# Patient Record
Sex: Female | Born: 1945 | Race: White | Hispanic: No | Smoking: Never smoker
Health system: Southern US, Community
[De-identification: ages and names within clinical notes are randomized; demographics above are authoritative.]

## PROBLEM LIST (undated history)

## (undated) DIAGNOSIS — I4819 Other persistent atrial fibrillation: Secondary | ICD-10-CM

## (undated) DIAGNOSIS — D539 Nutritional anemia, unspecified: Secondary | ICD-10-CM

## (undated) DIAGNOSIS — I10 Essential (primary) hypertension: Secondary | ICD-10-CM

## (undated) DIAGNOSIS — F419 Anxiety disorder, unspecified: Secondary | ICD-10-CM

## (undated) DIAGNOSIS — M48061 Spinal stenosis, lumbar region without neurogenic claudication: Secondary | ICD-10-CM

## (undated) DIAGNOSIS — I5189 Other ill-defined heart diseases: Secondary | ICD-10-CM

## (undated) DIAGNOSIS — E785 Hyperlipidemia, unspecified: Secondary | ICD-10-CM

## (undated) DIAGNOSIS — I499 Cardiac arrhythmia, unspecified: Secondary | ICD-10-CM

## (undated) DIAGNOSIS — E119 Type 2 diabetes mellitus without complications: Secondary | ICD-10-CM

## (undated) DIAGNOSIS — R339 Retention of urine, unspecified: Secondary | ICD-10-CM

## (undated) DIAGNOSIS — G4730 Sleep apnea, unspecified: Secondary | ICD-10-CM

## (undated) DIAGNOSIS — K219 Gastro-esophageal reflux disease without esophagitis: Secondary | ICD-10-CM

## (undated) DIAGNOSIS — I5032 Chronic diastolic (congestive) heart failure: Secondary | ICD-10-CM

## (undated) DIAGNOSIS — I4891 Unspecified atrial fibrillation: Secondary | ICD-10-CM

## (undated) HISTORY — DX: Hyperlipidemia, unspecified: E78.5

## (undated) HISTORY — PX: BREAST SURGERY: SHX581

## (undated) HISTORY — DX: Nutritional anemia, unspecified: D53.9

## (undated) HISTORY — PX: CHOLECYSTECTOMY: SHX55

## (undated) HISTORY — DX: Other ill-defined heart diseases: I51.89

## (undated) HISTORY — DX: Chronic diastolic (congestive) heart failure: I50.32

## (undated) HISTORY — DX: Essential (primary) hypertension: I10

## (undated) HISTORY — DX: Gastro-esophageal reflux disease without esophagitis: K21.9

## (undated) HISTORY — DX: Other persistent atrial fibrillation: I48.19

## (undated) HISTORY — DX: Anxiety disorder, unspecified: F41.9

## (undated) HISTORY — PX: APPENDECTOMY: SHX54

## (undated) HISTORY — DX: Type 2 diabetes mellitus without complications: E11.9

---

## 2019-04-13 DIAGNOSIS — G8929 Other chronic pain: Secondary | ICD-10-CM | POA: Diagnosis not present

## 2019-04-13 DIAGNOSIS — G629 Polyneuropathy, unspecified: Secondary | ICD-10-CM | POA: Diagnosis not present

## 2019-04-13 DIAGNOSIS — M549 Dorsalgia, unspecified: Secondary | ICD-10-CM | POA: Diagnosis not present

## 2019-04-13 DIAGNOSIS — M150 Primary generalized (osteo)arthritis: Secondary | ICD-10-CM | POA: Diagnosis not present

## 2019-08-20 DIAGNOSIS — E119 Type 2 diabetes mellitus without complications: Secondary | ICD-10-CM | POA: Diagnosis not present

## 2019-08-20 DIAGNOSIS — Z760 Encounter for issue of repeat prescription: Secondary | ICD-10-CM | POA: Diagnosis not present

## 2019-08-20 DIAGNOSIS — G629 Polyneuropathy, unspecified: Secondary | ICD-10-CM | POA: Diagnosis not present

## 2019-09-10 DIAGNOSIS — Z760 Encounter for issue of repeat prescription: Secondary | ICD-10-CM | POA: Diagnosis not present

## 2019-12-03 DIAGNOSIS — R110 Nausea: Secondary | ICD-10-CM | POA: Diagnosis not present

## 2019-12-03 DIAGNOSIS — R0902 Hypoxemia: Secondary | ICD-10-CM | POA: Diagnosis not present

## 2020-01-24 ENCOUNTER — Ambulatory Visit: Payer: Self-pay | Admitting: Nurse Practitioner

## 2020-02-15 ENCOUNTER — Other Ambulatory Visit: Payer: Self-pay

## 2020-02-15 ENCOUNTER — Encounter: Payer: Self-pay | Admitting: Nurse Practitioner

## 2020-02-15 ENCOUNTER — Ambulatory Visit (INDEPENDENT_AMBULATORY_CARE_PROVIDER_SITE_OTHER): Payer: Medicare PPO | Admitting: Nurse Practitioner

## 2020-02-15 VITALS — BP 140/72 | HR 71 | Temp 97.1°F | Ht 63.0 in | Wt 185.0 lb

## 2020-02-15 DIAGNOSIS — N3281 Overactive bladder: Secondary | ICD-10-CM | POA: Diagnosis not present

## 2020-02-15 DIAGNOSIS — E119 Type 2 diabetes mellitus without complications: Secondary | ICD-10-CM | POA: Diagnosis not present

## 2020-02-15 DIAGNOSIS — Z23 Encounter for immunization: Secondary | ICD-10-CM

## 2020-02-15 DIAGNOSIS — Z9889 Other specified postprocedural states: Secondary | ICD-10-CM

## 2020-02-15 LAB — BAYER DCA HB A1C WAIVED: HB A1C (BAYER DCA - WAIVED): 7.1 % — ABNORMAL HIGH (ref <7.0)

## 2020-02-15 MED ORDER — BLOOD GLUCOSE METER KIT: PACK | 0 refills | Status: DC

## 2020-02-15 MED ORDER — MIRABEGRON ER 25 MG PO TB24: 25.0000 mg | ORAL_TABLET | Freq: Every day | ORAL | 2 refills | Status: DC

## 2020-02-15 NOTE — Patient Instructions (Signed)
Overactive Bladder, Adult  Overactive bladder refers to a condition in which a person has a sudden need to pass urine. The person may leak urine if he or she cannot get to the bathroom fast enough (urinary incontinence). A person with this condition may also wake up several times in the night to go to the bathroom. Overactive bladder is associated with poor nerve signals between your bladder and your brain. Your bladder may get the signal to empty before it is full. You may also have very sensitive muscles that make your bladder squeeze too soon. These symptoms might interfere with daily work or social activities. What are the causes? This condition may be associated with or caused by:  Urinary tract infection.  Infection of nearby tissues, such as the prostate.  Prostate enlargement.  Surgery on the uterus or urethra.  Bladder stones, inflammation, or tumors.  Drinking too much caffeine or alcohol.  Certain medicines, especially medicines that get rid of extra fluid in the body (diuretics).  Muscle or nerve weakness, especially from: ? A spinal cord injury. ? Stroke. ? Multiple sclerosis. ? Parkinson's disease.  Diabetes.  Constipation. What increases the risk? You may be at greater risk for overactive bladder if you:  Are an older adult.  Smoke.  Are going through menopause.  Have prostate problems.  Have a neurological disease, such as stroke, dementia, Parkinson's disease, or multiple sclerosis (MS).  Eat or drink things that irritate the bladder. These include alcohol, spicy food, and caffeine.  Are overweight or obese. What are the signs or symptoms? Symptoms of this condition include:  Sudden, strong urge to urinate.  Leaking urine.  Urinating 8 or more times a day.  Waking up to urinate 2 or more times a night. How is this diagnosed? Your health care provider may suspect overactive bladder based on your symptoms. He or she will diagnose this condition  by:  A physical exam and medical history.  Blood or urine tests. You might need bladder or urine tests to help determine what is causing your overactive bladder. You might also need to see a health care provider who specializes in urinary tract problems (urologist). How is this treated? Treatment for overactive bladder depends on the cause of your condition and whether it is mild or severe. You can also make lifestyle changes at home. Options include:  Bladder training. This may include: ? Learning to control the urge to urinate by following a schedule that directs you to urinate at regular intervals (timed voiding). ? Doing Kegel exercises to strengthen your pelvic floor muscles, which support your bladder. Toning these muscles can help you control urination, even if your bladder muscles are overactive.  Special devices. This may include: ? Biofeedback, which uses sensors to help you become aware of your body's signals. ? Electrical stimulation, which uses electrodes placed inside the body (implanted) or outside the body. These electrodes send gentle pulses of electricity to strengthen the nerves or muscles that control the bladder. ? Women may use a plastic device that fits into the vagina and supports the bladder (pessary).  Medicines. ? Antibiotics to treat bladder infection. ? Antispasmodics to stop the bladder from releasing urine at the wrong time. ? Tricyclic antidepressants to relax bladder muscles. ? Injections of botulinum toxin type A directly into the bladder tissue to relax bladder muscles.  Lifestyle changes. This may include: ? Weight loss. Talk to your health care provider about weight loss methods that would work best for you. ?   Diet changes. This may include reducing how much alcohol and caffeine you consume, or drinking fluids at different times of the day. ? Not smoking. Do not use any products that contain nicotine or tobacco, such as cigarettes and e-cigarettes. If  you need help quitting, ask your health care provider.  Surgery. ? A device may be implanted to help manage the nerve signals that control urination. ? An electrode may be implanted to stimulate electrical signals in the bladder. ? A procedure may be done to change the shape of the bladder. This is done only in very severe cases. Follow these instructions at home: Lifestyle  Make any diet or lifestyle changes that are recommended by your health care provider. These may include: ? Drinking less fluid or drinking fluids at different times of the day. ? Cutting down on caffeine or alcohol. ? Doing Kegel exercises. ? Losing weight if needed. ? Eating a healthy and balanced diet to prevent constipation. This may include:  Eating foods that are high in fiber, such as fresh fruits and vegetables, whole grains, and beans.  Limiting foods that are high in fat and processed sugars, such as fried and sweet foods. General instructions  Take over-the-counter and prescription medicines only as told by your health care provider.  If you were prescribed an antibiotic medicine, take it as told by your health care provider. Do not stop taking the antibiotic even if you start to feel better.  Use any implants or pessary as told by your health care provider.  If needed, wear pads to absorb urine leakage.  Keep a journal or log to track how much and when you drink and when you feel the need to urinate. This will help your health care provider monitor your condition.  Keep all follow-up visits as told by your health care provider. This is important. Contact a health care provider if:  You have a fever.  Your symptoms do not get better with treatment.  Your pain and discomfort get worse.  You have more frequent urges to urinate. Get help right away if:  You are not able to control your bladder. Summary  Overactive bladder refers to a condition in which a person has a sudden need to pass  urine.  Several conditions may lead to an overactive bladder.  Treatment for overactive bladder depends on the cause and severity of your condition.  Follow your health care provider's instructions about lifestyle changes, doing Kegel exercises, keeping a journal, and taking medicines. This information is not intended to replace advice given to you by your health care provider. Make sure you discuss any questions you have with your health care provider. Document Revised: 06/18/2018 Document Reviewed: 03/13/2017 Elsevier Patient Education  [place]. Diabetes Basics  Diabetes (diabetes mellitus) is a long-term (chronic) disease. It occurs when the body does not properly use sugar (glucose) that is released from food after you eat. Diabetes may be caused by one or both of these problems:  Your pancreas does not make enough of a hormone called insulin.  Your body does not react in a normal way to insulin that it makes. Insulin lets sugars (glucose) go into cells in your body. This gives you energy. If you have diabetes, sugars cannot get into cells. This causes high blood sugar (hyperglycemia). Follow these instructions at home: How is diabetes treated? You may need to take insulin or other diabetes medicines daily to keep your blood sugar in balance. Take your diabetes medicines every  day as told by your doctor. List your diabetes medicines here: Diabetes medicines  Name of medicine: ______________________________ ? Amount (dose): _______________ Time (a.m./p.m.): _______________ Notes: ___________________________________  Name of medicine: ______________________________ ? Amount (dose): _______________ Time (a.m./p.m.): _______________ Notes: ___________________________________  Name of medicine: ______________________________ ? Amount (dose): _______________ Time (a.m./p.m.): _______________ Notes: ___________________________________ If you use insulin, you will learn how to  give yourself insulin by injection. You may need to adjust the amount based on the food that you eat. List the types of insulin you use here: Insulin  Insulin type: ______________________________ ? Amount (dose): _______________ Time (a.m./p.m.): _______________ Notes: ___________________________________  Insulin type: ______________________________ ? Amount (dose): _______________ Time (a.m./p.m.): _______________ Notes: ___________________________________  Insulin type: ______________________________ ? Amount (dose): _______________ Time (a.m./p.m.): _______________ Notes: ___________________________________  Insulin type: ______________________________ ? Amount (dose): _______________ Time (a.m./p.m.): _______________ Notes: ___________________________________  Insulin type: ______________________________ ? Amount (dose): _______________ Time (a.m./p.m.): _______________ Notes: ___________________________________ How do I manage my blood sugar?  Check your blood sugar levels using a blood glucose monitor as directed by your doctor. Your doctor will set treatment goals for you. Generally, you should have these blood sugar levels:  Before meals (preprandial): 80-130 mg/dL (4.4-7.2 mmol/L).  After meals (postprandial): below 180 mg/dL (10 mmol/L).  A1c level: less than 7%. Write down the times that you will check your blood sugar levels: Blood sugar checks  Time: _______________ Notes: ___________________________________  Time: _______________ Notes: ___________________________________  Time: _______________ Notes: ___________________________________  Time: _______________ Notes: ___________________________________  Time: _______________ Notes: ___________________________________  Time: _______________ Notes: ___________________________________  What do I need to know about low blood sugar? Low blood sugar is called hypoglycemia. This is when blood sugar is at or below 70  mg/dL (3.9 mmol/L). Symptoms may include:  Feeling: ? Hungry. ? Worried or nervous (anxious). ? Sweaty and clammy. ? Confused. ? Dizzy. ? Sleepy. ? Sick to your stomach (nauseous).  Having: ? A fast heartbeat. ? A headache. ? A change in your vision. ? Tingling or no feeling (numbness) around the mouth, lips, or tongue. ? Jerky movements that you cannot control (seizure).  Having trouble with: ? Moving (coordination). ? Sleeping. ? Passing out (fainting). ? Getting upset easily (irritability). Treating low blood sugar To treat low blood sugar, eat or drink something sugary right away. If you can think clearly and swallow safely, follow the 15:15 rule:  Take 15 grams of a fast-acting carb (carbohydrate). Talk with your doctor about how much you should take.  Some fast-acting carbs are: ? Sugar tablets (glucose pills). Take 3-4 glucose pills. ? 6-8 pieces of hard candy. ? 4-6 oz (120-150 mL) of fruit juice. ? 4-6 oz (120-150 mL) of regular (not diet) soda. ? 1 Tbsp (15 mL) honey or sugar.  Check your blood sugar 15 minutes after you take the carb.  If your blood sugar is still at or below 70 mg/dL (3.9 mmol/L), take 15 grams of a carb again.  If your blood sugar does not go above 70 mg/dL (3.9 mmol/L) after 3 tries, get help right away.  After your blood sugar goes back to normal, eat a meal or a snack within 1 hour. Treating very low blood sugar If your blood sugar is at or below 54 mg/dL (3 mmol/L), you have very low blood sugar (severe hypoglycemia). This is an emergency. Do not wait to see if the symptoms will go away. Get medical help right away. Call your local emergency services (911 in the [place] Do not drive yourself to  the hospital. Questions to ask your health care provider  Do I need to meet with a diabetes educator?  What equipment will I need to care for myself at home?  What diabetes medicines do I need? When should I take them?  How often do I need to  check my blood sugar?  What number can I call if I have questions?  When is my next doctor's visit?  Where can I find a support group for people with diabetes? Where to find more information  American Diabetes Association: www.diabetes.org  American Association of Diabetes Educators: www.diabeteseducator.org/patient-resources Contact a doctor if:  Your blood sugar is at or above 240 mg/dL (13.3 mmol/L) for 2 days in a row.  You have been sick or have had a fever for 2 days or more, and you are not getting better.  You have any of these problems for more than 6 hours: ? You cannot eat or drink. ? You feel sick to your stomach (nauseous). ? You throw up (vomit). ? You have watery poop (diarrhea). Get help right away if:  Your blood sugar is lower than 54 mg/dL (3 mmol/L).  You get confused.  You have trouble: ? Thinking clearly. ? Breathing. Summary  Diabetes (diabetes mellitus) is a long-term (chronic) disease. It occurs when the body does not properly use sugar (glucose) that is released from food after digestion.  Take insulin and diabetes medicines as told.  Check your blood sugar every day, as often as told.  Keep all follow-up visits as told by your doctor. This is important. This information is not intended to replace advice given to you by your health care provider. Make sure you discuss any questions you have with your health care provider. Document Revised: 11/18/2018 Document Reviewed: 05/30/2017 Elsevier Patient Education  [place].

## 2020-02-15 NOTE — Assessment & Plan Note (Signed)
Not controlled on current regimen.  Patient is lethargic with generalized weakness on presentation today.  Patient is on [place].  Help patient is administering medication is not quite understood.  Patient newly [place] a different state.  Patient used to live in a nursing home but now lives with daughter and have to manage medication by herself.  A1c 7.1.  Provided education to patient on medication management.   Patient will follow up in 3 months.

## 2020-02-15 NOTE — Progress Notes (Signed)
New Patient Note  RE: Krystal Ali MRN: [id number] DOB: 08/15/1945 Date of Office Visit: 02/15/2020  Chief Complaint: New Patient (Initial Visit)  History of Present Illness:  The patient presents with history of type 2 diabetes mellitus without complications. Patient was diagnosed few years ago. Compliance with treatment has been fair; the patient takes medication as directed , maintains a diabetic diet and able to exercise due to bilateral lower extremity weakness., follows up as directed , and is keeping a glucose diary.  Patient specifically denies associated symptoms, including blurred vision, fatigue, polydipsia, polyphagia and polyuria . Patient denies hypoglycemia. In regard to preventative care, the patient performs foot self-exams daily and last ophthalmology exam was in : We will schedule an appointment   Concerning patient's overactive bladder.  Patient is reporting lack of control and having more accidents.  Patient has not had medication in the last few months.  Patient is new to the area relocating from another state and has not completely quoted all her medication.  Patient denies dysuria, foul smelling urine, pelvic pain, nausea or vomiting.  Current medication Myrbetriq 25 mg tablet daily.    Assessment and Plan: Krystal Ali is a 74 y.o. female with: Diabetes mellitus without complication ([place]) Not controlled on current regimen.  Patient is lethargic with generalized weakness on presentation today.  Patient is on [place].  Help patient is administering medication is not quite understood.  Patient newly relocated from a different state.  Patient used to live in a nursing home but now lives with daughter and have to manage medication by herself.  A1c 7.1.  Provided education to patient on medication management.   Patient will follow up in 3 months.  Overactive bladder This is not new for patient.  Overactive bladder not well managed.  Really filled Myrbetriq.  Rx  sent to pharmacy provided education to patient with printed handouts given.  Follow-up with worsening or unresolved symptoms.  Return in about 3 weeks (around 03/07/2020) for [place] work.   Diagnostics:   Past Medical History: Patient Active Problem List   Diagnosis Date Noted  . Overactive bladder 02/15/2020  . Diabetes mellitus without complication ([place]) 89/21/1940  . History of back surgery 02/15/2020   Past Medical History:  Diagnosis Date  . Anxiety   . Diabetes mellitus without complication ([place])   . GERD (gastroesophageal reflux disease)   . Hyperlipidemia   . Hypertension    Past Surgical History: Past Surgical History:  Procedure Laterality Date  . APPENDECTOMY    . BREAST SURGERY    . CHOLECYSTECTOMY     Medication List:  Current Outpatient Medications  Medication Sig Dispense Refill  . amLODipine (NORVASC) 5 MG tablet Take 5 mg by mouth daily.    Marland Kitchen atorvastatin (LIPITOR) 20 MG tablet Take 20 mg by mouth daily.    . cetirizine (ZYRTEC) 10 MG tablet Take 10 mg by mouth daily.    . fluticasone (FLONASE) 50 MCG/ACT nasal spray Place into both nostrils.    Marland Kitchen JANUMET XR [phone] MG TB24 Take 1 tablet by mouth daily.    Marland Kitchen OZEMPIC, 0.25 OR 0.5 MG/DOSE, 2 MG/1.5ML SOPN Inject into the skin.    Marland Kitchen venlafaxine XR (EFFEXOR-XR) 150 MG 24 hr capsule      No current facility-administered medications for this visit.   Allergies: Allergies  Allergen Reactions  . Morphine And Related    Social History: Social History   Socioeconomic History  . Marital status: Divorced  Spouse name: Not on file  . Number of children: Not on file  . Years of education: Not on file  . Highest education level: Not on file  Occupational History  . Not on file  Tobacco Use  . Smoking status: Never Smoker  . Smokeless tobacco: Never Used  Substance and Sexual Activity  . Alcohol use: Not Currently  . Drug use: Never  . Sexual activity: Not Currently  Other Topics Concern  .  Not on file  Social History Narrative  . Not on file   Social Determinants of Health   Financial Resource Strain:   . Difficulty of Paying Living Expenses: Not on file  Food Insecurity:   . Worried About Charity fundraiser in the Last Year: Not on file  . Ran Out of Food in the Last Year: Not on file  Transportation Needs:   . Lack of Transportation (Medical): Not on file  . Lack of Transportation (Non-Medical): Not on file  Physical Activity:   . Days of Exercise per Week: Not on file  . Minutes of Exercise per Session: Not on file  Stress:   . Feeling of Stress : Not on file  Social Connections:   . Frequency of Communication with Friends and Family: Not on file  . Frequency of Social Gatherings with Friends and Family: Not on file  . Attends Religious Services: Not on file  . Active Member of Clubs or Organizations: Not on file  . Attends Archivist Meetings: Not on file  . Marital Status: Not on file       Family History: Family History  Problem Relation Age of Onset  . Heart disease Father   . COPD Brother           Review of Systems  Respiratory: Negative for cough, chest tightness and shortness of breath.   Cardiovascular: Positive for leg swelling.  Musculoskeletal: Positive for back pain.  Neurological: Positive for weakness. Negative for light-headedness.  Psychiatric/Behavioral: Negative for self-injury. The patient is nervous/anxious.   All other systems reviewed and are negative.  Objective: BP 140/72   Pulse 71   Temp (!) 97.1 F (36.2 C) (Temporal)   Ht 5\' 3"  (1.6 m)   Wt 185 lb (83.9 kg)   SpO2 97%   BMI 32.77 kg/m  Body mass index is 32.77 kg/m. Physical Exam Vitals reviewed. Exam conducted with a chaperone present (Daughter).  Constitutional:      General: She is awake.     Appearance: She is overweight.  HENT:     Nose: Nose normal.  Eyes:     Conjunctiva/sclera: Conjunctivae normal.  Cardiovascular:     Rate and  Rhythm: Normal rate and regular rhythm.     Pulses: Normal pulses.     Heart sounds: Normal heart sounds.  Pulmonary:     Effort: Pulmonary effort is normal.     Breath sounds: Normal breath sounds.  Abdominal:     General: Bowel sounds are normal.  Genitourinary:    Comments: Overactive bladder Musculoskeletal:        General: Swelling and tenderness present.     Comments: Bilateral lower extremity  Skin:    General: Skin is warm.  Neurological:     Mental Status: She is alert and oriented to person, place, and time.  Psychiatric:        Mood and Affect: Mood normal.        Behavior: Behavior normal. Behavior is cooperative.  The plan was reviewed with the patient/family, and all questions/concerned were addressed.  It was my pleasure to see Krystal Ali today and participate in her care. Please feel free to contact me with any questions or concerns.  Sincerely,  Jac Canavan NP [place]

## 2020-02-15 NOTE — Assessment & Plan Note (Signed)
This is not new for patient.  Overactive bladder not well managed.  Really filled Myrbetriq.  Rx sent to pharmacy provided education to patient with printed handouts given.  Follow-up with worsening or unresolved symptoms.

## 2020-02-18 NOTE — Addendum Note (Signed)
Addended by: [name] on: 02/18/2020 11:31 PM   Modules accepted: Level of Service

## 2020-02-18 NOTE — Progress Notes (Signed)
Corrections made, let me know if anything else needs to be added thank you

## 2020-02-22 ENCOUNTER — Other Ambulatory Visit: Payer: Self-pay | Admitting: Nurse Practitioner

## 2020-02-22 ENCOUNTER — Telehealth: Payer: Self-pay

## 2020-02-22 DIAGNOSIS — Z9889 Other specified postprocedural states: Secondary | ICD-10-CM

## 2020-02-22 NOTE — Telephone Encounter (Signed)
I put in a DME order for walker.  Thank you

## 2020-02-22 NOTE — Telephone Encounter (Signed)
Je, I see an order for a wheelchair but I do not seeing anything for a walker.  Am I missing something?  Can you do an order for a walker?

## 2020-02-23 ENCOUNTER — Telehealth: Payer: Self-pay

## 2020-02-23 NOTE — Telephone Encounter (Signed)
Will pickup order for walker Pt also mentioned that she uses incontinence supplies through [facility] and needs a prescription for this as well, did see mention of overactive bladder during visit but not use of supplies, this will need to be noted for order in case notes are requested

## 2020-02-23 NOTE — Telephone Encounter (Signed)
We did not have this converstion

## 2020-02-25 ENCOUNTER — Other Ambulatory Visit: Payer: Self-pay | Admitting: Family Medicine

## 2020-02-25 ENCOUNTER — Other Ambulatory Visit: Payer: Self-pay | Admitting: *Deleted

## 2020-02-25 DIAGNOSIS — R531 Weakness: Secondary | ICD-10-CM

## 2020-02-25 DIAGNOSIS — Z9889 Other specified postprocedural states: Secondary | ICD-10-CM

## 2020-02-25 NOTE — Telephone Encounter (Signed)
Patient's daughter called stating that patient needs a written rx for wheelchair with elevated leg rest.  Patient recently received rx for walker but patient can not walk.

## 2020-02-25 NOTE — Telephone Encounter (Signed)
Called and aware - ready - will fax to [facility]

## 2020-02-29 NOTE — Telephone Encounter (Signed)
NA/NVM pt will need to discuss supplies during next OV

## 2020-03-08 ENCOUNTER — Telehealth: Payer: Self-pay

## 2020-03-08 NOTE — Telephone Encounter (Signed)
Je has written prescriptions for wheelchair and walker. Dx [phone]. Pt wished to come by and pick them up.  Left message informing pt that the prescriptions were ready and to come by M-F between 8-4:30. Made aware that we are closed this Friday for New Year's.

## 2020-03-09 ENCOUNTER — Encounter: Payer: Medicare PPO | Admitting: Nurse Practitioner

## 2020-03-14 ENCOUNTER — Encounter: Payer: Self-pay | Admitting: Nurse Practitioner

## 2020-04-27 ENCOUNTER — Emergency Department (HOSPITAL_BASED_OUTPATIENT_CLINIC_OR_DEPARTMENT_OTHER): Payer: Medicare PPO

## 2020-04-27 ENCOUNTER — Other Ambulatory Visit: Payer: Self-pay

## 2020-04-27 ENCOUNTER — Emergency Department (HOSPITAL_BASED_OUTPATIENT_CLINIC_OR_DEPARTMENT_OTHER)
Admission: EM | Admit: 2020-04-27 | Discharge: 2020-04-27 | Disposition: A | Discharge status: Home / Self Care | Payer: Medicare PPO | Attending: Emergency Medicine | Admitting: Emergency Medicine

## 2020-04-27 ENCOUNTER — Encounter (HOSPITAL_BASED_OUTPATIENT_CLINIC_OR_DEPARTMENT_OTHER): Payer: Self-pay | Admitting: *Deleted

## 2020-04-27 DIAGNOSIS — Z79899 Other long term (current) drug therapy: Secondary | ICD-10-CM | POA: Insufficient documentation

## 2020-04-27 DIAGNOSIS — I10 Essential (primary) hypertension: Secondary | ICD-10-CM | POA: Diagnosis not present

## 2020-04-27 DIAGNOSIS — M79605 Pain in left leg: Secondary | ICD-10-CM | POA: Insufficient documentation

## 2020-04-27 DIAGNOSIS — M5416 Radiculopathy, lumbar region: Secondary | ICD-10-CM | POA: Diagnosis not present

## 2020-04-27 DIAGNOSIS — M545 Low back pain, unspecified: Secondary | ICD-10-CM | POA: Diagnosis present

## 2020-04-27 DIAGNOSIS — E119 Type 2 diabetes mellitus without complications: Secondary | ICD-10-CM | POA: Diagnosis not present

## 2020-04-27 DIAGNOSIS — M25552 Pain in left hip: Secondary | ICD-10-CM | POA: Diagnosis not present

## 2020-04-27 DIAGNOSIS — K5900 Constipation, unspecified: Secondary | ICD-10-CM | POA: Diagnosis not present

## 2020-04-27 DIAGNOSIS — R197 Diarrhea, unspecified: Secondary | ICD-10-CM | POA: Diagnosis not present

## 2020-04-27 LAB — CBC WITH DIFFERENTIAL/PLATELET
Abs Immature Granulocytes: 0.03 10*3/uL (ref 0.00–0.07)
Basophils Absolute: 0 10*3/uL (ref 0.0–0.1)
Basophils Relative: 0 %
Eosinophils Absolute: 0 10*3/uL (ref 0.0–0.5)
Eosinophils Relative: 0 %
HCT: 34.5 % — ABNORMAL LOW (ref 36.0–46.0)
Hemoglobin: 11.7 g/dL — ABNORMAL LOW (ref 12.0–15.0)
Immature Granulocytes: 0 %
Lymphocytes Relative: 8 %
Lymphs Abs: 0.7 10*3/uL (ref 0.7–4.0)
MCH: 32 pg (ref 26.0–34.0)
MCHC: 33.9 g/dL (ref 30.0–36.0)
MCV: 94.3 fL (ref 80.0–100.0)
Monocytes Absolute: 0.4 10*3/uL (ref 0.1–1.0)
Monocytes Relative: 4 %
Neutro Abs: 7.9 10*3/uL — ABNORMAL HIGH (ref 1.7–7.7)
Neutrophils Relative %: 88 %
Platelets: 250 10*3/uL (ref 150–400)
RBC: 3.66 MIL/uL — ABNORMAL LOW (ref 3.87–5.11)
RDW: 12.2 % (ref 11.5–15.5)
WBC: 9 10*3/uL (ref 4.0–10.5)
nRBC: 0 % (ref 0.0–0.2)

## 2020-04-27 LAB — BASIC METABOLIC PANEL
Anion gap: 9 (ref 5–15)
BUN: 26 mg/dL — ABNORMAL HIGH (ref 8–23)
CO2: 24 mmol/L (ref 22–32)
Calcium: 8.7 mg/dL — ABNORMAL LOW (ref 8.9–10.3)
Chloride: 102 mmol/L (ref 98–111)
Creatinine, Ser: 0.69 mg/dL (ref 0.44–1.00)
GFR, Estimated: >60 mL/min (ref >60)
Glucose, Bld: 183 mg/dL — ABNORMAL HIGH (ref 70–99)
Potassium: 3.7 mmol/L (ref 3.5–5.1)
Sodium: 135 mmol/L (ref 135–145)

## 2020-04-27 MED ORDER — FENTANYL CITRATE (PF) 100 MCG/2ML IJ SOLN
50.0000 ug | Freq: Once | INTRAMUSCULAR | Status: AC
Start: 2020-04-27 — End: 2020-04-27
  Administered 2020-04-27: 50 ug via INTRAVENOUS
  Filled 2020-04-27: qty 2

## 2020-04-27 MED ORDER — HYDROCODONE-ACETAMINOPHEN 5-325 MG PO TABS: 1.0000 | ORAL_TABLET | Freq: Four times a day (QID) | ORAL | 0 refills | Status: DC | PRN

## 2020-04-27 MED ORDER — ONDANSETRON HCL 4 MG/2ML IJ SOLN
4.0000 mg | Freq: Once | INTRAMUSCULAR | Status: AC
  Administered 2020-04-27: 4 mg via INTRAVENOUS
  Filled 2020-04-27: qty 2

## 2020-04-27 MED ORDER — METHYLPREDNISOLONE 4 MG PO TBPK: ORAL_TABLET | ORAL | 0 refills | Status: DC

## 2020-04-27 NOTE — ED Notes (Signed)
Pt unable to provide urine at this time

## 2020-04-27 NOTE — ED Triage Notes (Signed)
Left hip pain x 5 days. No known injury. 6 months ago she fell and broke her pelvis.

## 2020-04-27 NOTE — ED Provider Notes (Signed)
[facility] EMERGENCY DEPARTMENT Provider Note   CSN: [id number] Arrival date & time: 04/27/20  1509     History Chief Complaint  Patient presents with  . Hip Pain    Krystal Ali is a 75 y.o. female past medical history of type 2 diabetes, hypertension, spinal fusion surgery, presenting the emergency department with complaint of left-sided low back pain radiating down her left leg.  She states pain began about 5 days ago.  It is in her low back favoring the left side.  The radiating pain goes to the posterior aspect of her leg down towards her foot.  She gets intermittent shooting pains down her leg.  She is not having any new numbness or weakness in her extremities, no new bowel or bladder incontinence or new saddle paresthesias.  She does report baseline diabetic neuropathy which is unchanged.  She has had no injuries.  She states she is not ambulatory at baseline, however she will stand next to her bed and perform strengthening exercises.  She does not recall any particular activity or movement which caused pain within the last week.  She had spinal fusion surgery many years ago in [place], does not have a spine surgeon here.  Has not taken any over-the-counter medications for pain as she states it does not work.  Vomiting was noted in her arrival complaint, however patient denies any nausea or vomiting.  She reports chronic alternating between diarrhea and constipation which is unchanged from her baseline.  No abdominal pain.  No fever  The history is provided by the patient.       Past Medical History:  Diagnosis Date  . Anxiety   . Diabetes mellitus without complication (HCC)   . GERD (gastroesophageal reflux disease)   . Hyperlipidemia   . Hypertension     Patient Active Problem List   Diagnosis Date Noted  . Overactive bladder 02/15/2020  . Diabetes mellitus without complication (HCC) 02/15/2020  . History of back surgery 02/15/2020    Past Surgical  History:  Procedure Laterality Date  . APPENDECTOMY    . BREAST SURGERY    . CHOLECYSTECTOMY       OB History   No obstetric history on file.     Family History  Problem Relation Age of Onset  . Heart disease Father   . COPD Brother     Social History   Tobacco Use  . Smoking status: Never Smoker  . Smokeless tobacco: Never Used  Substance Use Topics  . Alcohol use: Not Currently  . Drug use: Never    Home Medications Prior to Admission medications   Medication Sig Start Date End Date Taking? Authorizing Provider  HYDROcodone-acetaminophen (NORCO/VICODIN) 5-325 MG tablet Take 1-2 tablets by mouth every 6 (six) hours as needed for severe pain. 04/27/20  Yes Robinson, Jordan N, PA-C  methylPREDNISolone (MEDROL DOSEPAK) 4 MG TBPK tablet Take as directed on packaging. 04/27/20  Yes Robinson, Jordan N, PA-C  amLODipine (NORVASC) 5 MG tablet Take 5 mg by mouth daily. 02/09/20   [provider]  atorvastatin (LIPITOR) 20 MG tablet Take 20 mg by mouth daily. 02/09/20   [provider]  blood glucose meter kit and supplies Dispense based on patient and insurance preference. Use up to four times daily as directed. (FOR ICD-10 E10.9, E11.9). 02/15/20   Ijaola, Onyeje M, NP  cetirizine (ZYRTEC) 10 MG tablet Take 10 mg by mouth daily. 02/09/20   [provider]  fluticasone (FLONASE) 50   MCG/ACT nasal spray Place into both nostrils. 02/09/20   [provider]  JANUMET XR 100-1000 MG TB24 Take 1 tablet by mouth daily. 02/09/20   [provider]  mirabegron ER (MYRBETRIQ) 25 MG TB24 tablet Take 1 tablet (25 mg total) by mouth daily. 02/15/20   Ijaola, Onyeje M, NP  OZEMPIC, 0.25 OR 0.5 MG/DOSE, 2 MG/1.5ML SOPN Inject into the skin. 02/09/20   [provider]  venlafaxine XR (EFFEXOR-XR) 150 MG 24 hr capsule  01/04/20   [provider]    Allergies    Morphine and related  Review of Systems   Review of Systems  Musculoskeletal:  Positive for back pain.  All other systems reviewed and are negative.   Physical Exam Updated Vital Signs BP 138/66   Pulse 81   Temp 98.1 F (36.7 C) (Oral)   Resp 16   Ht 5' 3" (1.6 m)   Wt 83.9 kg   SpO2 96%   BMI 32.77 kg/m   Physical Exam Vitals and nursing note reviewed.  Constitutional:      General: She is not in acute distress.    Appearance: She is well-developed and well-nourished.  HENT:     Head: Normocephalic and atraumatic.  Eyes:     Conjunctiva/sclera: Conjunctivae normal.  Cardiovascular:     Rate and Rhythm: Normal rate and regular rhythm.  Pulmonary:     Effort: Pulmonary effort is normal. No respiratory distress.     Breath sounds: Normal breath sounds.  Abdominal:     General: Bowel sounds are normal.     Palpations: Abdomen is soft.     Tenderness: There is no abdominal tenderness.  Musculoskeletal:     Comments: Midline surgical scar to the spine.  She has lower lumbar midline tenderness.  Palpation of this region causes shooting pain down the left leg.  No skin changes.  Paraspinal musculature tenderness is also present.  Positive straight leg raise bilaterally  Skin:    General: Skin is warm.  Neurological:     Mental Status: She is alert.     Comments: Normal tone.  strong and equal dorsiflexion/plantar flexion to bilateral lower extremities Sensory:  light touch equal and at baseline in BLE extremities.   Gait: Deferred as patient is not ambulatory at baseline. CV: distal pulses palpable throughout    Psychiatric:        Mood and Affect: Mood and affect normal.        Behavior: Behavior normal.     ED Results / Procedures / Treatments   Labs (all labs ordered are listed, but only abnormal results are displayed) Labs Reviewed  CBC WITH DIFFERENTIAL/PLATELET - Abnormal; Notable for the following components:      Result Value   RBC 3.66 (*)    Hemoglobin 11.7 (*)    HCT 34.5 (*)    Neutro Abs 7.9 (*)    All other components  within normal limits  BASIC METABOLIC PANEL - Abnormal; Notable for the following components:   Glucose, Bld 183 (*)    BUN 26 (*)    Calcium 8.7 (*)    All other components within normal limits    EKG None  Radiology CT Lumbar Spine Wo Contrast  Result Date: 04/27/2020 CLINICAL DATA:  Left hip pain over the last 5 days. EXAM: CT LUMBAR SPINE WITHOUT CONTRAST TECHNIQUE: Multidetector CT imaging of the lumbar spine was performed without intravenous contrast administration. Multiplanar CT image reconstructions were also generated. COMPARISON:    None. FINDINGS: Segmentation: 5 lumbar type vertebral bodies assumed. Alignment: No malalignment in the region from T11 to the sacrum. Vertebrae: Previous fusion extending from at least T11 to the L4 level. Corpectomy with strut graft with graft extending from inferior T12-L3. Pedicle screws bilateral at T11, T12, L3 and L4 and right-sided at L1 and L2. No evidence of hardware failure or loosening. Paraspinal and other soft tissues: Ordinary aortic atherosclerotic calcification without aneurysm. Disc levels: Solid fusion from T11 through L4. Apparent sufficient patency of the canal and foramina throughout that region. L4-5: This is a mobile level. The disc is degenerated with vacuum phenomenon and circumferential protrusion. There is bilateral facet arthropathy. There is multifactorial spinal stenosis at this level that could cause neural compression on either or both sides. L5-S1: Chronic fusion is solid. Sufficient patency of the canal and foramina. IMPRESSION: 1. Solid fusion from at least T11 through L4. Corpectomy with strut graft with graft extending from inferior T12-L3. No evidence of hardware failure or loosening. Wide patency of the canal and foramina through that region. 2. Mobile segment degenerative disease at L4-5 with multifactorial spinal stenosis and foraminal stenosis that could cause neural compression on either or both sides. 3. Solid fusion  L5-S1 without stenosis. Aortic Atherosclerosis (ICD10-I70.0). Electronically Signed   By: Nelson Chimes M.D.   On: 04/27/2020 17:18   DG Hip Unilat With Pelvis 2-3 Views Left  Result Date: 04/27/2020 CLINICAL DATA:  Pain and decreased range of motion EXAM: DG HIP (WITH OR WITHOUT PELVIS) 2-3V LEFT COMPARISON:  None. FINDINGS: Frontal pelvis as well as frontal and lateral left hip images were obtained. Bones are osteoporotic. No acute appearing fracture or dislocation. There is evidence of an old healed fracture of the medial left ischium with remodeling. There is mild narrowing of each hip joint, symmetric. There is postoperative change in the visualized mid to lower lumbar spine. There are foci arterial vascular calcification in each superficial femoral artery. IMPRESSION: Bones osteoporotic. Apparent old healed fracture medial left ischium. No acute fracture or dislocation. Symmetric fairly mild narrowing of each hip joint. Electronically Signed   By: Lowella Grip III M.D.   On: 04/27/2020 16:33    Procedures Procedures   Medications Ordered in ED Medications  fentaNYL (SUBLIMAZE) injection 50 mcg (50 mcg Intravenous Given 04/27/20 1624)  ondansetron (ZOFRAN) injection 4 mg (4 mg Intravenous Given 04/27/20 1624)    ED Course  I have reviewed the triage vital signs and the nursing notes.  Pertinent labs & imaging results that were available during my care of the patient were reviewed by me and considered in my medical decision making (see chart for details).    MDM Rules/Calculators/A&P                          Patient with lower back pain and left-sided radiculopathy.  History of multiple disc protrusions and spinal f[place].  She has no new neurologic symptoms or deficits on exam.  Plain film of the hips was ordered in triage and shows some mild narrowing of each hip joint though no acute findings.   CT scan obtained which revealed degenerative disease at L4-5  with spinal and foraminal stenosis, mention to potentially cause neural compression bilaterally.  This finding is discussed with attending physician Dr. Ronnald Nian.  Patient's presentation seems most consistent with these findings.  No concern for cord compression.  Her symptoms are greatly improved with medications here in  the ED.  She feels comfortable discharge to home with oral medications.  Will provide Medrol Dosepak for inflammation as well as Norco for breakthrough pain.  Discussed the possibility of the methylprednisolone raising her blood sugars.  She verbalized understanding this and is encouraged to frequently check her blood sugars daily and discontinue if blood sugars persistently high.  Provided neurosurgery referral for follow-up as needed, however in the interim instructed to follow with PCP.  Should return precautions discussed.  Patient is discharged in no acute distress, and feels comfortable with plan for discharge.  Discussed results, findings, treatment and follow up. Patient advised of return precautions. Patient verbalized understanding and agreed with plan.  Final Clinical Impression(s) / ED Diagnoses Final diagnoses:  Lumbar radiculopathy, acute    Rx / DC Orders ED Discharge Orders         Ordered    HYDROcodone-acetaminophen (NORCO/VICODIN) 5-325 MG tablet  Every 6 hours PRN        04/27/20 1840    methylPREDNISolone (MEDROL DOSEPAK) 4 MG TBPK tablet        04/27/20 1840           Robinson, Jordan N, PA-C 04/27/20 1848    Curatolo, Adam, DO 04/27/20 1938  

## 2020-04-27 NOTE — Discharge Instructions (Addendum)
Please follow with your primary care provider regarding your visit today. You can schedule an appointment with the spine specialists as needed. Take the steroid to help with inflammation in your back.  However it is important to monitor your blood sugars while taking this as it can raise your sugars.  If your blood sugar is too high discontinue this medication. You can take over-the-counter medication as well as topical Voltaren (diclofenac gel) for symptom relief. For breakthrough or more severe pain, you can take the hydrocodone every 6 hours.  Be aware this medication can make you drowsy. Plan ice to your back for 20 minutes at a time. Return for new numbness or weakness in your legs, new bowel or bladder incontinence, or new numbness on your bottom.

## 2020-04-28 ENCOUNTER — Telehealth: Payer: Self-pay | Admitting: *Deleted

## 2020-04-28 ENCOUNTER — Telehealth: Payer: Self-pay

## 2020-04-28 ENCOUNTER — Other Ambulatory Visit (INDEPENDENT_AMBULATORY_CARE_PROVIDER_SITE_OTHER): Payer: Medicare PPO | Admitting: Nurse Practitioner

## 2020-04-28 ENCOUNTER — Other Ambulatory Visit: Payer: Self-pay | Admitting: Nurse Practitioner

## 2020-04-28 DIAGNOSIS — M549 Dorsalgia, unspecified: Secondary | ICD-10-CM

## 2020-04-28 MED ORDER — LIDOCAINE 5 % EX PTCH: 1.0000 | MEDICATED_PATCH | CUTANEOUS | 0 refills | Status: DC

## 2020-04-28 NOTE — Telephone Encounter (Signed)
Lidocaine patch sent to [facility], referral to neuro surgery completed. follow up office visit Monday if symptoms worsens or unresolved

## 2020-04-28 NOTE — Telephone Encounter (Signed)
Thank you , I will call patient now, but unfortunately it is the responsibility of family to bring patients in for a visit if they are needing Narcotic. I don't have a contract and no patient assessment. and nothing to work with.

## 2020-04-28 NOTE — Telephone Encounter (Signed)
Patients daughter, Krystal Ali, is calling to see what to do for her mother Krystal Ali.  Mom was seen at [facility] yesterday and had a CT Scan of her back.  In excruiating pain and cannot walk. Was given 8 pain pills and steroids, but is afraid she will not be able to make it through the weekend like this.  [facility] said PCP will need to look at scan and notify patient on what results are.  She needs to follow up with [place], but is unable to walk.  Daughter does not know what to do.  Can someone call and talk to her and help her with this situation.

## 2020-04-28 NOTE — Telephone Encounter (Signed)
Drug Lidocaine 5% patches Key: [id number]  Sent to plan

## 2020-04-28 NOTE — Progress Notes (Signed)
Worsening uncontrolled pain in the last 5 days.  Started patient on lidocaine patches.  Follow-up office visit Monday.

## 2020-04-28 NOTE — Telephone Encounter (Signed)
Real quick before I call patient back, is she wanting more pain medication due to having only 8 pills or wanting a provider to explain the CT results.  I have read the results and from my understanding this is not new and her not walking is a base line, I would like to re-evaluate patient and send her a referral to Neuro surgery, looks like she had  a back (fusion surgery) few years ago in [place].   I do not mind calling patient at all , I just wanted clarification of their needs.   Thank you.

## 2020-04-28 NOTE — Telephone Encounter (Signed)
Per daughter patient will not have enough pain medication through the weekend and needs Ct scan reviewed as well. States would be very difficult to get her into the office.

## 2020-05-01 ENCOUNTER — Encounter: Payer: Self-pay | Admitting: Nurse Practitioner

## 2020-05-01 ENCOUNTER — Ambulatory Visit (INDEPENDENT_AMBULATORY_CARE_PROVIDER_SITE_OTHER): Payer: Medicare PPO | Admitting: Nurse Practitioner

## 2020-05-01 ENCOUNTER — Other Ambulatory Visit: Payer: Self-pay

## 2020-05-01 VITALS — BP 158/77 | HR 81 | Temp 98.0°F | Ht 63.0 in

## 2020-05-01 DIAGNOSIS — G8929 Other chronic pain: Secondary | ICD-10-CM | POA: Diagnosis not present

## 2020-05-01 DIAGNOSIS — M5441 Lumbago with sciatica, right side: Secondary | ICD-10-CM

## 2020-05-01 MED ORDER — METHYLPREDNISOLONE ACETATE 40 MG/ML IJ SUSP
40.0000 mg | Freq: Once | INTRAMUSCULAR | Status: AC
Start: 2020-05-01 — End: 2020-05-01
  Administered 2020-05-01: 40 mg via INTRAMUSCULAR

## 2020-05-01 MED ORDER — HYDROCODONE-ACETAMINOPHEN 5-325 MG PO TABS: 1.0000 | ORAL_TABLET | Freq: Four times a day (QID) | ORAL | 0 refills | Status: DC | PRN

## 2020-05-01 NOTE — Assessment & Plan Note (Signed)
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  Patient progressively is worse in the last 1 week and unable to stand on her feet.  Patient is now confined to wheelchair and unable to complete activities of daily living.  Patient has had spinal stenosis in the past but has not had a follow-up with neurosurgery ever since.  Referral to neurosurgery completed, started patient on Norco for pain management, pain shot given in clinic.  Follow-up with worsening unresolved symptoms.  Education provided printed handouts given.   Rx sent to pharmacy

## 2020-05-01 NOTE — Patient Instructions (Signed)
https://doi.[email or web address]">  Chronic Back Pain When back pain lasts longer than 3 months, it is called chronic back pain. The cause of your back pain may not be known. Some common causes include:  Wear and tear (degenerative disease) of the bones, ligaments, or disks in your back.  Inflammation and stiffness in your back (arthritis). People who have chronic back pain often go through certain periods in which the pain is more intense (flare-ups). Many people can learn to manage the pain with home care. Follow these instructions at home: Pay attention to any changes in your symptoms. Take these actions to help with your pain: Managing pain and stiffness  If directed, apply ice to the painful area. Your health care provider may recommend applying ice during the first 24-48 hours after a flare-up begins. To do this: ? Put ice in a plastic bag. ? Place a towel between your skin and the bag. ? Leave the ice on for 20 minutes, 2-3 times per day.  If directed, apply heat to the affected area as often as told by your health care provider. Use the heat source that your health care provider recommends, such as a moist heat pack or a heating pad. ? Place a towel between your skin and the heat source. ? Leave the heat on for 20-30 minutes. ? Remove the heat if your skin turns bright red. This is especially important if you are unable to feel pain, heat, or cold. You may have a greater risk of getting burned.  Try soaking in a warm tub.      Activity  Avoid bending and other activities that make the problem worse.  Maintain a proper position when standing or sitting: ? When standing, keep your upper back and neck straight, with your shoulders pulled back. Avoid slouching. ? When sitting, keep your back straight and relax your shoulders. Do not round your shoulders or pull them backward.  Do not sit or stand in one place for long periods of time.  Take brief periods of rest  throughout the day. This will reduce your pain. Resting in a lying or standing position is usually better than sitting to rest.  When you are resting for longer periods, mix in some mild activity or stretching between periods of rest. This will help to prevent stiffness and pain.  Get regular exercise. Ask your health care provider what activities are safe for you.  Do not lift anything that is heavier than 10 lb (4.5 kg), or the limit that you are told, until your health care provider says that it is safe. Always use proper lifting technique, which includes: ? Bending your knees. ? Keeping the load close to your body. ? Avoiding twisting.  Sleep on a firm mattress in a comfortable position. Try lying on your side with your knees slightly bent. If you lie on your back, put a pillow under your knees.   Medicines  Treatment may include medicines for pain and inflammation taken by mouth or applied to the skin, prescription pain medicine, or muscle relaxants. Take over-the-counter and prescription medicines only as told by your health care provider.  Ask your health care provider if the medicine prescribed to you: ? Requires you to avoid driving or using machinery. ? Can cause constipation. You may need to take these actions to prevent or treat constipation:  Drink enough fluid to keep your urine pale yellow.  Take over-the-counter or prescription medicines.  Eat foods that are high in fiber, such as   beans, whole grains, and fresh fruits and vegetables.  Limit foods that are high in fat and processed sugars, such as fried or sweet foods. General instructions  Do not use any products that contain nicotine or tobacco, such as cigarettes, e-cigarettes, and chewing tobacco. If you need help quitting, ask your health care provider.  Keep all follow-up visits as told by your health care provider. This is important. Contact a health care provider if:  You have pain that is not relieved with  rest or medicine.  Your pain gets worse, or you have new pain.  You have a high fever.  You have rapid weight loss.  You have trouble doing your normal activities. Get help right away if:  You have weakness or numbness in one or both of your legs or feet.  You have trouble controlling your bladder or your bowels.  You have severe back pain and have any of the following: ? Nausea or vomiting. ? Pain in your abdomen. ? Shortness of breath or you faint. Summary  Chronic back pain is back pain that lasts longer than 3 months.  When a flare-up begins, apply ice to the painful area for the first 24-48 hours.  Apply a moist heat pad or use a heating pad on the painful area as directed by your health care provider.  When you are resting for longer periods, mix in some mild activity or stretching between periods of rest. This will help to prevent stiffness and pain. This information is not intended to replace advice given to you by your health care provider. Make sure you discuss any questions you have with your health care provider. Document Revised: 04/07/2019 Document Reviewed: 04/07/2019 Elsevier Patient Education  2021 [employer]  

## 2020-05-01 NOTE — Progress Notes (Signed)
Acute Office Visit  Subjective:    Patient ID: Krystal Ali, female    DOB: 10-25-1945, 75 y.o.   MRN: [id number]  Chief Complaint  Patient presents with  . Hospitalization Follow-up  . Back Pain    HPI Patient is in today for Pain and emergency department follow-up visit.   Patient is a 75 year old female with a past medical history of anxiety, diabetes, GERD, hyperlipidemia, hypertension spinal fusion surgery.  Patient presented to the emergency department with complaints of left-sided lower back pain radiating down her left leg. Patient has never followed up with his spine surgeon after she relocated from [place] several years ago.  In clinic today patient continues to report pain over 6-7 out of 10 and can no longer stand on her feet.  Patient is in a wheelchair and unable to perform activities of daily living.  She reports chronic lower back pain. was not an injury that may have caused the pain. The pain started a few years ago and is gradually worsening. The pain does radiate Bilateral lower legs. The pain is described as aching and soreness, is 6/10 in intensity, occurring constantly. Symptoms are worse in the: morning, mid-day, afternoon, evening, nighttime  Aggravating factors: none Relieving factors: none.  She has tried acetaminophen, NSAIDs, oral steroids and prescription pain relievers with little relief.   ---------------------------------------------------------------------------------------------------   Past Medical History:  Diagnosis Date  . Anxiety   . Diabetes mellitus without complication ([place])   . GERD (gastroesophageal reflux disease)   . Hyperlipidemia   . Hypertension     Past Surgical History:  Procedure Laterality Date  . APPENDECTOMY    . BREAST SURGERY    . CHOLECYSTECTOMY      Family History  Problem Relation Age of Onset  . Heart disease Father   . COPD Brother     Social History   Socioeconomic History  . Marital status:  Divorced    Spouse name: Not on file  . Number of children: Not on file  . Years of education: Not on file  . Highest education level: Not on file  Occupational History  . Not on file  Tobacco Use  . Smoking status: Never Smoker  . Smokeless tobacco: Never Used  Substance and Sexual Activity  . Alcohol use: Not Currently  . Drug use: Never  . Sexual activity: Not Currently  Other Topics Concern  . Not on file  Social History Narrative  . Not on file   Social Determinants of Health   Financial Resource Strain: Not on file  Food Insecurity: Not on file  Transportation Needs: Not on file  Physical Activity: Not on file  Stress: Not on file  Social Connections: Not on file  Intimate Partner Violence: Not on file    Outpatient Medications Prior to Visit  Medication Sig Dispense Refill  . amLODipine (NORVASC) 5 MG tablet Take 5 mg by mouth daily.    Marland Kitchen atorvastatin (LIPITOR) 20 MG tablet Take 20 mg by mouth daily.    . blood glucose meter kit and supplies Dispense based on patient and insurance preference. Use up to four times daily as directed. (FOR ICD-10 E10.9, E11.9). 1 each 0  . cetirizine (ZYRTEC) 10 MG tablet Take 10 mg by mouth daily.    . fluticasone (FLONASE) 50 MCG/ACT nasal spray Place into both nostrils.    Marland Kitchen JANUMET XR [phone] MG TB24 Take 1 tablet by mouth daily.    Marland Kitchen lidocaine (LIDODERM) 5 % Place  1 patch onto the skin daily. Remove & Discard patch within 12 hours or as directed by MD 30 patch 0  . methylPREDNISolone (MEDROL DOSEPAK) 4 MG TBPK tablet Take as directed on packaging. 21 each 0  . mirabegron ER (MYRBETRIQ) 25 MG TB24 tablet Take 1 tablet (25 mg total) by mouth daily. 30 tablet 2  . venlafaxine XR (EFFEXOR-XR) 150 MG 24 hr capsule Take 150 mg by mouth daily with breakfast.    . HYDROcodone-acetaminophen (NORCO/VICODIN) 5-325 MG tablet Take 1-2 tablets by mouth every 6 (six) hours as needed for severe pain. 8 tablet 0  . OZEMPIC, 0.25 OR 0.5 MG/DOSE, 2  MG/1.5ML SOPN Inject into the skin. (Patient not taking: Reported on 05/01/2020)     No facility-administered medications prior to visit.    Allergies  Allergen Reactions  . Morphine And Related     Review of Systems  Constitutional: Negative.   HENT: Negative.   Eyes: Negative.   Respiratory: Negative.   Cardiovascular: Negative.   Gastrointestinal: Negative.   Genitourinary: Negative.   Musculoskeletal: Positive for arthralgias and back pain.  Skin: Negative.   Neurological: Negative.   All other systems reviewed and are negative.      Objective:    Physical Exam Vitals reviewed. Exam conducted with a chaperone present (daughter).  Constitutional:      Appearance: Normal appearance.  HENT:     Head: Normocephalic.     Nose: Nose normal.  Eyes:     Conjunctiva/sclera: Conjunctivae normal.  Cardiovascular:     Rate and Rhythm: Normal rate and regular rhythm.     Pulses: Normal pulses.     Heart sounds: Normal heart sounds.  Pulmonary:     Effort: Pulmonary effort is normal.     Breath sounds: Normal breath sounds.  Abdominal:     General: Bowel sounds are normal.  Musculoskeletal:        General: Tenderness present.  Skin:    General: Skin is warm.  Neurological:     Mental Status: She is alert and oriented to person, place, and time.  Psychiatric:        Behavior: Behavior normal.     BP (!) 158/77   Pulse 81   Temp 98 F (36.7 C)   Ht _0  (1.6 m)   SpO2 94%   BMI 32.77 kg/m  Wt Readings from Last 3 Encounters:  04/27/20 184 lb 15.5 oz (83.9 kg)  02/15/20 185 lb (83.9 kg)    Health Maintenance Due  Topic Date Due  . Hepatitis C Screening  Never done  . FOOT EXAM  Never done  . OPHTHALMOLOGY EXAM  Never done  . URINE MICROALBUMIN  Never done  . COLONOSCOPY (Pts 45-98yr Insurance coverage will need to be confirmed)  Never done  . MAMMOGRAM  Never done  . DEXA SCAN  Never done  . PNA vac Low Risk Adult (1 of 2 - PCV13) Never done     There are no preventive care reminders to display for this patient.   No results found for: TSH Lab Results  Component Value Date   WBC 9.0 04/27/2020   HGB 11.7 (L) 04/27/2020   HCT 34.5 (L) 04/27/2020   MCV 94.3 04/27/2020   PLT 250 04/27/2020   Lab Results  Component Value Date   NA 135 04/27/2020   K 3.7 04/27/2020   CO2 24 04/27/2020   GLUCOSE 183 (H) 04/27/2020   BUN 26 (H) 04/27/2020   CREATININE  0.69 04/27/2020   CALCIUM 8.7 (L) 04/27/2020   ANIONGAP 9 04/27/2020    Lab Results  Component Value Date   HGBA1C 7.1 (H) 02/15/2020       Assessment & Plan:   Problem List Items Addressed This Visit      Nervous and Auditory   Chronic low back pain with right-sided sciatica - Primary    Patient is 75 year old female who presents to clinic for chronic bilateral lower back pain with sciatica.  Patient progressively is worse in the last 1 week and unable to stand on her feet.  Patient is now confined to wheelchair and unable to complete activities of daily living.  Patient has had spinal stenosis in the past but has not had a follow-up with neurosurgery ever since.  Referral to neurosurgery completed, started patient on Norco for pain management, pain shot given in clinic.  Follow-up with worsening unresolved symptoms.  Education provided printed handouts given.   Rx sent to pharmacy       Relevant Medications   HYDROcodone-acetaminophen (NORCO) 5-325 MG tablet   Other Relevant Orders   Drug Screen 10 W/Conf, Se       Meds ordered this encounter  Medications  . HYDROcodone-acetaminophen (NORCO) 5-325 MG tablet    Sig: Take 1 tablet by mouth every 6 (six) hours as needed for moderate pain.    Dispense:  30 tablet    Refill:  0    Order Specific Question:   Supervising Provider    Answer:   Janora Norlander [[id number]]     [name], NP

## 2020-05-01 NOTE — Telephone Encounter (Signed)
Deniedon February 18 You asked for the drug listed above for your Dorsalgia, unspecified/acute back pain. [employer] follows Medicare rules. The Medicare rule in the Prescription Drug Manual (Chapter 6, Section 10.6) says an off-label use of a drug is a use that is not included on the drugs label as approved by the U.S. Food and Drug Administration (FDA). FDA approved drugs used for a disease other than what is on the official label may be covered under Medicare if [employer] determines the use to be medically accepted. [employer] makes these decisions on a case-by-case basis. We take into consideration the two major drug compendia. These compendia (or drug guides) are called the [place] and the [place] (AHFS-DI). Off-label use is medically accepted when there is evidence in one or more of the compendia that it works for the disease in question. [employer] has determined that the off-label use of this drug for your condition is not medically accepted per Medicare rules and isn&apos;t covered based on available information.

## 2020-05-01 NOTE — Addendum Note (Signed)
Addended by: [name] on: 05/01/2020 05:16 PM   Modules accepted: Orders

## 2020-05-01 NOTE — Telephone Encounter (Signed)
Please notify patient medication is not covered.  I believe patient has office visit today will reassess pain

## 2020-05-04 LAB — DRUG SCREEN 10 W/CONF, SERUM
Amphetamines, IA: NEGATIVE ng/mL
Barbiturates, IA: NEGATIVE ug/mL
Benzodiazepines, IA: NEGATIVE ng/mL
Cocaine & Metabolite, IA: NEGATIVE ng/mL
Methadone, IA: NEGATIVE ng/mL
Opiates, IA: NEGATIVE ng/mL
Oxycodones, IA: NEGATIVE ng/mL
Phencyclidine, IA: NEGATIVE ng/mL
Propoxyphene, IA: NEGATIVE ng/mL
THC(Marijuana) Metabolite, IA: NEGATIVE ng/mL

## 2020-05-15 ENCOUNTER — Other Ambulatory Visit: Payer: Self-pay | Admitting: Neurological Surgery

## 2020-05-15 DIAGNOSIS — M48062 Spinal stenosis, lumbar region with neurogenic claudication: Secondary | ICD-10-CM

## 2020-05-16 ENCOUNTER — Telehealth: Payer: Self-pay

## 2020-05-16 NOTE — Telephone Encounter (Signed)
Phone call to patient to verify medication list and allergies for myelogram procedure. Pt instructed to hold Effexor for 48hrs prior to myelogram appointment time and 24 hours after appointment. Pt also instructed to have a driver the day of the procedure, the procedure would take around 2 hours, and discharge instructions discussed. Pt verbalized understanding.

## 2020-05-29 ENCOUNTER — Telehealth: Payer: Self-pay

## 2020-05-30 NOTE — Telephone Encounter (Signed)
FL2 typed up & placed on providers desk  Aware ppw ready to be picked up

## 2020-06-01 ENCOUNTER — Other Ambulatory Visit: Payer: Medicare Other

## 2020-06-08 ENCOUNTER — Ambulatory Visit
Admission: RE | Admit: 2020-06-08 | Discharge: 2020-06-08 | Disposition: A | Discharge status: Home / Self Care | Payer: Medicare PPO | Source: Ambulatory Visit | Attending: Neurological Surgery | Admitting: Neurological Surgery

## 2020-06-08 ENCOUNTER — Other Ambulatory Visit: Payer: Self-pay

## 2020-06-08 DIAGNOSIS — M48062 Spinal stenosis, lumbar region with neurogenic claudication: Secondary | ICD-10-CM

## 2020-06-08 MED ORDER — DIAZEPAM 5 MG PO TABS
5.0000 mg | ORAL_TABLET | Freq: Once | ORAL | Status: AC
  Administered 2020-06-08: 5 mg via ORAL

## 2020-06-08 MED ORDER — IOPAMIDOL (ISOVUE-M 300) INJECTION 61%
10.0000 mL | Freq: Once | INTRAMUSCULAR | Status: AC
  Administered 2020-06-08: 10 mL via INTRATHECAL

## 2020-06-08 MED ORDER — ONDANSETRON 4 MG PO TBDP
4.0000 mg | ORAL_TABLET | Freq: Once | ORAL | Status: AC
  Administered 2020-06-08: 4 mg via ORAL

## 2020-06-08 MED ORDER — OXYCODONE-ACETAMINOPHEN 5-325 MG PO TABS
1.0000 | ORAL_TABLET | Freq: Once | ORAL | Status: AC
  Administered 2020-06-08: 1 via ORAL

## 2020-06-08 NOTE — Discharge Instr - Other Orders (Addendum)
1015: pt reports pain 7/10 in lower back prior to starting myelogram procedure. Per patient she is out of pain medication at home and has not had any in 2 days. Dr. Jeralyn Ruths notified of this. See MAR.  Came back from myelogram with dry heaves.  See MAR Upon discharge no more nausea/vomiting.  Daughter present and assisted with transfer to wheelchair and then car.

## 2020-06-08 NOTE — Discharge Instructions (Signed)
Myelogram Discharge Instructions  1. Go home and rest quietly for the next 24 hours.  It is important to lie flat for the next 24 hours.  Get up only to go to the restroom.  You may lie in the bed or on a couch on your back, your stomach, your left side or your right side.  You may have one pillow under your head.  You may have pillows between your knees while you are on your side or under your knees while you are on your back.  2. DO NOT drive today.  Recline the seat as far back as it will go, while still wearing your seat belt, on the way home.  3. You may get up to go to the bathroom as needed.  You may sit up for 10 minutes to eat.  You may resume your normal diet and medications unless otherwise indicated.  Drink lots of extra fluids today and tomorrow.  4. The incidence of headache, nausea, or vomiting is about 5% (one in 20 patients).  If you develop a headache, lie flat and drink plenty of fluids until the headache goes away.  Caffeinated beverages may be helpful.  If you develop severe nausea and vomiting or a headache that does not go away with flat bed rest, call [phone].  5. You may resume normal activities after your 24 hours of bed rest is over; however, do not exert yourself strongly or do any heavy lifting tomorrow. If when you get up you have a headache when standing, go back to bed and force fluids for another 24 hours.  6. Call your physician for a follow-up appointment.  The results of your myelogram will be sent directly to your physician by the following day.  7. If you have any questions or if complications develop after you arrive home, please call [phone].  Discharge instructions have been explained to the patient.  The patient, or the person responsible for the patient, fully understands these instructions   YOU MAY TAKE YOUR EFFEXOR TOMORROW ON 06/09/20@10 :30AM OR THEREAFTER.

## 2020-07-04 ENCOUNTER — Other Ambulatory Visit: Payer: Self-pay | Admitting: Neurological Surgery

## 2020-07-17 ENCOUNTER — Telehealth: Payer: Self-pay

## 2020-07-17 NOTE — Telephone Encounter (Signed)
Pt and pts daughter called stating that pt needs a refill on her Effexor Rx for her anxiety. Pt unable to come in to the office due to her getting ready to have back surgery on 08/08/20.  Please advise and call pt at [phone]

## 2020-07-17 NOTE — Telephone Encounter (Signed)
Pt's PCP in [place] is the original prescriber of the Effexor. Pt moved here last Sept. Hard for pt to come in the office right now. Can do a telephone visit if needed.

## 2020-07-18 ENCOUNTER — Telehealth (INDEPENDENT_AMBULATORY_CARE_PROVIDER_SITE_OTHER): Payer: Medicare Other | Admitting: Nurse Practitioner

## 2020-07-18 ENCOUNTER — Encounter: Payer: Self-pay | Admitting: Nurse Practitioner

## 2020-07-18 DIAGNOSIS — F339 Major depressive disorder, recurrent, unspecified: Secondary | ICD-10-CM | POA: Diagnosis not present

## 2020-07-18 DIAGNOSIS — F419 Anxiety disorder, unspecified: Secondary | ICD-10-CM | POA: Insufficient documentation

## 2020-07-18 MED ORDER — VENLAFAXINE HCL ER 150 MG PO CP24: 150.0000 mg | ORAL_CAPSULE | Freq: Every day | ORAL | 1 refills | Status: AC

## 2020-07-18 NOTE — Telephone Encounter (Signed)
Appt scheduled with Je today at 4:15pm. The daughter will be available at about 4 to help pt with virtual visit.

## 2020-07-18 NOTE — Assessment & Plan Note (Signed)
Depression and anxiety controlled on current medication dose,  Patient ran out of medication and that may have affected screening today. Effexor 150 mg Refill sent to pharmacy. Completed GAD-7 and PHQ-9

## 2020-07-18 NOTE — Progress Notes (Signed)
Virtual Visit  Note Due to COVID-19 pandemic this visit was conducted virtually. This visit type was conducted due to national recommendations for restrictions regarding the COVID-19 Pandemic (e.g. social distancing, sheltering in place) in an effort to limit this patient's exposure and mitigate transmission in our community. All issues noted in this document were discussed and addressed.  A physical exam was not performed with this format.  I connected with Krystal Ali on 07/18/20 at 4:45 pm by video and verified that I am speaking with the correct person using two identifiers. Krystal Ali is currently located home and daughter was with patient during visit. The provider, [name], NP is located in their office at time of visit.  I discussed the limitations, risks, security and privacy concerns of performing an evaluation and management service by telephone and the availability of in person appointments. I also discussed with the patient that there may be a patient responsible charge related to this service. The patient expressed understanding and agreed to proceed.   History and Present Illness:  Anxiety Presents for follow-up visit. Symptoms include decreased concentration, depressed mood, excessive worry, insomnia and nervous/anxious behavior. Patient reports no chest pain, compulsions, feeling of choking or suicidal ideas. Symptoms occur constantly. The severity of symptoms is severe. The quality of sleep is fair.   Compliance with medications is 76-100%. Treatment side effects: no side effects.  Depression        This is a chronic problem.  The current episode started more than 1 year ago.   The onset quality is gradual.   The problem occurs daily.The problem is unchanged.  Associated symptoms include decreased concentration, fatigue, helplessness and insomnia.  Associated symptoms include no suicidal ideas.     The symptoms are aggravated by nothing.  Past treatments  include SNRIs - Serotonin and norepinephrine reuptake inhibitors.  Compliance with treatment is good.  Previous treatment provided moderate relief.  Past medical history includes anxiety.       Review of Systems  Constitutional: Positive for fatigue.  HENT: Negative.   Cardiovascular: Negative for chest pain.  Skin: Negative for rash.  Psychiatric/Behavioral: Positive for decreased concentration and depression. Negative for suicidal ideas. The patient is nervous/anxious and has insomnia.   All other systems reviewed and are negative.    Observations/Objective: Patient did not look to be in distress during the video visit  Assessment and Plan: Depression and anxiety controlled on current medication dose, Effexor 150 mg Refill sent to pharmacy. Completed GAD-7 and PHQ-9   Flowsheet Row Video Visit from 07/18/2020 in [facility]  PHQ-9 Total Score 16     GAD 7 : Generalized Anxiety Score 07/18/2020 02/15/2020  Nervous, Anxious, on Edge 3 1  Control/stop worrying 3 2  Worry too much - different things 3 2  Trouble relaxing 3 2  Restless 3 0  Easily annoyed or irritable 2 1  Afraid - awful might happen 2 1  Total GAD 7 Score 19 9  Anxiety Difficulty Somewhat difficult Somewhat difficult    Follow Up Instructions:  follow up in 6 months or with uncontrolled symptoms    I discussed the assessment and treatment plan with the patient. The patient was provided an opportunity to ask questions and all were answered. The patient agreed with the plan and demonstrated an understanding of the instructions.   The patient was advised to call back or seek an in-person evaluation if the symptoms worsen or if the condition  fails to improve as anticipated.  The above assessment and management plan was discussed with the patient. The patient verbalized understanding of and has agreed to the management plan. Patient is aware to call the clinic if symptoms persist or worsen.  Patient is aware when to return to the clinic for a follow-up visit. Patient educated on when it is appropriate to go to the emergency department.   Time call ended:  4:57 pm   I provided 12 minutes of  non face-to-face time during this encounter.    [name], NP

## 2020-08-09 HISTORY — PX: BACK SURGERY: SHX140

## 2020-08-17 ENCOUNTER — Other Ambulatory Visit: Payer: Self-pay

## 2020-08-17 NOTE — Anesthesia Preprocedure Evaluation (Addendum)
Anesthesia Evaluation  Patient identified by MRN, date of birth, ID band Patient awake    Reviewed: Allergy & Precautions, NPO status , Patient's Chart, lab work & pertinent test results  Airway Mallampati: II  TM Distance: >3 FB     Dental   Pulmonary neg pulmonary ROS,    breath sounds clear to auscultation       Cardiovascular hypertension,  Rhythm:Regular Rate:Normal     Neuro/Psych negative neurological ROS     GI/Hepatic GERD  ,  Endo/Other  diabetes  Renal/GU      Musculoskeletal   Abdominal   Peds  Hematology   Anesthesia Other Findings   Reproductive/Obstetrics                            Anesthesia Physical Anesthesia Plan  ASA: 3  Anesthesia Plan: General   Post-op Pain Management:    Induction: Intravenous  PONV Risk Score and Plan: 3 and Ondansetron, Dexamethasone and Midazolam  Airway Management Planned: Oral ETT  Additional Equipment:   Intra-op Plan:   Post-operative Plan: Possible Post-op intubation/ventilation  Informed Consent: I have reviewed the patients History and Physical, chart, labs and discussed the procedure including the risks, benefits and alternatives for the proposed anesthesia with the patient or authorized representative who has indicated his/her understanding and acceptance.     Dental advisory given  Plan Discussed with: CRNA and Anesthesiologist  Anesthesia Plan Comments:        Anesthesia Quick Evaluation

## 2020-08-17 NOTE — Progress Notes (Signed)
Krystal Ali is Krystal Ali's daughter and designated person to talk to. Per [name] Krystal Ali has not complained of chest pain and has nos/s of COvid nor been in contact with anyone who has. Krystal Ali has type II diabetes, she is on no medications.  Stephaine stated that she thinks Krystal Ali checks CBG, but she has no idea of numbers.  I instructed Krystal Ali to ask patient to check CBG after awaking and every 2 hours until arrival  to the hospital.  I Instructed patient if CBG is less than 70 to take 4 Glucose Tablets or 1 tube of Glucose Gel or 1/2 cup of a clear juice. Recheck CBG in 15 minutes if CBG is not over 70 call, pre- op desk at [phone] for further instructions.

## 2020-08-18 ENCOUNTER — Encounter (HOSPITAL_COMMUNITY)
Admission: RE | Disposition: A | Discharge status: Skilled Nursing Facility | Payer: Self-pay | Source: Home / Self Care | Attending: Neurological Surgery

## 2020-08-18 ENCOUNTER — Inpatient Hospital Stay (HOSPITAL_COMMUNITY): Payer: Medicare Other | Admitting: Certified Registered Nurse Anesthetist

## 2020-08-18 ENCOUNTER — Encounter (HOSPITAL_COMMUNITY): Payer: Self-pay | Admitting: Neurological Surgery

## 2020-08-18 ENCOUNTER — Inpatient Hospital Stay (HOSPITAL_COMMUNITY)
Admission: RE | Admit: 2020-08-18 | Discharge: 2020-08-24 | DRG: 455 | Disposition: A | Discharge status: Skilled Nursing Facility | Payer: Medicare Other | Attending: Neurological Surgery | Admitting: Neurological Surgery

## 2020-08-18 ENCOUNTER — Inpatient Hospital Stay (HOSPITAL_COMMUNITY): Payer: Medicare Other

## 2020-08-18 DIAGNOSIS — E785 Hyperlipidemia, unspecified: Secondary | ICD-10-CM | POA: Diagnosis present

## 2020-08-18 DIAGNOSIS — E119 Type 2 diabetes mellitus without complications: Secondary | ICD-10-CM | POA: Diagnosis present

## 2020-08-18 DIAGNOSIS — M961 Postlaminectomy syndrome, not elsewhere classified: Secondary | ICD-10-CM | POA: Diagnosis present

## 2020-08-18 DIAGNOSIS — M549 Dorsalgia, unspecified: Secondary | ICD-10-CM | POA: Diagnosis not present

## 2020-08-18 DIAGNOSIS — M4726 Other spondylosis with radiculopathy, lumbar region: Secondary | ICD-10-CM | POA: Diagnosis not present

## 2020-08-18 DIAGNOSIS — R2689 Other abnormalities of gait and mobility: Secondary | ICD-10-CM | POA: Diagnosis present

## 2020-08-18 DIAGNOSIS — R279 Unspecified lack of coordination: Secondary | ICD-10-CM | POA: Diagnosis present

## 2020-08-18 DIAGNOSIS — R32 Unspecified urinary incontinence: Secondary | ICD-10-CM | POA: Diagnosis present

## 2020-08-18 DIAGNOSIS — R339 Retention of urine, unspecified: Secondary | ICD-10-CM | POA: Diagnosis present

## 2020-08-18 DIAGNOSIS — Z20822 Contact with and (suspected) exposure to covid-19: Secondary | ICD-10-CM | POA: Diagnosis present

## 2020-08-18 DIAGNOSIS — Z419 Encounter for procedure for purposes other than remedying health state, unspecified: Secondary | ICD-10-CM

## 2020-08-18 DIAGNOSIS — F419 Anxiety disorder, unspecified: Secondary | ICD-10-CM | POA: Diagnosis present

## 2020-08-18 DIAGNOSIS — M6281 Muscle weakness (generalized): Secondary | ICD-10-CM | POA: Diagnosis present

## 2020-08-18 DIAGNOSIS — M48062 Spinal stenosis, lumbar region with neurogenic claudication: Secondary | ICD-10-CM | POA: Diagnosis present

## 2020-08-18 DIAGNOSIS — Z8249 Family history of ischemic heart disease and other diseases of the circulatory system: Secondary | ICD-10-CM

## 2020-08-18 DIAGNOSIS — Z7401 Bed confinement status: Secondary | ICD-10-CM | POA: Diagnosis not present

## 2020-08-18 DIAGNOSIS — F29 Unspecified psychosis not due to a substance or known physiological condition: Secondary | ICD-10-CM | POA: Diagnosis not present

## 2020-08-18 DIAGNOSIS — Z981 Arthrodesis status: Secondary | ICD-10-CM | POA: Diagnosis not present

## 2020-08-18 DIAGNOSIS — M4327 Fusion of spine, lumbosacral region: Secondary | ICD-10-CM | POA: Diagnosis present

## 2020-08-18 DIAGNOSIS — M5416 Radiculopathy, lumbar region: Secondary | ICD-10-CM | POA: Diagnosis present

## 2020-08-18 DIAGNOSIS — R4189 Other symptoms and signs involving cognitive functions and awareness: Secondary | ICD-10-CM | POA: Diagnosis present

## 2020-08-18 DIAGNOSIS — I10 Essential (primary) hypertension: Secondary | ICD-10-CM | POA: Diagnosis present

## 2020-08-18 DIAGNOSIS — F339 Major depressive disorder, recurrent, unspecified: Secondary | ICD-10-CM | POA: Diagnosis not present

## 2020-08-18 DIAGNOSIS — S345XXA Injury of lumbar, sacral and pelvic sympathetic nerves, initial encounter: Secondary | ICD-10-CM | POA: Diagnosis present

## 2020-08-18 DIAGNOSIS — M4326 Fusion of spine, lumbar region: Secondary | ICD-10-CM | POA: Diagnosis not present

## 2020-08-18 DIAGNOSIS — K219 Gastro-esophageal reflux disease without esophagitis: Secondary | ICD-10-CM | POA: Diagnosis present

## 2020-08-18 DIAGNOSIS — R479 Unspecified speech disturbances: Secondary | ICD-10-CM | POA: Diagnosis present

## 2020-08-18 DIAGNOSIS — R29898 Other symptoms and signs involving the musculoskeletal system: Secondary | ICD-10-CM | POA: Diagnosis not present

## 2020-08-18 DIAGNOSIS — R2681 Unsteadiness on feet: Secondary | ICD-10-CM | POA: Diagnosis present

## 2020-08-18 LAB — POCT I-STAT, CHEM 8
BUN: 27 mg/dL — ABNORMAL HIGH (ref 8–23)
BUN: 21 mg/dL (ref 8–23)
BUN: 30 mg/dL — ABNORMAL HIGH (ref 8–23)
Calcium, Ion: 1.19 mmol/L (ref 1.15–1.40)
Calcium, Ion: 1.15 mmol/L (ref 1.15–1.40)
Calcium, Ion: 1.09 mmol/L — ABNORMAL LOW (ref 1.15–1.40)
Chloride: 101 mmol/L (ref 98–111)
Chloride: 100 mmol/L (ref 98–111)
Chloride: 101 mmol/L (ref 98–111)
Creatinine, Ser: 0.8 mg/dL (ref 0.44–1.00)
Creatinine, Ser: 0.5 mg/dL (ref 0.44–1.00)
Creatinine, Ser: 0.8 mg/dL (ref 0.44–1.00)
Glucose, Bld: 79 mg/dL (ref 70–99)
Glucose, Bld: 114 mg/dL — ABNORMAL HIGH (ref 70–99)
Glucose, Bld: 163 mg/dL — ABNORMAL HIGH (ref 70–99)
HCT: 28 % — ABNORMAL LOW (ref 36.0–46.0)
HCT: 16 % — ABNORMAL LOW (ref 36.0–46.0)
HCT: 25 % — ABNORMAL LOW (ref 36.0–46.0)
Hemoglobin: 9.5 g/dL — ABNORMAL LOW (ref 12.0–15.0)
Hemoglobin: 5.4 g/dL — CL (ref 12.0–15.0)
Hemoglobin: 8.5 g/dL — ABNORMAL LOW (ref 12.0–15.0)
Potassium: 4 mmol/L (ref 3.5–5.1)
Potassium: 4.2 mmol/L (ref 3.5–5.1)
Potassium: 4.6 mmol/L (ref 3.5–5.1)
Sodium: 136 mmol/L (ref 135–145)
Sodium: 135 mmol/L (ref 135–145)
Sodium: 135 mmol/L (ref 135–145)
TCO2: 24 mmol/L (ref 22–32)
TCO2: 17 mmol/L — ABNORMAL LOW (ref 22–32)
TCO2: 24 mmol/L (ref 22–32)

## 2020-08-18 LAB — CBC
HCT: 29.7 % — ABNORMAL LOW (ref 36.0–46.0)
Hemoglobin: 9.7 g/dL — ABNORMAL LOW (ref 12.0–15.0)
MCH: 32.6 pg (ref 26.0–34.0)
MCHC: 32.7 g/dL (ref 30.0–36.0)
MCV: 99.7 fL (ref 80.0–100.0)
Platelets: 235 10*3/uL (ref 150–400)
RBC: 2.98 MIL/uL — ABNORMAL LOW (ref 3.87–5.11)
RDW: 12.1 % (ref 11.5–15.5)
WBC: 5.7 10*3/uL (ref 4.0–10.5)
nRBC: 0 % (ref 0.0–0.2)

## 2020-08-18 LAB — BASIC METABOLIC PANEL
Anion gap: 8 (ref 5–15)
BUN: 28 mg/dL — ABNORMAL HIGH (ref 8–23)
CO2: 26 mmol/L (ref 22–32)
Calcium: 9 mg/dL (ref 8.9–10.3)
Chloride: 102 mmol/L (ref 98–111)
Creatinine, Ser: 0.86 mg/dL (ref 0.44–1.00)
GFR, Estimated: >60 mL/min (ref >60)
Glucose, Bld: 87 mg/dL (ref 70–99)
Potassium: 4.1 mmol/L (ref 3.5–5.1)
Sodium: 136 mmol/L (ref 135–145)

## 2020-08-18 LAB — SURGICAL PCR SCREEN
MRSA, PCR: POSITIVE — AB
Staphylococcus aureus: POSITIVE — AB

## 2020-08-18 LAB — ABO/RH: ABO/RH(D): A POS

## 2020-08-18 LAB — PREPARE RBC (CROSSMATCH)

## 2020-08-18 LAB — GLUCOSE, CAPILLARY
Glucose-Capillary: 100 mg/dL — ABNORMAL HIGH (ref 70–99)
Glucose-Capillary: 180 mg/dL — ABNORMAL HIGH (ref 70–99)

## 2020-08-18 LAB — SARS CORONAVIRUS 2 BY RT PCR (HOSPITAL ORDER, PERFORMED IN ~~LOC~~ HOSPITAL LAB): SARS Coronavirus 2: NEGATIVE

## 2020-08-18 SURGERY — POSTERIOR LUMBAR FUSION 3 LEVEL
Anesthesia: General

## 2020-08-18 MED ORDER — PHENYLEPHRINE 40 MCG/ML (10ML) SYRINGE FOR IV PUSH (FOR BLOOD PRESSURE SUPPORT)
PREFILLED_SYRINGE | INTRAVENOUS | Status: DC | PRN
  Administered 2020-08-18: 120 ug via INTRAVENOUS
  Administered 2020-08-18: 160 ug via INTRAVENOUS

## 2020-08-18 MED ORDER — PROPOFOL 10 MG/ML IV BOLUS
INTRAVENOUS | Status: AC
  Filled 2020-08-18: qty 20

## 2020-08-18 MED ORDER — CHLORHEXIDINE GLUCONATE 0.12 % MT SOLN
15.0000 mL | Freq: Once | OROMUCOSAL | Status: AC
  Administered 2020-08-18: 15 mL via OROMUCOSAL
  Filled 2020-08-18: qty 15

## 2020-08-18 MED ORDER — DOCUSATE SODIUM 100 MG PO CAPS
100.0000 mg | ORAL_CAPSULE | Freq: Two times a day (BID) | ORAL | Status: DC
  Administered 2020-08-18 – 2020-08-24 (×12): 100 mg via ORAL
  Filled 2020-08-18 (×12): qty 1

## 2020-08-18 MED ORDER — PROPOFOL 10 MG/ML IV BOLUS
INTRAVENOUS | Status: DC | PRN
  Administered 2020-08-18: 150 mg via INTRAVENOUS

## 2020-08-18 MED ORDER — SODIUM CHLORIDE 0.9 % IV SOLN
250.0000 mL | INTRAVENOUS | Status: DC
  Administered 2020-08-18: 250 mL via INTRAVENOUS

## 2020-08-18 MED ORDER — FENTANYL CITRATE (PF) 100 MCG/2ML IJ SOLN: 25.0000 ug | INTRAMUSCULAR | Status: DC | PRN

## 2020-08-18 MED ORDER — ORAL CARE MOUTH RINSE: 15.0000 mL | Freq: Once | OROMUCOSAL | Status: AC

## 2020-08-18 MED ORDER — DEXAMETHASONE SODIUM PHOSPHATE 10 MG/ML IJ SOLN
INTRAMUSCULAR | Status: DC | PRN
  Administered 2020-08-18: 5 mg via INTRAVENOUS

## 2020-08-18 MED ORDER — LIDOCAINE-EPINEPHRINE 1 %-1:100000 IJ SOLN
INTRAMUSCULAR | Status: DC | PRN
  Administered 2020-08-18: 5 mL

## 2020-08-18 MED ORDER — THROMBIN 5000 UNITS EX SOLR
OROMUCOSAL | Status: DC | PRN
  Administered 2020-08-18: 5 mL via TOPICAL

## 2020-08-18 MED ORDER — OXYCODONE HCL 5 MG PO TABS
10.0000 mg | ORAL_TABLET | ORAL | Status: DC | PRN
  Administered 2020-08-19 – 2020-08-24 (×15): 10 mg via ORAL
  Filled 2020-08-18 (×15): qty 2

## 2020-08-18 MED ORDER — CEFAZOLIN SODIUM-DEXTROSE 2-4 GM/100ML-% IV SOLN
2.0000 g | INTRAVENOUS | Status: AC
  Administered 2020-08-18: 2 g via INTRAVENOUS
  Filled 2020-08-18: qty 100

## 2020-08-18 MED ORDER — 0.9 % SODIUM CHLORIDE (POUR BTL) OPTIME
TOPICAL | Status: DC | PRN
  Administered 2020-08-18 (×2): 1000 mL

## 2020-08-18 MED ORDER — POLYETHYLENE GLYCOL 3350 17 G PO PACK: 17.0000 g | PACK | Freq: Every day | ORAL | Status: DC | PRN

## 2020-08-18 MED ORDER — CHLORHEXIDINE GLUCONATE CLOTH 2 % EX PADS: 6.0000 | MEDICATED_PAD | Freq: Once | CUTANEOUS | Status: DC

## 2020-08-18 MED ORDER — BUPIVACAINE HCL (PF) 0.5 % IJ SOLN
INTRAMUSCULAR | Status: AC
  Filled 2020-08-18: qty 30

## 2020-08-18 MED ORDER — FENTANYL CITRATE (PF) 100 MCG/2ML IJ SOLN
INTRAMUSCULAR | Status: DC | PRN
  Administered 2020-08-18: 25 ug via INTRAVENOUS
  Administered 2020-08-18: 50 ug via INTRAVENOUS
  Administered 2020-08-18: 25 ug via INTRAVENOUS
  Administered 2020-08-18: 100 ug via INTRAVENOUS
  Administered 2020-08-18: 50 ug via INTRAVENOUS

## 2020-08-18 MED ORDER — SODIUM CHLORIDE 0.9% IV SOLUTION: Freq: Once | INTRAVENOUS | Status: DC

## 2020-08-18 MED ORDER — MENTHOL 3 MG MT LOZG: 1.0000 | LOZENGE | OROMUCOSAL | Status: DC | PRN

## 2020-08-18 MED ORDER — PHENOL 1.4 % MT LIQD: 1.0000 | OROMUCOSAL | Status: DC | PRN

## 2020-08-18 MED ORDER — OXYCODONE HCL 5 MG PO TABS
5.0000 mg | ORAL_TABLET | ORAL | Status: DC | PRN
Start: 2020-08-18 — End: 2020-08-25
  Administered 2020-08-18 – 2020-08-21 (×6): 5 mg via ORAL
  Filled 2020-08-18 (×8): qty 1

## 2020-08-18 MED ORDER — DEXAMETHASONE SODIUM PHOSPHATE 10 MG/ML IJ SOLN
INTRAMUSCULAR | Status: AC
  Filled 2020-08-18: qty 1

## 2020-08-18 MED ORDER — FENTANYL CITRATE (PF) 250 MCG/5ML IJ SOLN
INTRAMUSCULAR | Status: AC
  Filled 2020-08-18: qty 5

## 2020-08-18 MED ORDER — LACTATED RINGERS IV SOLN: INTRAVENOUS | Status: DC

## 2020-08-18 MED ORDER — HYDROMORPHONE HCL 1 MG/ML IJ SOLN
1.0000 mg | INTRAMUSCULAR | Status: DC | PRN
  Administered 2020-08-20: 1 mg via INTRAVENOUS
  Filled 2020-08-18: qty 1

## 2020-08-18 MED ORDER — CHLORHEXIDINE GLUCONATE CLOTH 2 % EX PADS
6.0000 | MEDICATED_PAD | Freq: Every day | CUTANEOUS | Status: AC
  Administered 2020-08-19 – 2020-08-23 (×4): 6 via TOPICAL

## 2020-08-18 MED ORDER — ONDANSETRON HCL 4 MG/2ML IJ SOLN
INTRAMUSCULAR | Status: DC | PRN
  Administered 2020-08-18: 4 mg via INTRAVENOUS

## 2020-08-18 MED ORDER — ONDANSETRON HCL 4 MG/2ML IJ SOLN
4.0000 mg | Freq: Four times a day (QID) | INTRAMUSCULAR | Status: DC | PRN
  Administered 2020-08-18: 4 mg via INTRAVENOUS
  Filled 2020-08-18: qty 2

## 2020-08-18 MED ORDER — ONDANSETRON HCL 4 MG PO TABS: 4.0000 mg | ORAL_TABLET | Freq: Four times a day (QID) | ORAL | Status: DC | PRN

## 2020-08-18 MED ORDER — BUPIVACAINE HCL (PF) 0.5 % IJ SOLN
INTRAMUSCULAR | Status: DC | PRN
  Administered 2020-08-18: 5 mL

## 2020-08-18 MED ORDER — MUPIROCIN 2 % EX OINT
1.0000 "application " | TOPICAL_OINTMENT | Freq: Two times a day (BID) | CUTANEOUS | Status: AC
  Administered 2020-08-18 – 2020-08-23 (×10): 1 via NASAL
  Filled 2020-08-18 (×2): qty 22

## 2020-08-18 MED ORDER — CYCLOBENZAPRINE HCL 10 MG PO TABS
10.0000 mg | ORAL_TABLET | Freq: Three times a day (TID) | ORAL | Status: DC | PRN
  Administered 2020-08-19 – 2020-08-23 (×9): 10 mg via ORAL
  Filled 2020-08-18 (×9): qty 1

## 2020-08-18 MED ORDER — PHENYLEPHRINE HCL-NACL 10-0.9 MG/250ML-% IV SOLN
INTRAVENOUS | Status: DC | PRN
  Administered 2020-08-18: 25 ug/min via INTRAVENOUS

## 2020-08-18 MED ORDER — SODIUM CHLORIDE 0.9% FLUSH: 3.0000 mL | INTRAVENOUS | Status: DC | PRN

## 2020-08-18 MED ORDER — ONDANSETRON HCL 4 MG/2ML IJ SOLN
INTRAMUSCULAR | Status: AC
  Filled 2020-08-18: qty 2

## 2020-08-18 MED ORDER — ROCURONIUM BROMIDE 10 MG/ML (PF) SYRINGE
PREFILLED_SYRINGE | INTRAVENOUS | Status: AC
  Filled 2020-08-18: qty 10

## 2020-08-18 MED ORDER — ALBUMIN HUMAN 5 % IV SOLN: INTRAVENOUS | Status: DC | PRN

## 2020-08-18 MED ORDER — FENTANYL CITRATE (PF) 100 MCG/2ML IJ SOLN
INTRAMUSCULAR | Status: AC
  Filled 2020-08-18: qty 2

## 2020-08-18 MED ORDER — THROMBIN 5000 UNITS EX SOLR
CUTANEOUS | Status: AC
  Filled 2020-08-18: qty 5000

## 2020-08-18 MED ORDER — MIRABEGRON ER 50 MG PO TB24
50.0000 mg | ORAL_TABLET | Freq: Every day | ORAL | Status: DC
  Administered 2020-08-18 – 2020-08-24 (×7): 50 mg via ORAL
  Filled 2020-08-18 (×7): qty 1

## 2020-08-18 MED ORDER — VENLAFAXINE HCL ER 75 MG PO CP24
150.0000 mg | ORAL_CAPSULE | Freq: Every day | ORAL | Status: DC
  Administered 2020-08-19 – 2020-08-24 (×6): 150 mg via ORAL
  Filled 2020-08-18 (×6): qty 2

## 2020-08-18 MED ORDER — SODIUM CHLORIDE 0.9 % IV SOLN: INTRAVENOUS | Status: DC | PRN

## 2020-08-18 MED ORDER — ESMOLOL HCL 100 MG/10ML IV SOLN
INTRAVENOUS | Status: DC | PRN
  Administered 2020-08-18: 15 mg via INTRAVENOUS

## 2020-08-18 MED ORDER — FENTANYL CITRATE (PF) 100 MCG/2ML IJ SOLN
25.0000 ug | INTRAMUSCULAR | Status: DC | PRN
  Administered 2020-08-18: 25 ug via INTRAVENOUS

## 2020-08-18 MED ORDER — ACETAMINOPHEN 325 MG PO TABS
650.0000 mg | ORAL_TABLET | ORAL | Status: DC | PRN
  Administered 2020-08-18 – 2020-08-21 (×4): 650 mg via ORAL
  Filled 2020-08-18 (×4): qty 2

## 2020-08-18 MED ORDER — ACETAMINOPHEN 650 MG RE SUPP: 650.0000 mg | RECTAL | Status: DC | PRN

## 2020-08-18 MED ORDER — LIDOCAINE HCL (PF) 2 % IJ SOLN
INTRAMUSCULAR | Status: DC | PRN
  Administered 2020-08-18: 80 mg via INTRADERMAL

## 2020-08-18 MED ORDER — LIDOCAINE HCL (PF) 2 % IJ SOLN
INTRAMUSCULAR | Status: AC
  Filled 2020-08-18: qty 5

## 2020-08-18 MED ORDER — ROCURONIUM BROMIDE 10 MG/ML (PF) SYRINGE
PREFILLED_SYRINGE | INTRAVENOUS | Status: DC | PRN
  Administered 2020-08-18 (×2): 10 mg via INTRAVENOUS
  Administered 2020-08-18: 20 mg via INTRAVENOUS
  Administered 2020-08-18: 60 mg via INTRAVENOUS

## 2020-08-18 MED ORDER — CEFAZOLIN SODIUM-DEXTROSE 2-4 GM/100ML-% IV SOLN
2.0000 g | Freq: Three times a day (TID) | INTRAVENOUS | Status: AC
  Administered 2020-08-18 (×2): 2 g via INTRAVENOUS
  Filled 2020-08-18 (×2): qty 100

## 2020-08-18 MED ORDER — LIDOCAINE-EPINEPHRINE 1 %-1:100000 IJ SOLN
INTRAMUSCULAR | Status: AC
  Filled 2020-08-18: qty 1

## 2020-08-18 MED ORDER — SUGAMMADEX SODIUM 200 MG/2ML IV SOLN
INTRAVENOUS | Status: DC | PRN
  Administered 2020-08-18: 150 mg via INTRAVENOUS

## 2020-08-18 MED ORDER — SODIUM CHLORIDE 0.9% FLUSH
3.0000 mL | Freq: Two times a day (BID) | INTRAVENOUS | Status: DC
  Administered 2020-08-18 – 2020-08-23 (×11): 3 mL via INTRAVENOUS

## 2020-08-18 SURGICAL SUPPLY — 73 items
BASKET BONE COLLECTION (BASKET) ×3 IMPLANT
BENZOIN TINCTURE PRP APPL 2/3 (GAUZE/BANDAGES/DRESSINGS) IMPLANT
BLADE BN FN 3.2XSTRL LF (MISCELLANEOUS) ×1 IMPLANT
BLADE BONE MILL FINE (MISCELLANEOUS) ×2
BLADE CLIPPER SURG (BLADE) IMPLANT
BLADE SURG 11 STRL SS (BLADE) ×3 IMPLANT
BUR MATCHSTICK NEURO 3.0 LAGG (BURR) ×3 IMPLANT
BUR PRECISION FLUTE 5.0 (BURR) IMPLANT
CANISTER SUCT 3000ML PPV (MISCELLANEOUS) ×3 IMPLANT
CLOSURE WOUND 1/2 X4 (GAUZE/BANDAGES/DRESSINGS)
CNTNR URN SCR LID CUP LEK RST (MISCELLANEOUS) ×1 IMPLANT
CONT SPEC 4OZ STRL OR WHT (MISCELLANEOUS) ×2
COVER BACK TABLE 60X90IN (DRAPES) ×3 IMPLANT
COVER WAND RF STERILE (DRAPES) IMPLANT
DECANTER SPIKE VIAL GLASS SM (MISCELLANEOUS) ×3 IMPLANT
DERMABOND ADVANCED (GAUZE/BANDAGES/DRESSINGS) ×4
DERMABOND ADVANCED .7 DNX12 (GAUZE/BANDAGES/DRESSINGS) ×2 IMPLANT
DEVICE INTERBODY ELEVATE 23X7 (Cage) ×3 IMPLANT
DRAIN JP 7F FLT 3/4 PRF SI HBL (DRAIN) ×3 IMPLANT
DRAPE C-ARM 42X72 X-RAY (DRAPES) ×6 IMPLANT
DRAPE C-ARMOR (DRAPES) IMPLANT
DRAPE LAPAROTOMY 100X72X124 (DRAPES) ×3 IMPLANT
DRAPE SURG 17X23 STRL (DRAPES) ×3 IMPLANT
DURAPREP 26ML APPLICATOR (WOUND CARE) ×3 IMPLANT
ELECT REM PT RETURN 9FT ADLT (ELECTROSURGICAL) ×3
ELECTRODE REM PT RTRN 9FT ADLT (ELECTROSURGICAL) ×1 IMPLANT
EVACUATOR SILICONE 100CC (DRAIN) ×3 IMPLANT
GAUZE 4X4 16PLY RFD (DISPOSABLE) IMPLANT
GAUZE SPONGE 4X4 12PLY STRL (GAUZE/BANDAGES/DRESSINGS) IMPLANT
GLOVE BIOGEL PI IND STRL 7.5 (GLOVE) ×2 IMPLANT
GLOVE BIOGEL PI INDICATOR 7.5 (GLOVE) ×4
GLOVE ECLIPSE 7.5 STRL STRAW (GLOVE) ×6 IMPLANT
GLOVE EXAM NITRILE LRG STRL (GLOVE) IMPLANT
GLOVE EXAM NITRILE XL STR (GLOVE) IMPLANT
GLOVE EXAM NITRILE XS STR PU (GLOVE) IMPLANT
GLOVE SURG POLYISO LF SZ6 (GLOVE) ×12 IMPLANT
GOWN STRL REUS W/ TWL LRG LVL3 (GOWN DISPOSABLE) ×4 IMPLANT
GOWN STRL REUS W/ TWL XL LVL3 (GOWN DISPOSABLE) IMPLANT
GOWN STRL REUS W/TWL 2XL LVL3 (GOWN DISPOSABLE) IMPLANT
GOWN STRL REUS W/TWL LRG LVL3 (GOWN DISPOSABLE) ×8
GOWN STRL REUS W/TWL XL LVL3 (GOWN DISPOSABLE)
HEMOSTAT POWDER KIT SURGIFOAM (HEMOSTASIS) ×3 IMPLANT
KIT BASIN OR (CUSTOM PROCEDURE TRAY) ×3 IMPLANT
KIT POSITION SURG JACKSON T1 (MISCELLANEOUS) ×3 IMPLANT
KIT TURNOVER KIT B (KITS) ×3 IMPLANT
MILL MEDIUM DISP (BLADE) ×3 IMPLANT
NEEDLE HYPO 18GX1.5 BLUNT FILL (NEEDLE) IMPLANT
NEEDLE HYPO 22GX1.5 SAFETY (NEEDLE) ×3 IMPLANT
NEEDLE SPNL 18GX3.5 QUINCKE PK (NEEDLE) IMPLANT
NS IRRIG 1000ML POUR BTL (IV SOLUTION) ×6 IMPLANT
PACK LAMINECTOMY NEURO (CUSTOM PROCEDURE TRAY) ×3 IMPLANT
PAD ARMBOARD 7.5X6 YLW CONV (MISCELLANEOUS) ×9 IMPLANT
RASP 3.0MM (RASP) ×3 IMPLANT
ROD SPINAL 5.5X100MM CP 4 TI (Cage) ×3 IMPLANT
ROD SPINAL TI CP4 NS 5.5X120 (Rod) ×3 IMPLANT
SCREW SET SOLERA (Screw) ×8 IMPLANT
SCREW SET SOLERA TI5.5 (Screw) ×4 IMPLANT
SCREW SOLERA 7.5X45 (Screw) ×6 IMPLANT
SCREW SOLERA 7.5X50 (Screw) ×6 IMPLANT
SPONGE LAP 4X18 RFD (DISPOSABLE) IMPLANT
SPONGE SURGIFOAM ABS GEL 100 (HEMOSTASIS) IMPLANT
STRIP CLOSURE SKIN 1/2X4 (GAUZE/BANDAGES/DRESSINGS) IMPLANT
SUT MNCRL AB 3-0 PS2 18 (SUTURE) ×3 IMPLANT
SUT MNCRL AB 3-0 PS2 27 (SUTURE) ×3 IMPLANT
SUT VIC AB 0 CT1 18XCR BRD8 (SUTURE) ×2 IMPLANT
SUT VIC AB 0 CT1 8-18 (SUTURE) ×4
SUT VIC AB 2-0 CP2 18 (SUTURE) ×9 IMPLANT
SUT VIC AB 2-0 CT2 18 VCP726D (SUTURE) IMPLANT
SYR 3ML LL SCALE MARK (SYRINGE) ×3 IMPLANT
TOWEL GREEN STERILE (TOWEL DISPOSABLE) ×3 IMPLANT
TOWEL GREEN STERILE FF (TOWEL DISPOSABLE) ×3 IMPLANT
TRAY FOLEY MTR SLVR 16FR STAT (SET/KITS/TRAYS/PACK) ×3 IMPLANT
WATER STERILE IRR 1000ML POUR (IV SOLUTION) ×3 IMPLANT

## 2020-08-18 NOTE — Anesthesia Postprocedure Evaluation (Signed)
Anesthesia Post Note  Patient: Krystal Ali  Procedure(s) Performed: Lumbar Four-Five Open Laminectomy, Lumbar Five- Sacral One pedicle screw placement, Lumbar Three to Sacral One Posterolateral fusion     Patient location during evaluation: PACU Anesthesia Type: General Level of consciousness: awake Pain management: pain level controlled Vital Signs Assessment: post-procedure vital signs reviewed and stable Respiratory status: spontaneous breathing Cardiovascular status: stable Postop Assessment: no apparent nausea or vomiting Anesthetic complications: no   No notable events documented.  Last Vitals:  Vitals:   08/18/20 1435 08/18/20 1450  BP: (!) 124/57 105/60  Pulse: 85 86  Resp: 17 17  Temp:    SpO2: 92% 95%    Last Pain:  Vitals:   08/18/20 1420  TempSrc:   PainSc: Asleep                 Sawyer Kahan

## 2020-08-18 NOTE — Plan of Care (Signed)
  Problem: Safety: Goal: Ability to remain free from injury will improve Outcome: Progressing   Problem: Education: Goal: Knowledge of General Education information will improve Description: Including pain rating scale, medication(s)/side effects and non-pharmacologic comfort measures Outcome: Progressing   Problem: Health Behavior/Discharge Planning: Goal: Ability to manage health-related needs will improve Outcome: Progressing   Problem: Clinical Measurements: Goal: Ability to maintain clinical measurements within normal limits will improve Outcome: Progressing Goal: Will remain free from infection Outcome: Progressing Goal: Diagnostic test results will improve Outcome: Progressing Goal: Respiratory complications will improve Outcome: Progressing Goal: Cardiovascular complication will be avoided Outcome: Progressing   Problem: Activity: Goal: Risk for activity intolerance will decrease Outcome: Progressing   Problem: Nutrition: Goal: Adequate nutrition will be maintained Outcome: Progressing   Problem: Coping: Goal: Level of anxiety will decrease Outcome: Progressing   Problem: Elimination: Goal: Will not experience complications related to bowel motility Outcome: Progressing Goal: Will not experience complications related to urinary retention Outcome: Progressing   Problem: Pain Managment: Goal: General experience of comfort will improve Outcome: Progressing   Problem: Safety: Goal: Ability to remain free from injury will improve Outcome: Progressing   Problem: Skin Integrity: Goal: Risk for impaired skin integrity will decrease Outcome: Progressing

## 2020-08-18 NOTE — Brief Op Note (Signed)
08/18/2020  12:51 PM  PATIENT:  Krystal Ali  75 y.o. female  PRE-OPERATIVE DIAGNOSIS:  Lumbar stenosis with neurogenic claudication  POST-OPERATIVE DIAGNOSIS:  Lumbar stenosis with neurogenic claudication  PROCEDURE:  Procedure(s): Lumbar Four-Five Open Laminectomy, Lumbar Five- Sacral One pedicle screw placement, Lumbar Three to Sacral One Posterolateral fusion (N/A)  SURGEON:  Surgeon(s) and Role:    * Judith Part, MD - Primary    * Eustace Moore, MD - Assisting  PHYSICIAN ASSISTANT:   ANESTHESIA:   general  EBL:  800 mL   BLOOD ADMINISTERED: 1U pRBC  DRAINS:  Subfascial drain    LOCAL MEDICATIONS USED:  LIDOCAINE   SPECIMEN:  No Specimen  DISPOSITION OF SPECIMEN:  N/A  COUNTS:  YES  TOURNIQUET:  * No tourniquets in log *  DICTATION: .Note written in EPIC  PLAN OF CARE: Admit to inpatient   PATIENT DISPOSITION:  PACU - hemodynamically stable.   Delay start of Pharmacological VTE agent (>24hrs) due to surgical blood loss or risk of bleeding: yes

## 2020-08-18 NOTE — H&P (Signed)
Surgical H&P Update  HPI: 75 y.o. 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 No new symptoms since I last saw her, subjectively feels like her symptoms are stable but her foot weakness seems worse on my exam this morning. No changes in health since she was last seen. Still having symptoms and wishes to proceed with surgery.  PMHx:  Past Medical History:  Diagnosis Date   Anxiety    Diabetes mellitus without complication ([facility])    GERD (gastroesophageal reflux disease)    Hyperlipidemia    Hypertension    FamHx:  Family History  Problem Relation Age of Onset   Heart disease Father    COPD Brother    SocHx:  reports that she has never smoked. She has never used smokeless tobacco. She reports previous alcohol use. She reports that she does not use drugs.  Physical Exam: AOx3, PERRL, FS, TM  Strength 5/5 in BUE, BLE 4+/5 proximally except RLE 4-/5 DF/PF/EHL and LLE 1/5 DF/PF/EHL with bilateral foot numbness  Assesment/Plan: 75 y.o. 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 Risks, benefits, and alternatives discussed and the patient would like to continue with surgery.  -OR today -4NP post-op  Judith Part, MD 08/18/20 6:59 AM

## 2020-08-18 NOTE — Transfer of Care (Signed)
Immediate Anesthesia Transfer of Care Note  Patient: Krystal Ali  Procedure(s) Performed: Lumbar Four-Five Open Laminectomy, Lumbar Five- Sacral One pedicle screw placement, Lumbar Three to Sacral One Posterolateral fusion  Patient Location: PACU  Anesthesia Type:General  Level of Consciousness: awake and oriented  Airway & Oxygen Therapy: Patient Spontanous Breathing  Post-op Assessment: Report given to RN and Patient moving all extremities X 4  Post vital signs: Reviewed and stable  Last Vitals:  Vitals Value Taken Time  BP 132/63 08/18/20 1321  Temp    Pulse 96 08/18/20 1328  Resp 17 08/18/20 1328  SpO2 93 % 08/18/20 1328  Vitals shown include unvalidated device data.  Last Pain:  Vitals:   08/18/20 0621  TempSrc:   PainSc: 6       Patients Stated Pain Goal: 2 (45/03/88 8280)  Complications: No notable events documented.

## 2020-08-18 NOTE — Anesthesia Procedure Notes (Signed)

## 2020-08-18 NOTE — Op Note (Signed)
PATIENT: Krystal Ali  DAY OF SURGERY: 08/18/20   PRE-OPERATIVE DIAGNOSIS:  Lumbar radiculopathy, adjacent segment disease   POST-OPERATIVE DIAGNOSIS:  Same   PROCEDURE:  L4, L5 laminectomies, L5 and S1 pedicle screw placement, L2-S1 posterolateral fusion, L4-5 TLIF, use of intra-op O-arm CT and frameless navigation   SURGEON:  Surgeon(s) and Role:    Judith Part, MD - Primary    Sherley Bounds, MD - Assisting   ANESTHESIA: ETGA   BRIEF HISTORY: This is a 75 year old woman who presented with progressive low back pain, lower extremity pain, and fairly impressive foot weakness. 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 I therefore recommended decompression and extension of her fusion to bridge this isolated segment between lever arms. This was discussed with the patient and her family at length as well as risks, benefits, and alternatives and wished to proceed with surgery.   OPERATIVE DETAIL: The patient was taken to the operating room and placed on the OR table in the prone position. A formal time out was performed with two patient identifiers and confirmed the operative site. Anesthesia was induced by the anesthesia team. The operative site was marked, hair was clipped with surgical clippers, the area was then prepped and draped in a sterile fashion.   The patient's prior incision was opened and extended caudally to expose the distal end of her prior construct from L2 down to L4 and then L5 and S1.   Decompression was performed, which consisted of laminectomies at L4 and L5 with bilateral foraminotomies at L4-5 and L5-S1. At L4-5, there was significant foraminal stenosis so facetectomies were performed bialterally.   Instrumentation was then performed, which consisted of placing new pedicle screws at L5 and S1. These were placed using the  O-arm and Stealth navigation. A reference array was attached to the S2 spinous process, the O-arm was brought in, a scan was obtained, and registered on the Stealth platform. The pedicle screws were then drilled, tapped, and the screw was left out to avoid the screw heads from being in the way, as I wanted to drill the tracts prior to manipulation to maximize accuracy.   Given the extent of the required facetectomy and the stress on the construct, I thought it best to place an interbody at L4-5. I therefore used navigated instruments with a transforaminal approach to perform a complete discectomy, endplate preparation, and placed a guided interbody ([employer]) that was expanded, finishing the L4-5 TLIF.  Dr. Ronnald Ramp scrubbed in to assist with screw placement and the difficult removal of the rods and placement of new rods. The rods were grown in bilaterally and had to be drilled out.   The existing rod was then cut on the at L1-2 on the right and between L1-L3 on the left (given that there was no L2 screw on that side) to stagger the stress on the prior construct. The caps were removed and the rods were removed then replaced with new rods spanning from L2-S1 and L3-S1, respectively. The caps were replaced in the old hardware with new caps in the new hardware and torqued accordingly. The fusion surfaces were decorticated and the previously resected bone was morselized to be used as autograft.  The incision was copiously irrigated, a drain was placed subfascially and tunneled superiorly, then secured. All instrument and sponge counts were correct, the incision  was then closed in layers. The patient was then returned to anesthesia for emergence. No apparent complications at the completion of the procedure.   EBL:  860mL   DRAINS: Subfascial #7 flat drain with JP bulb   SPECIMENS: none   Judith Part, MD 08/18/20 7:01 AM

## 2020-08-19 LAB — RENAL FUNCTION PANEL
Albumin: 2.8 g/dL — ABNORMAL LOW (ref 3.5–5.0)
Anion gap: 8 (ref 5–15)
BUN: 21 mg/dL (ref 8–23)
CO2: 23 mmol/L (ref 22–32)
Calcium: 7.8 mg/dL — ABNORMAL LOW (ref 8.9–10.3)
Chloride: 104 mmol/L (ref 98–111)
Creatinine, Ser: 0.81 mg/dL (ref 0.44–1.00)
GFR, Estimated: >60 mL/min (ref >60)
Glucose, Bld: 87 mg/dL (ref 70–99)
Phosphorus: 3.1 mg/dL (ref 2.5–4.6)
Potassium: 4.1 mmol/L (ref 3.5–5.1)
Sodium: 135 mmol/L (ref 135–145)

## 2020-08-19 LAB — CBC
HCT: 26.1 % — ABNORMAL LOW (ref 36.0–46.0)
Hemoglobin: 9 g/dL — ABNORMAL LOW (ref 12.0–15.0)
MCH: 32.4 pg (ref 26.0–34.0)
MCHC: 34.5 g/dL (ref 30.0–36.0)
MCV: 93.9 fL (ref 80.0–100.0)
Platelets: 181 10*3/uL (ref 150–400)
RBC: 2.78 MIL/uL — ABNORMAL LOW (ref 3.87–5.11)
RDW: 15 % (ref 11.5–15.5)
WBC: 8.7 10*3/uL (ref 4.0–10.5)
nRBC: 0 % (ref 0.0–0.2)

## 2020-08-19 NOTE — Progress Notes (Deleted)
Patient was found OOB trying to go to bathroom placed patient back in bed and readjusted her purwick explained that this will help to get her pee and that she doesn't need to get OOB to pee. She voiced that she understood. Placed on bed alarm and informed patient that bed will alarm if she tries to get out of bed. Call bell placed in her hand showed how to call for help. Arthor Captain LPN

## 2020-08-19 NOTE — Evaluation (Signed)
Physical Therapy Evaluation Patient Details Name: Krystal Ali MRN: [id number] DOB: Sep 21, 1945 Today's Date: 08/19/2020   History of Present Illness  75 yo female presenting 6/10 with low back pain and bil lower extremity pain, numbness, and weakness. S/p L4-5 lami, L4-5 TLIF, and L2-S1 fusion 6/10. PMH including anxiety, DM, and HTN.   Clinical Impression  Pt presents with condition above and deficits mentioned below, see PT Problem List. PTA, she was living with her daughter, performing lateral scoot transfers to w/c independently, and utilizing a w/c for mobility. Pt's daughter works during the day. Currently, pt requires Min A +2 for functional transfers. She displays deficits in sensation in bil lower extremities along with generalized bil lower extremity weakness, particularly in her L leg. She also displays deficits in coordination, balance, and activity tolerance that place her at risk for falls. Provided education and handout on back precautions. Recommend d/c to SNF for further PT to optimize safety and independence with all functional mobility. Will continue to follow acutely.    Follow Up Recommendations SNF;Supervision for mobility/OOB    Equipment Recommendations  None recommended by PT (defer to next venue of care)    Recommendations for Other Services       Precautions / Restrictions Precautions Precautions: Fall;Back Precaution Booklet Issued: Yes (comment) Precaution Comments: x1 JP drain at back; Provided handout and reviewed back precautions Required Braces or Orthoses: Other Brace Other Brace: No brace per order Restrictions Weight Bearing Restrictions: No      Mobility  Bed Mobility Overal bed mobility: Needs Assistance Bed Mobility: Rolling;Sidelying to Sit Rolling: Min assist Sidelying to sit: Min assist;+2 for physical assistance       General bed mobility comments: Min A for completely rolling onto R. Min A +2 to bring bil legs over EOB and then  elevate trunk    Transfers Overall transfer level: Needs assistance Equipment used: 2 person hand held assist Transfers: Sit to/from Stand;Lateral/Scoot Transfers;Squat Pivot Transfers Sit to Stand: Min assist;+2 physical assistance   Squat pivot transfers: Min assist;+2 physical assistance    Lateral/Scoot Transfers: Min assist;+2 physical assistance General transfer comment: Min A +2 to laterally scoot toward R; drop arm recliner minAx2 to squat pivot towards R bed > recliner. Pt performing sit<>stand x2 with Min A +2 for gaining balance once in standing, hugging onto therapists for support.  Ambulation/Gait             General Gait Details: Unable  Financial trader Rankin (Stroke Patients Only)       Balance Overall balance assessment: Needs assistance Sitting-balance support: No upper extremity supported;Feet supported Sitting balance-Leahy Scale: Good     Standing balance support: Bilateral upper extremity supported;During functional activity Standing balance-Leahy Scale: Poor Standing balance comment: reliant on physical A and UE support                             Pertinent Vitals/Pain Pain Assessment: Faces Faces Pain Scale: Hurts little more Pain Location: Back Pain Descriptors / Indicators: Discomfort;Grimacing Pain Intervention(s): Limited activity within patient's tolerance;Monitored during session;Repositioned    Home Living Family/patient expects to be discharged to:: Skilled nursing facility Living Arrangements: Children Available Help at Discharge: Family;Available PRN/intermittently Type of Home: House         Home Equipment: Wheelchair - manual (drop arm w/c)      Prior  Function Level of Independence: Independent with assistive device(s)         Comments: Pt performing mobility with w/c. Lateral scoots to/from w/c. Pt reports that she performs all BADLs including bathing, dressing,  and toilet hygiene (after incontenience).     Hand Dominance        Extremity/Trunk Assessment   Upper Extremity Assessment Upper Extremity Assessment: Overall WFL for tasks assessed    Lower Extremity Assessment Lower Extremity Assessment: RLE deficits/detail;LLE deficits/detail RLE Deficits / Details: Generalized weakness of 3+ to 4 with MMT grossly; numb to light touch distally but able to detect touch at medial ankle RLE Sensation: decreased light touch;decreased proprioception RLE Coordination: decreased gross motor LLE Deficits / Details: Generalized weakness with MMT of 0 in gastrocs and anterior tibialis but 3+ to 4 elsewhere grossly; numb to light touch distally LLE Sensation: decreased light touch;decreased proprioception LLE Coordination: decreased gross motor    Cervical / Trunk Assessment Cervical / Trunk Assessment: Other exceptions Cervical / Trunk Exceptions: s/p spinal surgery  Communication   Communication: No difficulties  Cognition Arousal/Alertness: Awake/alert Behavior During Therapy: WFL for tasks assessed/performed;Anxious Overall Cognitive Status: Impaired/Different from baseline Area of Impairment: Safety/judgement                         Safety/Judgement: Decreased awareness of safety     General Comments: Pt reporting she feels loopy from medication. Appologizing for being loopy. Able to follow simple commands. Benefiting from encouragement for transfers as pt was nervous.      General Comments      Exercises     Assessment/Plan    PT Assessment Patient needs continued PT services  PT Problem List Decreased strength;Decreased range of motion;Decreased activity tolerance;Decreased balance;Decreased mobility;Decreased coordination;Decreased cognition;Decreased safety awareness;Decreased knowledge of precautions;Decreased knowledge of use of DME;Impaired sensation;Pain       PT Treatment Interventions DME instruction;Gait  training;Functional mobility training;Therapeutic activities;Therapeutic exercise;Balance training;Neuromuscular re-education;Cognitive remediation;Patient/family education;Wheelchair mobility training    PT Goals (Current goals can be found in the Care Plan section)  Acute Rehab PT Goals Patient Stated Goal: Go to rehab and get stronger PT Goal Formulation: With patient Time For Goal Achievement: 09/02/20 Potential to Achieve Goals: Good    Frequency Min 5X/week   Barriers to discharge        Co-evaluation PT/OT/SLP Co-Evaluation/Treatment: Yes Reason for Co-Treatment: For patient/therapist safety;To address functional/ADL transfers PT goals addressed during session: Mobility/safety with mobility;Balance OT goals addressed during session: ADL's and self-care       AM-PAC PT "6 Clicks" Mobility  Outcome Measure Help needed turning from your back to your side while in a flat bed without using bedrails?: A Little Help needed moving from lying on your back to sitting on the side of a flat bed without using bedrails?: A Little Help needed moving to and from a bed to a chair (including a wheelchair)?: A Lot Help needed standing up from a chair using your arms (e.g., wheelchair or bedside chair)?: A Lot Help needed to walk in hospital room?: Total Help needed climbing 3-5 steps with a railing? : Total 6 Click Score: 12    End of Session Equipment Utilized During Treatment: Gait belt Activity Tolerance: Patient tolerated treatment well Patient left: in chair;with call bell/phone within reach;with chair alarm set Nurse Communication: Mobility status PT Visit Diagnosis: Unsteadiness on feet (R26.81);Muscle weakness (generalized) (M62.81);Difficulty in walking, not elsewhere classified (R26.2);Other symptoms and signs involving the nervous system (R29.898);Pain Pain -  part of body:  (back)    Time: 8381-8403 PT Time Calculation (min) (ACUTE ONLY): 21 min   Charges:   PT  Evaluation $PT Eval Moderate Complexity: 1 Mod          Moishe Spice, PT, DPT Acute Rehabilitation Services  Pager: [phone] Office: [place] 08/19/2020, 4:56 PM

## 2020-08-19 NOTE — Plan of Care (Signed)
  Problem: Safety: Goal: Ability to remain free from injury will improve Outcome: Progressing   Problem: Education: Goal: Knowledge of General Education information will improve Description: Including pain rating scale, medication(s)/side effects and non-pharmacologic comfort measures Outcome: Progressing   Problem: Health Behavior/Discharge Planning: Goal: Ability to manage health-related needs will improve Outcome: Progressing   Problem: Clinical Measurements: Goal: Ability to maintain clinical measurements within normal limits will improve Outcome: Progressing Goal: Will remain free from infection Outcome: Progressing Goal: Diagnostic test results will improve Outcome: Progressing Goal: Respiratory complications will improve Outcome: Progressing Goal: Cardiovascular complication will be avoided Outcome: Progressing   Problem: Activity: Goal: Risk for activity intolerance will decrease Outcome: Progressing   Problem: Nutrition: Goal: Adequate nutrition will be maintained Outcome: Progressing   Problem: Coping: Goal: Level of anxiety will decrease Outcome: Progressing   Problem: Elimination: Goal: Will not experience complications related to bowel motility Outcome: Progressing Goal: Will not experience complications related to urinary retention Outcome: Progressing   Problem: Pain Managment: Goal: General experience of comfort will improve Outcome: Progressing   Problem: Safety: Goal: Ability to remain free from injury will improve Outcome: Progressing   Problem: Skin Integrity: Goal: Risk for impaired skin integrity will decrease Outcome: Progressing

## 2020-08-19 NOTE — Evaluation (Signed)
Occupational Therapy Evaluation Patient Details Name: Krystal Ali MRN: [id number] DOB: 16-Dec-1945 Today's Date: 08/19/2020    History of Present Illness 75 yo female presenting low back pain and BLE pain, numbness, and weakness. S/p L4-5 lami, L4-5 TLIF, and L2-S1 fusion. PMH including anxiety, DM,   and HTN.   Clinical Impression   PTA, pt was living with her daughter and was independent with BADLs and lateral scoots to w/c; daughter works during the day. Currently, pt requires Max A +2 for LB ADLs and Min A +2 for functional transfers. Provided education and handout on back precautions and compensatory techniques. Pt would benefit from further acute OT to facilitate safe dc. Recommend dc to SNF for further OT to optimize safety, independence with ADLs, and return to PLOF.    Follow Up Recommendations  SNF    Equipment Recommendations  None recommended by OT    Recommendations for Other Services PT consult     Precautions / Restrictions Precautions Precautions: Fall;Back Precaution Booklet Issued: Yes (comment) Precaution Comments: Provided handout and reviewed back precautions Required Braces or Orthoses: Other Brace Other Brace: No brace per order      Mobility Bed Mobility Overal bed mobility: Needs Assistance Bed Mobility: Rolling;Sidelying to Sit Rolling: Min assist Sidelying to sit: Min assist;+2 for physical assistance       General bed mobility comments: Min A for completely rolling onto R. Min A +2 to bring BLes over EOB and then elevate trunk    Transfers Overall transfer level: Needs assistance Equipment used: 2 person hand held assist Transfers: Sit to/from Stand;Lateral/Scoot Transfers Sit to Stand: Min assist;+2 physical assistance        Lateral/Scoot Transfers: Min assist;+2 physical assistance General transfer comment: Min A +2 to laterally scoot toward R; drop arm recliner. Pt performing sit<>stand x2 with MIn A +2 for gaining balance once in  standing    Balance Overall balance assessment: Needs assistance Sitting-balance support: No upper extremity supported;Feet supported Sitting balance-Leahy Scale: Good     Standing balance support: Bilateral upper extremity supported;During functional activity Standing balance-Leahy Scale: Poor Standing balance comment: reliant on physical A                           ADL either performed or assessed with clinical judgement   ADL Overall ADL's : Needs assistance/impaired Eating/Feeding: Set up;Sitting   Grooming: Set up;Supervision/safety;Sitting;Wash/dry face   Upper Body Bathing: Minimal assistance;Sitting   Lower Body Bathing: Maximal assistance;Sit to/from stand   Upper Body Dressing : Min guard;Sitting   Lower Body Dressing: Maximal assistance;Sit to/from stand Lower Body Dressing Details (indicate cue type and reason): Pt close to performing figure four but with difficulty and effort. Toilet Transfer: Minimal assistance;Transfer board;+2 for physical assistance           Functional mobility during ADLs: Minimal assistance;+2 for physical assistance (Performing lateral scoot to drop arm recliner) General ADL Comments: Pt presenitng with decreased balance, strength, and activity tolerance. Pt very motivated     Museum/gallery curator      Pertinent Vitals/Pain Pain Assessment: Faces Faces Pain Scale: Hurts little more Pain Location: Back Pain Descriptors / Indicators: Discomfort;Grimacing Pain Intervention(s): Monitored during session;Limited activity within patient's tolerance;Repositioned     Hand Dominance     Extremity/Trunk Assessment Upper Extremity Assessment Upper Extremity Assessment: Overall WFL for tasks assessed   Lower Extremity  Assessment Lower Extremity Assessment: Defer to PT evaluation       Communication Communication Communication: No difficulties   Cognition Arousal/Alertness: Awake/alert Behavior  During Therapy: WFL for tasks assessed/performed Overall Cognitive Status: Impaired/Different from baseline Area of Impairment: Safety/judgement                         Safety/Judgement: Decreased awareness of safety     General Comments: Pt reporting she feels loopy from medication. Appologizing for being loopy. Able to follow simple commands. Benefiting from encouragement for transfers as pt was nervous.   General Comments       Exercises     Shoulder Instructions      Home Living Family/patient expects to be discharged to:: Skilled nursing facility Living Arrangements: Children Available Help at Discharge: Family;Available PRN/intermittently Type of Home: House                       Home Equipment: Wheelchair - manual (drop arm w/c)          Prior Functioning/Environment Level of Independence: Independent with assistive device(s)        Comments: Pt performing mobility with w/c. Lateral scoots to/from w/c. Pt reports that she performs all BADLs including bathing, dressing, and toilet hygiene (after incontenience).        OT Problem List: Decreased strength;Decreased range of motion;Decreased activity tolerance;Impaired balance (sitting and/or standing);Decreased safety awareness;Decreased knowledge of use of DME or AE;Decreased knowledge of precautions;Pain      OT Treatment/Interventions: Self-care/ADL training;Therapeutic exercise;Energy conservation;DME and/or AE instruction;Therapeutic activities;Patient/family education    OT Goals(Current goals can be found in the care plan section) Acute Rehab OT Goals Patient Stated Goal: Go to rehab and get stronger OT Goal Formulation: With patient Time For Goal Achievement: 09/02/20 Potential to Achieve Goals: Good  OT Frequency: Min 2X/week   Barriers to D/C:            Co-evaluation PT/OT/SLP Co-Evaluation/Treatment: Yes Reason for Co-Treatment: For patient/therapist safety;To address  functional/ADL transfers   OT goals addressed during session: ADL's and self-care      AM-PAC OT "6 Clicks" Daily Activity     Outcome Measure Help from another person eating meals?: A Little Help from another person taking care of personal grooming?: A Little Help from another person toileting, which includes using toliet, bedpan, or urinal?: A Lot Help from another person bathing (including washing, rinsing, drying)?: A Lot Help from another person to put on and taking off regular upper body clothing?: A Little Help from another person to put on and taking off regular lower body clothing?: A Lot 6 Click Score: 15   End of Session Equipment Utilized During Treatment: Gait belt Nurse Communication: Mobility status  Activity Tolerance: Patient tolerated treatment well Patient left: in chair;with chair alarm set;with call bell/phone within reach  OT Visit Diagnosis: Unsteadiness on feet (R26.81);Other abnormalities of gait and mobility (R26.89);Muscle weakness (generalized) (M62.81);History of falling (Z91.81);Pain Pain - part of body:  (Back)                Time: 0981-1914 OT Time Calculation (min): 22 min Charges:  OT General Charges $OT Visit: 1 Visit OT Evaluation $OT Eval Moderate Complexity: Jerome, OTR/L Acute Rehab Pager: [phone] Office: [place] 08/19/2020, 3:16 PM

## 2020-08-19 NOTE — Progress Notes (Signed)
Overall looking little better.  Back pain well controlled.  She is afebrile.  Her vital signs are stable.  Urine output is good.  She is awake and alert.  Her left-sided dorsiflexion weakness is stable.  Otherwise her motor strength is stable from preop.  Sensory examination nonfocal.  Abdomen soft.  Wound clean and dry.  Overall progressing well.  Continue efforts at slow mobilization following multilevel lumbar decompression and fusion surgery.

## 2020-08-20 NOTE — Progress Notes (Signed)
About the same as yesterday.  Still with quite a bit of postoperative back pain and some lower extremity symptoms.  Still has some weakness of dorsiflexion and plantarflexion on her left side.  Right-sided movement appears to be intact.  Her wound is clean and dry.  Her drain has been removed.  She is afebrile.  Her urine output is good.  Her vital signs are stable.  Progressing slowly following multilevel lumbar decompression and fusion.  Patient with chronic incontinence and I think leaving her catheter in is her best option with regard to possible skin breakdown and wound maceration.

## 2020-08-21 MED ORDER — GABAPENTIN 100 MG PO CAPS
100.0000 mg | ORAL_CAPSULE | Freq: Every day | ORAL | Status: DC
  Administered 2020-08-21 – 2020-08-23 (×3): 100 mg via ORAL
  Filled 2020-08-21 (×3): qty 1

## 2020-08-21 NOTE — Care Management Important Message (Signed)
Important Message  Patient Details  Name: Krystal Ali MRN: [id number] Date of Birth: May 24, 1945   Medicare Important Message Given:  Yes     Kahle Mcqueen [address]ine Circle 08/21/2020, 3:49 PM

## 2020-08-21 NOTE — Progress Notes (Signed)
Physical Therapy Treatment Patient Details Name: Krystal Ali MRN: [id number] DOB: 1946-02-27 Today's Date: 08/21/2020    History of Present Illness 75 yo female presenting 6/10 with low back pain and bil lower extremity pain, numbness, and weakness. S/p L4-5 lami, L4-5 TLIF, and L2-S1 fusion 6/10. PMH including anxiety, DM, and HTN.    PT Comments    Due to fear of falling and decreased initiation, utilized Denna Haggard to progress pt out of bed to chair today, which she performed with good carry over. Continues with 0/5 left ankle dorsiflexion or plantarflexion, but reports "they don't feel cold anymore." Pt able to participate in seated exercises for BLE strengthening. Continue to recommend SNF for ongoing Physical Therapy.      Follow Up Recommendations  SNF;Supervision for mobility/OOB     Equipment Recommendations  None recommended by PT (defer to next venue of care)    Recommendations for Other Services       Precautions / Restrictions Precautions Precautions: Fall;Back Precaution Booklet Issued: Yes (comment) Required Braces or Orthoses: Other Brace Other Brace: No brace per order Restrictions Weight Bearing Restrictions: No    Mobility  Bed Mobility Overal bed mobility: Needs Assistance Bed Mobility: Sidelying to Sit   Sidelying to sit: Mod assist       General bed mobility comments: Pt in R sidelying upon entrance, cues for bringing BLE's off edge of bed and modA at trunk to boost to upright    Transfers Overall transfer level: Needs assistance Equipment used: Ambulation equipment used Transfers: Sit to/from Stand Sit to Stand: Min assist         General transfer comment: Attempted to laterally scoot and stand from edge of bed, but pt with fear of falling and decreased initiation. Michaelyn Barter, where she was minA to boost up from elevated bed height  Ambulation/Gait             General Gait Details: Unable   Stairs              Wheelchair Mobility    Modified Rankin (Stroke Patients Only)       Balance Overall balance assessment: Needs assistance Sitting-balance support: No upper extremity supported;Feet supported Sitting balance-Leahy Scale: Fair                                      Cognition Arousal/Alertness: Awake/alert   Overall Cognitive Status: Impaired/Different from baseline Area of Impairment: Safety/judgement                         Safety/Judgement: Decreased awareness of safety     General Comments: Fear of falling, anxious behaviors      Exercises General Exercises - Lower Extremity Long Arc Quad: Both;10 reps;Seated Hip Flexion/Marching: Both;10 reps;Seated    General Comments        Pertinent Vitals/Pain Pain Assessment: Faces Faces Pain Scale: Hurts little more Pain Location: Back Pain Descriptors / Indicators: Discomfort;Grimacing Pain Intervention(s): Monitored during session    Home Living                      Prior Function            PT Goals (current goals can now be found in the care plan section) Acute Rehab PT Goals Patient Stated Goal: Go to rehab and get stronger PT Goal Formulation: With patient  Time For Goal Achievement: 09/02/20 Potential to Achieve Goals: Good Progress towards PT goals: Progressing toward goals    Frequency    Min 5X/week      PT Plan Current plan remains appropriate    Co-evaluation              AM-PAC PT "6 Clicks" Mobility   Outcome Measure  Help needed turning from your back to your side while in a flat bed without using bedrails?: A Little Help needed moving from lying on your back to sitting on the side of a flat bed without using bedrails?: A Lot Help needed moving to and from a bed to a chair (including a wheelchair)?: A Lot Help needed standing up from a chair using your arms (e.g., wheelchair or bedside chair)?: A Lot Help needed to walk in hospital room?:  Total Help needed climbing 3-5 steps with a railing? : Total 6 Click Score: 11    End of Session Equipment Utilized During Treatment: Gait belt Activity Tolerance: Patient tolerated treatment well Patient left: in chair;with call bell/phone within reach;with chair alarm set Nurse Communication: Mobility status PT Visit Diagnosis: Unsteadiness on feet (R26.81);Muscle weakness (generalized) (M62.81);Difficulty in walking, not elsewhere classified (R26.2);Other symptoms and signs involving the nervous system (R29.898);Pain Pain - part of body:  (back)     Time: 3716-9678 PT Time Calculation (min) (ACUTE ONLY): 24 min  Charges:  $Therapeutic Activity: 23-37 mins                     Wyona Almas, PT, DPT Acute Rehabilitation Services Pager [phone] Office [phone]    [name] 08/21/2020, 4:40 PM

## 2020-08-21 NOTE — NC FL2 (Signed)
[place] MEDICAID FL2 LEVEL OF CARE SCREENING TOOL     IDENTIFICATION  Patient Name: Krystal Ali Birthdate: 04-13-1945 Sex: female Admission Date (Current Location): 08/18/2020  [facility] and [place] Number:  Herbalist and Address:  The [place], [place] [address] [place], [place] [address]      Provider Number: [id number]  Attending Physician Name and Address:  Judith Part, MD  Relative Name and Phone Number:  Jeri Modena (Daughter)   [phone] [facility])    Current Level of Care: Hospital Recommended Level of Care: [place] Prior Approval Number:    Date Approved/Denied:   PASRR Number: pending  Discharge Plan: SNF    Current Diagnoses: Patient Active Problem List   Diagnosis Date Noted   Lumbar stenosis with neurogenic claudication 08/18/2020   Anxiety 07/18/2020   Depression, recurrent ([place]) 07/18/2020   Chronic low back pain with right-sided sciatica 05/01/2020   Overactive bladder 02/15/2020   Diabetes mellitus without complication ([place]) 57/84/6962   History of back surgery 02/15/2020    Orientation RESPIRATION BLADDER Height & Weight     Self, Time, Situation, Place  Normal Indwelling catheter Weight: 160 lb 15 oz (73 kg) Height:  5\' 5"  (165.1 cm)  BEHAVIORAL SYMPTOMS/MOOD NEUROLOGICAL BOWEL NUTRITION STATUS      Continent Diet (see d/c summary)  AMBULATORY STATUS COMMUNICATION OF NEEDS Skin   Extensive Assist Verbally Surgical wounds (incision, back)                       Personal Care Assistance Level of Assistance  Bathing, Feeding, Dressing Bathing Assistance: Maximum assistance Feeding assistance: Independent Dressing Assistance: Limited assistance     Functional Limitations Info  Sight, Hearing, Speech Sight Info: Adequate Hearing Info: Impaired Speech Info: Adequate    SPECIAL CARE FACTORS FREQUENCY  PT (By licensed PT), OT (By licensed OT)     PT Frequency:  5x/week OT Frequency: 5x/week            Contractures Contractures Info: Not present    Additional Factors Info  Code Status, Allergies Code Status Info: Full code Allergies Info: Demerol (meperidine Hcl), morphine and related           Current Medications (08/21/2020):  This is the current hospital active medication list Current Facility-Administered Medications  Medication Dose Route Frequency Provider Last Rate Last Admin   0.9 %  sodium chloride infusion (Manually program via Guardrails IV Fluids)   Intravenous Once Barrington Ellison, CRNA       0.9 %  sodium chloride infusion (Manually program via Guardrails IV Fluids)   Intravenous Once Barrington Ellison, CRNA       0.9 %  sodium chloride infusion  250 mL Intravenous Continuous Judith Part, MD 10 mL/hr at 08/18/20 1746 250 mL at 08/18/20 1746   acetaminophen (TYLENOL) tablet 650 mg  650 mg Oral Q4H PRN Judith Part, MD   650 mg at 08/21/20 0558   Or   acetaminophen (TYLENOL) suppository 650 mg  650 mg Rectal Q4H PRN Judith Part, MD       Chlorhexidine Gluconate Cloth 2 % PADS 6 each  6 each Topical Q0600 Judith Part, MD   6 each at 08/20/20 0510   cyclobenzaprine (FLEXERIL) tablet 10 mg  10 mg Oral TID PRN Judith Part, MD   10 mg at 08/21/20 0054   docusate sodium (COLACE) capsule 100 mg  100 mg  Oral BID Judith Part, MD   100 mg at 08/21/20 0843   HYDROmorphone (DILAUDID) injection 1 mg  1 mg Intravenous Q3H PRN Judith Part, MD   1 mg at 08/20/20 0530   menthol-cetylpyridinium (CEPACOL) lozenge 3 mg  1 lozenge Oral PRN Judith Part, MD       Or   phenol (CHLORASEPTIC) mouth spray 1 spray  1 spray Mouth/Throat PRN Judith Part, MD       mirabegron ER (MYRBETRIQ) tablet 50 mg  50 mg Oral Daily Judith Part, MD   50 mg at 08/21/20 0842   mupirocin ointment (BACTROBAN) 2 % 1 application  1 application Nasal BID Judith Part, MD   1 application at  16/10/96 0843   ondansetron (ZOFRAN) tablet 4 mg  4 mg Oral Q6H PRN Judith Part, MD       Or   ondansetron (ZOFRAN) injection 4 mg  4 mg Intravenous Q6H PRN Judith Part, MD   4 mg at 08/18/20 2343   oxyCODONE (Oxy IR/ROXICODONE) immediate release tablet 10 mg  10 mg Oral Q4H PRN Judith Part, MD   10 mg at 08/21/20 1221   oxyCODONE (Oxy IR/ROXICODONE) immediate release tablet 5 mg  5 mg Oral Q4H PRN Judith Part, MD   5 mg at 08/21/20 0558   polyethylene glycol (MIRALAX / GLYCOLAX) packet 17 g  17 g Oral Daily PRN Judith Part, MD       sodium chloride flush (NS) 0.9 % injection 3 mL  3 mL Intravenous Q12H Judith Part, MD   3 mL at 08/21/20 0843   sodium chloride flush (NS) 0.9 % injection 3 mL  3 mL Intravenous PRN Judith Part, MD       venlafaxine XR (EFFEXOR-XR) 24 hr capsule 150 mg  150 mg Oral Q breakfast Judith Part, MD   150 mg at 08/21/20 0454     Discharge Medications: Please see discharge summary for a list of discharge medications.  Relevant Imaging Results:  Relevant Lab Results:   Additional Information SSN [id number] unvaccinated  [place], [place]

## 2020-08-21 NOTE — TOC Initial Note (Addendum)
Transition of Care [facility]) - Initial/Assessment Note    Patient Details  Name: Krystal Ali MRN: [id number] Date of Birth: 1945/10/22  [facility]) CM/SW Contact:    Bethann Berkshire, [place] Phone Number: 08/21/2020, 1:20 PM  Clinical Narrative:                  CSW met with pt to discuss SNF recommendation. Pt consents for CSW to talk with daughter. CSW calls daughter and includes her on speaker phone. Pt has been to SNF about 3 years ago when she lived in [place]. She moved to [place] to live with her daughter 1 year ago. Pt states she has not walked in 5-6 years. She has limited ability to transfer independently. Daughter states she works 4am-4pm and is unable to take care of pt longterm. Pt is seeking SNF for rehab then LTC. Pt is not vaccinated. Daughter and pt are agreeable to SNF workup for [place] and [place] areas CSW completed FL2 and faxed bed requests in the hub.   Expected Discharge Plan: Skilled Nursing Facility Barriers to Discharge: Continued Medical Work up   Patient Goals and CMS Choice Patient states their goals for this hospitalization and ongoing recovery are:: Wants SNF facility   Choice offered to / list presented to : Patient  Expected Discharge Plan and Services Expected Discharge Plan: [place]       Living arrangements for the past 2 months: Single Family Home                                      Prior Living Arrangements/Services Living arrangements for the past 2 months: Single Family Home Lives with:: Adult Children Patient language and need for interpreter reviewed:: Yes Do you feel safe going back to the place where you live?: No   Says she can't be cared for at home  Need for Family Participation in Patient Care: Yes (Comment) Care giver support system in place?: No (comment) Current home services: DME Criminal Activity/Legal Involvement Pertinent to Current Situation/Hospitalization: No - Comment as  needed  Activities of Daily Living Home Assistive Devices/Equipment: Wheelchair, CBG Meter ADL Screening (condition at time of admission) Patient's cognitive ability adequate to safely complete daily activities?: Yes Is the patient deaf or have difficulty hearing?: No Does the patient have difficulty seeing, even when wearing glasses/contacts?: No Does the patient have difficulty concentrating, remembering, or making decisions?: No Patient able to express need for assistance with ADLs?: No Does the patient have difficulty dressing or bathing?: No Independently performs ADLs?: Yes (appropriate for developmental age) Does the patient have difficulty walking or climbing stairs?: Yes Weakness of Legs: Both Weakness of Arms/Hands: None  Permission Sought/Granted Permission sought to share information with : Family Supports Permission granted to share information with : Yes, Verbal Permission Granted  Share Information with NAME: Jeri Modena (Daughter)   [phone] (Mobile)           Emotional Assessment Appearance:: Appears stated age Attitude/Demeanor/Rapport: Engaged Affect (typically observed): Anxious, Appropriate Orientation: : Oriented to Self, Oriented to Place, Oriented to  Time, Oriented to Situation Alcohol / Substance Use: Not Applicable Psych Involvement: No (comment)  Admission diagnosis:  Lumbar stenosis with neurogenic claudication [M48.062] Patient Active Problem List   Diagnosis Date Noted   Lumbar stenosis with neurogenic claudication 08/18/2020   Anxiety 07/18/2020   Depression, recurrent ([place]) 07/18/2020   Chronic low  back pain with right-sided sciatica 05/01/2020   Overactive bladder 02/15/2020   Diabetes mellitus without complication ([place]) 03/70/4888   History of back surgery 02/15/2020   PCP:  [name], NP Pharmacy:   [facility] [address], [place] [address] [place] [address] Phone: [phone] Fax:  [phone]  [id number] [address] [id number]: [phone]: [phone]    Social Determinants of Health (SDOH) Interventions    Readmission Risk Interventions No flowsheet data found.

## 2020-08-21 NOTE — Progress Notes (Signed)
       RE:  Krystal Ali     Date of Birth:  11/07/1945   Date:  08/21/2020       To Whom It May Concern:  Please be advised that the above-named patient will require a short-term nursing home stay - anticipated 30 days or less for rehabilitation and strengthening.  The plan is for return home.

## 2020-08-21 NOTE — Progress Notes (Signed)
Neurosurgery Service Progress Note  Subjective: No acute events overnight. Back pain is tolerable, leg pain improving, both better than preop   Objective: Vitals:   08/20/20 1956 08/20/20 2302 08/21/20 0300 08/21/20 0744  BP: (!) 130/99 114/87 118/87 (!) 116/94  Pulse: 94  98 94  Resp:  20  16  Temp: (!) 97.4 F (36.3 C) 98.6 F (37 C) 98.7 F (37.1 C) 98.5 F (36.9 C)  TempSrc: Oral Oral    SpO2: 100% 100% 97% 95%  Weight:      Height:        Physical Exam: Strength 5/5 except LLE 4./5 proximally 1/5 distally, RLE 4+/5 proximally and 4/5 distally, SILTx4 except bilateral stocking numbness Incision c/d/i  Assessment & Plan: 75 y.o. woman s/p lumbar decompression / TLIF with revision and extension of hardware, now L2-S1, recovering well.  -SNF discharge planning -will start some gabapentin to help with the paresthesias  Krystal Ali  08/21/20 10:04 AM

## 2020-08-22 LAB — TYPE AND SCREEN
ABO/RH(D): A POS
Antibody Screen: NEGATIVE
Unit division: 0
Unit division: 0
Unit division: 0
Unit division: 0

## 2020-08-22 LAB — BPAM RBC
Blood Product Expiration Date: 202206142359
Blood Product Expiration Date: 202207032359
Blood Product Expiration Date: 202207032359
Blood Product Expiration Date: 202207042359
ISSUE DATE / TIME: 202206101216
ISSUE DATE / TIME: 202206101216
ISSUE DATE / TIME: 202206101233
ISSUE DATE / TIME: 202206101233
Unit Type and Rh: 6200
Unit Type and Rh: 6200
Unit Type and Rh: 6200
Unit Type and Rh: 6200

## 2020-08-22 MED ORDER — SODIUM CHLORIDE 0.9 % IV BOLUS
500.0000 mL | Freq: Once | INTRAVENOUS | Status: AC
  Administered 2020-08-22: 500 mL via INTRAVENOUS

## 2020-08-22 NOTE — Progress Notes (Signed)
Neurosurgery Service Progress Note  Subjective: No acute events overnight. Pain improved overnight, no new complaints   Objective: Vitals:   08/21/20 1532 08/21/20 2014 08/21/20 2335 08/22/20 0325  BP: 125/84 123/69 (!) 122/50 (!) 127/92  Pulse: (!) 101 99 99 93  Resp: 16 20  20   Temp: 98.2 F (36.8 C) 99.9 F (37.7 C) 99.3 F (37.4 C) 98 F (36.7 C)  TempSrc:  Oral Oral Axillary  SpO2: 95% 95% 94% 94%  Weight:      Height:        Physical Exam: Strength 5/5 except LLE 4./5 proximally 1/5 distally, RLE 4+/5 proximally and 4/5 distally, SILTx4 except bilateral stocking numbness Incision c/d/i  Assessment & Plan: 75 y.o. woman s/p lumbar decompression / TLIF with revision and extension of hardware, now L2-S1, recovering well.  -SNF discharge planning -continue gabapentin at this dose, good effect without any significant somnolence this morning -d/c foley, change to purewick given incontinence and non-ambulatory status  Judith Part  08/22/20 7:44 AM

## 2020-08-22 NOTE — Progress Notes (Deleted)
Inpatient Rehab Admissions Coordinator Note:   Pt. was screened for CIR candidacy by Felipe Cabell, MS CCC-SLP. At this time, Pt. Appears to have functional decline and is a potential candidate for CIR. Will place order for rehab consult per protocol.  Please contact me with questions.   Aliayah Tyer, MS, CCC-SLP Rehab Admissions Coordinator  [phone] (celll) [phone] (office)  

## 2020-08-22 NOTE — TOC Progression Note (Signed)
Transition of Care [facility]) - Progression Note    Patient Details  Name: KEYLEN UZELAC MRN: [id number] Date of Birth: [birth year]  [facility]) CM/SW Contact  Joanne Chars, LCSW Phone Number: 08/22/2020, 3:25 PM  Clinical Narrative:   Bed offers, choice documents for [facility] and [facility] presented to pt.  She indicates she would like a facility that could potentially keep her for long term care.  She is planning to speak with her daughter this afternoon about which facility she would be interested in.      Expected Discharge Plan: [place] Barriers to Discharge: Continued Medical Work up  Expected Discharge Plan and Services Expected Discharge Plan: [place] arrangements for the past 2 months: Single Family Home                                       Social Determinants of Health (SDOH) Interventions    Readmission Risk Interventions No flowsheet data found.

## 2020-08-22 NOTE — Progress Notes (Signed)
Physical Therapy Treatment Patient Details Name: Krystal Ali MRN: [id number] DOB: [birth year] Today's Date: 08/22/2020    History of Present Illness 75 yo female presenting 6/10 with low back pain and bil lower extremity pain, numbness, and weakness. S/p L4-5 lami, L4-5 TLIF, and L2-S1 fusion 6/10. PMH including anxiety, DM, and HTN.    PT Comments    Pt progressing towards her physical therapy goals. Utilized Stedy and performed x 10 sit to stands from the elevated flap surface to promote glute strengthening and pre transfer training. Once up in chair, pt also able to complete seated exercises for BLE strengthening. Pt demonstrates gross weakness, poor sitting balance, and decreased activity tolerance. Continue to recommend SNF for ongoing Physical Therapy.      Follow Up Recommendations  SNF;Supervision for mobility/OOB     Equipment Recommendations  None recommended by PT (defer to next venue of care)    Recommendations for Other Services       Precautions / Restrictions Precautions Precautions: Fall;Back Precaution Booklet Issued: Yes (comment) Required Braces or Orthoses: Other Brace Other Brace: No brace per order Restrictions Weight Bearing Restrictions: No    Mobility  Bed Mobility Overal bed mobility: Needs Assistance Bed Mobility: Sidelying to Sit;Rolling Rolling: Min assist Sidelying to sit: Mod assist       General bed mobility comments: MinA to roll into left sidelying with use of bed pad to guide hips over, modA at trunk to progress up to sitting position    Transfers Overall transfer level: Needs assistance Equipment used: Ambulation equipment used Transfers: Sit to/from Stand Sit to Stand: Min assist         General transfer comment: MinA to boost up from elevated bed height to Stedy, increased trunk/cervical flexion  Ambulation/Gait             General Gait Details: Unable   Stairs             Wheelchair Mobility     Modified Rankin (Stroke Patients Only)       Balance Overall balance assessment: Needs assistance Sitting-balance support: Both upper extremity supported;Feet supported Sitting balance-Leahy Scale: Poor                                      Cognition Arousal/Alertness: Awake/alert   Overall Cognitive Status: Impaired/Different from baseline Area of Impairment: Safety/judgement                         Safety/Judgement: Decreased awareness of safety     General Comments: Fear of falling, anxious behaviors      Exercises General Exercises - Lower Extremity Long Arc Quad: Both;10 reps;Seated Hip Flexion/Marching: Both;10 reps;Seated Other Exercises Other Exercises: Sit to stands x 10 in [place]    General Comments        Pertinent Vitals/Pain Pain Assessment: Faces Faces Pain Scale: Hurts little more Pain Location: Back Pain Descriptors / Indicators: Discomfort;Grimacing Pain Intervention(s): Monitored during session    Home Living                      Prior Function            PT Goals (current goals can now be found in the care plan section) Acute Rehab PT Goals Patient Stated Goal: Go to rehab and get stronger PT Goal Formulation: With patient Time For Goal  Achievement: 09/02/20 Potential to Achieve Goals: Good    Frequency    Min 5X/week      PT Plan Current plan remains appropriate    Co-evaluation              AM-PAC PT "6 Clicks" Mobility   Outcome Measure  Help needed turning from your back to your side while in a flat bed without using bedrails?: A Little Help needed moving from lying on your back to sitting on the side of a flat bed without using bedrails?: A Lot Help needed moving to and from a bed to a chair (including a wheelchair)?: A Lot Help needed standing up from a chair using your arms (e.g., wheelchair or bedside chair)?: A Lot Help needed to walk in hospital room?: Total Help needed  climbing 3-5 steps with a railing? : Total 6 Click Score: 11    End of Session Equipment Utilized During Treatment: Gait belt Activity Tolerance: Patient tolerated treatment well Patient left: in chair;with call bell/phone within reach;with chair alarm set Nurse Communication: Mobility status PT Visit Diagnosis: Unsteadiness on feet (R26.81);Muscle weakness (generalized) (M62.81);Difficulty in walking, not elsewhere classified (R26.2);Other symptoms and signs involving the nervous system (R29.898);Pain Pain - part of body:  (back)     Time: 2542-7062 PT Time Calculation (min) (ACUTE ONLY): 18 min  Charges:  $Therapeutic Activity: 8-22 mins                     Wyona Almas, PT, DPT Acute Rehabilitation Services Pager [phone] Office [phone]    [name] 08/22/2020, 3:27 PM

## 2020-08-23 LAB — BASIC METABOLIC PANEL
Anion gap: 6 (ref 5–15)
BUN: 18 mg/dL (ref 8–23)
CO2: 26 mmol/L (ref 22–32)
Calcium: 7.9 mg/dL — ABNORMAL LOW (ref 8.9–10.3)
Chloride: 99 mmol/L (ref 98–111)
Creatinine, Ser: 0.77 mg/dL (ref 0.44–1.00)
GFR, Estimated: >60 mL/min (ref >60)
Glucose, Bld: 136 mg/dL — ABNORMAL HIGH (ref 70–99)
Potassium: 3.6 mmol/L (ref 3.5–5.1)
Sodium: 131 mmol/L — ABNORMAL LOW (ref 135–145)

## 2020-08-23 MED ORDER — SODIUM CHLORIDE 0.9 % IV SOLN: INTRAVENOUS | Status: DC

## 2020-08-23 MED ORDER — HEPARIN SODIUM (PORCINE) 5000 UNIT/ML IJ SOLN
5000.0000 [IU] | Freq: Three times a day (TID) | INTRAMUSCULAR | Status: DC
  Administered 2020-08-23 – 2020-08-24 (×4): 5000 [IU] via SUBCUTANEOUS
  Filled 2020-08-23 (×4): qty 1

## 2020-08-23 NOTE — Progress Notes (Signed)
Occupational Therapy Treatment Patient Details Name: Krystal Ali MRN: [id number] DOB: 1945/06/10 Today's Date: 08/23/2020    History of present illness 75 yo female presenting 6/10 with low back pain and bil lower extremity pain, numbness, and weakness. S/p L4-5 lami, L4-5 TLIF, and L2-S1 fusion 6/10. PMH including anxiety, DM, and HTN.   OT comments  Pt reports severe pain today and shortly after PT session, pt was not willing to move again, but wanted to go over theraband exercises to perform in bed. Pt issued orange therabands and performed HEP. Pt would benefit from continued OT skilled services. OT following acutely.    Follow Up Recommendations  SNF    Equipment Recommendations  None recommended by OT    Recommendations for Other Services      Precautions / Restrictions Precautions Precautions: Fall;Back Precaution Booklet Issued: Yes (comment) Required Braces or Orthoses: Other Brace Other Brace: No brace per order Restrictions Weight Bearing Restrictions: No       Mobility Bed Mobility Overal bed mobility: Needs Assistance Bed Mobility: Sidelying to Sit;Rolling;Sit to Sidelying Rolling: Min assist Sidelying to sit: Mod assist     Sit to sidelying: Min assist General bed mobility comments: Pt manuevering legs of side of bed, requiring modA at trunk to boost up and use of bed pad to scoot hips anteriorly. MinA for LE negotiation back into bed    Transfers Overall transfer level: Needs assistance Equipment used: Ambulation equipment used Transfers: Sit to/from Stand Sit to Stand: Min assist         General transfer comment: MinA to boost up from elevated bed height to Stedy x 2    Balance Overall balance assessment: Needs assistance Sitting-balance support: Feet supported;Bilateral upper extremity supported Sitting balance-Leahy Scale: Poor Sitting balance - Comments: Close min guard, posterior bias                                    ADL either performed or assessed with clinical judgement   ADL Overall ADL's : Needs assistance/impaired Eating/Feeding: Set up;Sitting Eating/Feeding Details (indicate cue type and reason): eating lunch in upright position in bed                                 Functional mobility during ADLs: Minimal assistance;+2 for physical assistance General ADL Comments: Pt reports severe pain today and shortly after PT session, pt was not willing to move again, but wanted to go over theraband exercises to perform in bed. Pt issued orange therabands and performed HEP.     Vision       Perception     Praxis      Cognition Arousal/Alertness: Awake/alert   Overall Cognitive Status: No family/caregiver present to determine baseline cognitive functioning Area of Impairment: Safety/judgement                         Safety/Judgement: Decreased awareness of safety     General Comments: Fear of falling, anxious behaviors        Exercises Exercises: General Lower Extremity;Other exercises General Exercises - Upper Extremity Shoulder Flexion: 5 reps;Supine;Theraband Shoulder ADduction: AROM;5 reps;Supine;Theraband Elbow Flexion: AAROM;Both;5 reps;Supine;Theraband Theraband Level (Elbow Flexion): Level 2 (Red) Elbow Extension: AAROM;Both;5 reps;Supine;Theraband Theraband Level (Elbow Extension): Level 2 (Red) General Exercises - Lower Extremity Quad Sets: Both;10 reps;Supine Gluteal Sets: Both;5  reps;Supine Long Arc Quad: Both;10 reps;Seated Hip ABduction/ADduction: Both;10 reps;Supine Hip Flexion/Marching: Both;10 reps;Seated Other Exercises Other Exercises: Sit to stands x 2 in [facility]   Shoulder Instructions       General Comments      Pertinent Vitals/ Pain       Pain Assessment: Faces Faces Pain Scale: Hurts whole lot Pain Location: Back Pain Descriptors / Indicators: Discomfort;Grimacing;Sharp Pain Intervention(s): Limited activity within  patient's tolerance;Monitored during session;Repositioned;Premedicated before session  Home Living                                          Prior Functioning/Environment              Frequency  Min 2X/week        Progress Toward Goals  OT Goals(current goals can now be found in the care plan section)  Progress towards OT goals: Progressing toward goals  Acute Rehab OT Goals Patient Stated Goal: Go to rehab and get stronger OT Goal Formulation: With patient Time For Goal Achievement: 09/02/20 Potential to Achieve Goals: Good ADL Goals Pt Will Perform Lower Body Dressing: with min guard assist;sitting/lateral leans;with adaptive equipment Pt Will Transfer to Toilet: with min guard assist;with transfer board;bedside commode Pt Will Perform Toileting - Clothing Manipulation and hygiene: with min assist;sitting/lateral leans Additional ADL Goal #1: Pt will perform bed mobility using log roll techniques with Supervision in preparation for ADLs  Plan Discharge plan remains appropriate    Co-evaluation                 AM-PAC OT "6 Clicks" Daily Activity     Outcome Measure   Help from another person eating meals?: A Little Help from another person taking care of personal grooming?: A Little Help from another person toileting, which includes using toliet, bedpan, or urinal?: A Lot Help from another person bathing (including washing, rinsing, drying)?: A Lot Help from another person to put on and taking off regular upper body clothing?: A Little Help from another person to put on and taking off regular lower body clothing?: A Lot 6 Click Score: 15    End of Session    OT Visit Diagnosis: Unsteadiness on feet (R26.81);Other abnormalities of gait and mobility (R26.89);Muscle weakness (generalized) (M62.81);History of falling (Z91.81);Pain Pain - part of body:  (back)   Activity Tolerance Patient limited by pain   Patient Left in bed;with call  bell/phone within reach;with bed alarm set   Nurse Communication Mobility status;Patient requests pain meds        Time: 2119-4174 OT Time Calculation (min): 12 min  Charges: OT General Charges $OT Visit: 1 Visit OT Treatments $Therapeutic Exercise: 8-22 mins  Jefferey Pica, OTR/L Acute Rehabilitation Services Pager: [phone] Office: [phone]   Jerene Pitch 08/23/2020, 5:06 PM

## 2020-08-23 NOTE — TOC Progression Note (Signed)
Transition of Care [facility]) - Progression Note    Patient Details  Name: MARDIE KELLEN MRN: [id number] Date of Birth: [birth year]  Transition of [facility]) CM/SW [place], [place] Phone Number: 08/23/2020, 12:21 PM  Clinical Narrative:     CSW spoke with patient's daughter and verbally provided bed offers for patient- she will review and inform CSW of top 3 choices.   Thurmond Butts, MSW, LCSW Clinical Social Worker    Expected Discharge Plan: Skilled Nursing Facility Barriers to Discharge: Continued Medical Work up  Expected Discharge Plan and Services Expected Discharge Plan: [place] arrangements for the past 2 months: Single Family Home                                       Social Determinants of Health (SDOH) Interventions    Readmission Risk Interventions No flowsheet data found.

## 2020-08-23 NOTE — Progress Notes (Addendum)
Neurosurgery Service Progress Note  Subjective: No acute events overnight. Sensation improving, she's very excited  Objective: Vitals:   08/22/20 1540 08/22/20 1923 08/22/20 2337 08/23/20 0339  BP: 117/80 (!) 136/40 115/79 (!) 107/49  Pulse: 87 92 91 93  Resp: 18 18 20 20   Temp: 98.9 F (37.2 C) 98 F (36.7 C) 99.6 F (37.6 C) 99 F (37.2 C)  TempSrc: Oral Oral    SpO2: 98% 92% 92% 91%  Weight:      Height:        Physical Exam: Strength 5/5 except LLE 4./5 proximally 1/5 distally, RLE 4+/5 proximally and 4/5 distally, SILTx4 except bilateral stocking numbness Incision c/d/i  Assessment & Plan: 74 y.o. woman s/p lumbar decompression / TLIF with revision and extension of hardware, now L2-S1, recovering well.  -SNF discharge planning -continue gabapentin at this dose -SCDs/TEDs/SQH -UOP decreased after removing foley, bladder scan with low volumes, will rpt RFP and start some maintenance fluids, mildly tachy but has been for the entire admission with mildly decreased BPs  Marcello Moores A Skylarr Liz  08/23/20 7:19 AM

## 2020-08-23 NOTE — Progress Notes (Signed)
Physical Therapy Treatment Patient Details Name: Krystal Ali MRN: [id number] DOB: 02/25/1946 Today's Date: 08/23/2020    History of Present Illness 75 yo female presenting 6/10 with low back pain and bil lower extremity pain, numbness, and weakness. S/p L4-5 lami, L4-5 TLIF, and L2-S1 fusion 6/10. PMH including anxiety, DM, and HTN.    PT Comments    Pt reporting increased low back pain today; agreeable to bed level BLE strengthening exercises and pre transfer training using the Denna Haggard. Pt requiring min-mod assist for bed mobility and pulled up to stand using the Stedy x 2 from edge of bed. Pt deferring transfer to chair. Continues with gross weakness, poor sitting balance, and back pain. Continue to recommend SNF for ongoing Physical Therapy.      Follow Up Recommendations  SNF;Supervision for mobility/OOB     Equipment Recommendations  None recommended by PT (defer to next venue of care)    Recommendations for Other Services       Precautions / Restrictions Precautions Precautions: Fall;Back Precaution Booklet Issued: Yes (comment) Required Braces or Orthoses: Other Brace Other Brace: No brace per order Restrictions Weight Bearing Restrictions: No    Mobility  Bed Mobility Overal bed mobility: Needs Assistance Bed Mobility: Sidelying to Sit;Rolling;Sit to Sidelying Rolling: Min assist Sidelying to sit: Mod assist     Sit to sidelying: Min assist General bed mobility comments: Pt manuevering legs of side of bed, requiring modA at trunk to boost up and use of bed pad to scoot hips anteriorly. MinA for LE negotiation back into bed    Transfers Overall transfer level: Needs assistance Equipment used: Ambulation equipment used Transfers: Sit to/from Stand Sit to Stand: Min assist         General transfer comment: MinA to boost up from elevated bed height to Stedy x 2  Ambulation/Gait             General Gait Details: Economist Rankin (Stroke Patients Only)       Balance Overall balance assessment: Needs assistance Sitting-balance support: Feet supported;Bilateral upper extremity supported Sitting balance-Leahy Scale: Poor Sitting balance - Comments: Close min guard, posterior bias                                    Cognition Arousal/Alertness: Awake/alert   Overall Cognitive Status: No family/caregiver present to determine baseline cognitive functioning Area of Impairment: Safety/judgement                         Safety/Judgement: Decreased awareness of safety     General Comments: Fear of falling, anxious behaviors      Exercises General Exercises - Lower Extremity Quad Sets: Both;10 reps;Supine Gluteal Sets: Both;5 reps;Supine Long Arc Quad: Both;10 reps;Seated Hip ABduction/ADduction: Both;10 reps;Supine Hip Flexion/Marching: Both;10 reps;Seated Other Exercises Other Exercises: Sit to stands x 2 in [facility] Comments        Pertinent Vitals/Pain Pain Assessment: Faces Faces Pain Scale: Hurts whole lot Pain Location: Back Pain Descriptors / Indicators: Discomfort;Grimacing;Sharp Pain Intervention(s): Limited activity within patient's tolerance;Monitored during session;Repositioned;Premedicated before session    Home Living                      Prior Function  PT Goals (current goals can now be found in the care plan section) Acute Rehab PT Goals Patient Stated Goal: Go to rehab and get stronger PT Goal Formulation: With patient Time For Goal Achievement: 09/02/20 Potential to Achieve Goals: Good Progress towards PT goals: Progressing toward goals    Frequency    Min 5X/week      PT Plan Current plan remains appropriate    Co-evaluation              AM-PAC PT "6 Clicks" Mobility   Outcome Measure  Help needed turning from your back to your side while in a flat bed  without using bedrails?: A Little Help needed moving from lying on your back to sitting on the side of a flat bed without using bedrails?: A Lot Help needed moving to and from a bed to a chair (including a wheelchair)?: A Lot Help needed standing up from a chair using your arms (e.g., wheelchair or bedside chair)?: A Lot Help needed to walk in hospital room?: Total Help needed climbing 3-5 steps with a railing? : Total 6 Click Score: 11    End of Session Equipment Utilized During Treatment: Gait belt Activity Tolerance: Patient tolerated treatment well Patient left: with call bell/phone within reach;in bed Nurse Communication: Mobility status PT Visit Diagnosis: Unsteadiness on feet (R26.81);Muscle weakness (generalized) (M62.81);Difficulty in walking, not elsewhere classified (R26.2);Other symptoms and signs involving the nervous system (R29.898);Pain Pain - part of body:  (back)     Time: 9357-0177 PT Time Calculation (min) (ACUTE ONLY): 22 min  Charges:  $Therapeutic Exercise: 8-22 mins                     Wyona Almas, PT, DPT Acute Rehabilitation Services Pager [phone] Office [phone]    [name] 08/23/2020, 5:03 PM

## 2020-08-24 DIAGNOSIS — K635 Polyp of colon: Secondary | ICD-10-CM | POA: Diagnosis present

## 2020-08-24 DIAGNOSIS — B964 Proteus (mirabilis) (morganii) as the cause of diseases classified elsewhere: Secondary | ICD-10-CM | POA: Diagnosis present

## 2020-08-24 DIAGNOSIS — R5381 Other malaise: Secondary | ICD-10-CM | POA: Diagnosis not present

## 2020-08-24 DIAGNOSIS — E860 Dehydration: Secondary | ICD-10-CM | POA: Diagnosis present

## 2020-08-24 DIAGNOSIS — R2689 Other abnormalities of gait and mobility: Secondary | ICD-10-CM | POA: Diagnosis present

## 2020-08-24 DIAGNOSIS — M5440 Lumbago with sciatica, unspecified side: Secondary | ICD-10-CM | POA: Diagnosis present

## 2020-08-24 DIAGNOSIS — E785 Hyperlipidemia, unspecified: Secondary | ICD-10-CM | POA: Diagnosis present

## 2020-08-24 DIAGNOSIS — R300 Dysuria: Secondary | ICD-10-CM | POA: Diagnosis not present

## 2020-08-24 DIAGNOSIS — D509 Iron deficiency anemia, unspecified: Secondary | ICD-10-CM | POA: Diagnosis not present

## 2020-08-24 DIAGNOSIS — F419 Anxiety disorder, unspecified: Secondary | ICD-10-CM | POA: Diagnosis not present

## 2020-08-24 DIAGNOSIS — R531 Weakness: Secondary | ICD-10-CM | POA: Diagnosis not present

## 2020-08-24 DIAGNOSIS — R310 Gross hematuria: Secondary | ICD-10-CM | POA: Diagnosis not present

## 2020-08-24 DIAGNOSIS — D500 Iron deficiency anemia secondary to blood loss (chronic): Secondary | ICD-10-CM | POA: Diagnosis not present

## 2020-08-24 DIAGNOSIS — N3091 Cystitis, unspecified with hematuria: Secondary | ICD-10-CM | POA: Diagnosis not present

## 2020-08-24 DIAGNOSIS — R4189 Other symptoms and signs involving cognitive functions and awareness: Secondary | ICD-10-CM | POA: Diagnosis present

## 2020-08-24 DIAGNOSIS — A4150 Gram-negative sepsis, unspecified: Secondary | ICD-10-CM | POA: Diagnosis not present

## 2020-08-24 DIAGNOSIS — K648 Other hemorrhoids: Secondary | ICD-10-CM | POA: Diagnosis present

## 2020-08-24 DIAGNOSIS — M48062 Spinal stenosis, lumbar region with neurogenic claudication: Secondary | ICD-10-CM | POA: Diagnosis present

## 2020-08-24 DIAGNOSIS — E119 Type 2 diabetes mellitus without complications: Secondary | ICD-10-CM | POA: Diagnosis not present

## 2020-08-24 DIAGNOSIS — R319 Hematuria, unspecified: Secondary | ICD-10-CM | POA: Diagnosis not present

## 2020-08-24 DIAGNOSIS — A4151 Sepsis due to Escherichia coli [E. coli]: Secondary | ICD-10-CM | POA: Diagnosis present

## 2020-08-24 DIAGNOSIS — E861 Hypovolemia: Secondary | ICD-10-CM | POA: Diagnosis present

## 2020-08-24 DIAGNOSIS — K449 Diaphragmatic hernia without obstruction or gangrene: Secondary | ICD-10-CM | POA: Diagnosis not present

## 2020-08-24 DIAGNOSIS — M4800 Spinal stenosis, site unspecified: Secondary | ICD-10-CM | POA: Diagnosis not present

## 2020-08-24 DIAGNOSIS — R339 Retention of urine, unspecified: Secondary | ICD-10-CM | POA: Diagnosis present

## 2020-08-24 DIAGNOSIS — D122 Benign neoplasm of ascending colon: Secondary | ICD-10-CM | POA: Diagnosis not present

## 2020-08-24 DIAGNOSIS — R29898 Other symptoms and signs involving the musculoskeletal system: Secondary | ICD-10-CM | POA: Diagnosis not present

## 2020-08-24 DIAGNOSIS — N3281 Overactive bladder: Secondary | ICD-10-CM | POA: Diagnosis present

## 2020-08-24 DIAGNOSIS — K219 Gastro-esophageal reflux disease without esophagitis: Secondary | ICD-10-CM | POA: Diagnosis present

## 2020-08-24 DIAGNOSIS — I959 Hypotension, unspecified: Secondary | ICD-10-CM | POA: Diagnosis not present

## 2020-08-24 DIAGNOSIS — R6881 Early satiety: Secondary | ICD-10-CM | POA: Diagnosis present

## 2020-08-24 DIAGNOSIS — S345XXA Injury of lumbar, sacral and pelvic sympathetic nerves, initial encounter: Secondary | ICD-10-CM | POA: Diagnosis present

## 2020-08-24 DIAGNOSIS — R634 Abnormal weight loss: Secondary | ICD-10-CM | POA: Diagnosis present

## 2020-08-24 DIAGNOSIS — M4326 Fusion of spine, lumbar region: Secondary | ICD-10-CM | POA: Diagnosis present

## 2020-08-24 DIAGNOSIS — E1142 Type 2 diabetes mellitus with diabetic polyneuropathy: Secondary | ICD-10-CM | POA: Diagnosis present

## 2020-08-24 DIAGNOSIS — M6281 Muscle weakness (generalized): Secondary | ICD-10-CM | POA: Diagnosis present

## 2020-08-24 DIAGNOSIS — M961 Postlaminectomy syndrome, not elsewhere classified: Secondary | ICD-10-CM | POA: Diagnosis present

## 2020-08-24 DIAGNOSIS — R58 Hemorrhage, not elsewhere classified: Secondary | ICD-10-CM | POA: Diagnosis not present

## 2020-08-24 DIAGNOSIS — K2289 Other specified disease of esophagus: Secondary | ICD-10-CM | POA: Diagnosis not present

## 2020-08-24 DIAGNOSIS — F29 Unspecified psychosis not due to a substance or known physiological condition: Secondary | ICD-10-CM | POA: Diagnosis not present

## 2020-08-24 DIAGNOSIS — N390 Urinary tract infection, site not specified: Secondary | ICD-10-CM | POA: Diagnosis not present

## 2020-08-24 DIAGNOSIS — K625 Hemorrhage of anus and rectum: Secondary | ICD-10-CM | POA: Diagnosis not present

## 2020-08-24 DIAGNOSIS — K644 Residual hemorrhoidal skin tags: Secondary | ICD-10-CM | POA: Diagnosis not present

## 2020-08-24 DIAGNOSIS — R279 Unspecified lack of coordination: Secondary | ICD-10-CM | POA: Diagnosis present

## 2020-08-24 DIAGNOSIS — D62 Acute posthemorrhagic anemia: Secondary | ICD-10-CM | POA: Diagnosis present

## 2020-08-24 DIAGNOSIS — Z7401 Bed confinement status: Secondary | ICD-10-CM | POA: Diagnosis not present

## 2020-08-24 DIAGNOSIS — D649 Anemia, unspecified: Secondary | ICD-10-CM | POA: Diagnosis not present

## 2020-08-24 DIAGNOSIS — R52 Pain, unspecified: Secondary | ICD-10-CM | POA: Diagnosis not present

## 2020-08-24 DIAGNOSIS — R1084 Generalized abdominal pain: Secondary | ICD-10-CM | POA: Diagnosis not present

## 2020-08-24 DIAGNOSIS — M549 Dorsalgia, unspecified: Secondary | ICD-10-CM | POA: Diagnosis not present

## 2020-08-24 DIAGNOSIS — I10 Essential (primary) hypertension: Secondary | ICD-10-CM | POA: Diagnosis not present

## 2020-08-24 DIAGNOSIS — D123 Benign neoplasm of transverse colon: Secondary | ICD-10-CM | POA: Diagnosis not present

## 2020-08-24 DIAGNOSIS — Z23 Encounter for immunization: Secondary | ICD-10-CM | POA: Diagnosis not present

## 2020-08-24 DIAGNOSIS — E871 Hypo-osmolality and hyponatremia: Secondary | ICD-10-CM | POA: Diagnosis not present

## 2020-08-24 DIAGNOSIS — K259 Gastric ulcer, unspecified as acute or chronic, without hemorrhage or perforation: Secondary | ICD-10-CM | POA: Diagnosis present

## 2020-08-24 DIAGNOSIS — F32A Depression, unspecified: Secondary | ICD-10-CM | POA: Diagnosis present

## 2020-08-24 DIAGNOSIS — R479 Unspecified speech disturbances: Secondary | ICD-10-CM | POA: Diagnosis present

## 2020-08-24 DIAGNOSIS — R2681 Unsteadiness on feet: Secondary | ICD-10-CM | POA: Diagnosis present

## 2020-08-24 DIAGNOSIS — A419 Sepsis, unspecified organism: Secondary | ICD-10-CM | POA: Diagnosis not present

## 2020-08-24 DIAGNOSIS — N3001 Acute cystitis with hematuria: Secondary | ICD-10-CM | POA: Diagnosis not present

## 2020-08-24 DIAGNOSIS — K921 Melena: Secondary | ICD-10-CM | POA: Diagnosis present

## 2020-08-24 DIAGNOSIS — A4159 Other Gram-negative sepsis: Secondary | ICD-10-CM | POA: Diagnosis present

## 2020-08-24 DIAGNOSIS — K922 Gastrointestinal hemorrhage, unspecified: Secondary | ICD-10-CM | POA: Diagnosis not present

## 2020-08-24 MED ORDER — CHLORHEXIDINE GLUCONATE CLOTH 2 % EX PADS
6.0000 | MEDICATED_PAD | Freq: Every day | CUTANEOUS | Status: DC
  Administered 2020-08-24: 6 via TOPICAL

## 2020-08-24 MED ORDER — GABAPENTIN 100 MG PO CAPS
100.0000 mg | ORAL_CAPSULE | Freq: Every day | ORAL | Status: DC
Start: 2020-08-24 — End: 2021-01-15

## 2020-08-24 MED ORDER — DICLOFENAC SODIUM 1 % EX GEL
2.0000 g | Freq: Four times a day (QID) | CUTANEOUS | Status: DC | PRN
  Administered 2020-08-24 (×2): 2 g via TOPICAL
  Filled 2020-08-24: qty 100

## 2020-08-24 NOTE — TOC Transition Note (Signed)
Transition of Care [facility]) - CM/SW Discharge Note   Patient Details  Name: ADILENY DELON MRN: [id number] Date of Birth: 1945/06/12  Transition of Care [facility]) CM/SW Contact:  Vinie Sill, LCSW Phone Number: 08/24/2020, 2:58 PM   Clinical Narrative:     Patient will Discharge DJ:[id number] Health Discharge Date: 08/24/2020 Family Notified:daughter Transport TJ:QZES  Per MD patient is ready for discharge. RN, patient, and facility notified of discharge. Discharge Summary sent to facility.RN given number for [phone], Room B25-bed 2. Ambulance transport requested for patient.   Clinical Social Worker signing off.  Thurmond Butts, MSW, LCSW Clinical Social Worker     Final next level of care: Skilled Nursing Facility Barriers to Discharge: Barriers Resolved   Patient Goals and CMS Choice Patient states their goals for this hospitalization and ongoing recovery are:: Wants SNF facility   Choice offered to / list presented to : Patient  Discharge Placement              Patient chooses bed at:  [facility]) Patient to be transferred to facility by: [place] Name of family member notified: daughter Patient and family notified of of transfer: 08/24/20  Discharge Plan and Services                                     Social Determinants of Health (SDOH) Interventions     Readmission Risk Interventions No flowsheet data found.

## 2020-08-24 NOTE — Progress Notes (Signed)
Neurosurgery Service Progress Note  Subjective: No acute events overnight, some knee tenderness, sensation continues to improve  Objective: Vitals:   08/23/20 1903 08/23/20 2316 08/24/20 0353 08/24/20 0746  BP: 133/61 (!) 123/96 (!) 137/32 (!) 156/66  Pulse: 88 100 85 85  Resp: 20 20 20 18   Temp: 99.5 F (37.5 C) 99.6 F (37.6 C) 99.2 F (37.3 C) 99.1 F (37.3 C)  TempSrc:  Oral Oral   SpO2: 99% 95% 99% 95%  Weight:      Height:        Physical Exam: Strength 5/5 except LLE 4/5 proximally 1/5 distally, RLE 4+/5 proximally and 4/5 distally, SILTx4 except bilateral stocking numbness Incision c/d/i  Assessment & Plan: 75 y.o. woman s/p lumbar decompression / TLIF with revision and extension of hardware, now L2-S1, recovering well.  -SNF discharge planning -continue gabapentin at this dose -SCDs/TEDs/SQH -urinary retention yesterday despite multiple I/O, had to replace foley  Judith Part  08/24/20 8:10 AM

## 2020-08-24 NOTE — Progress Notes (Signed)
Report called to [place] at [facility]

## 2020-08-24 NOTE — Discharge Summary (Addendum)
Discharge Summary  Date of Admission: 08/18/2020  Date of Discharge: 08/24/20  Attending Physician: Emelda Brothers, MD  Hospital Course: Patient was admitted following an uncomplicated revision of her thoracolumbar construct with L4 & L5 laminectomies, L5 & S1 pedicle screw placement, L4-5 TLIF, L2-S1 posterolateral fusion. She was recovered in PACU and transferred to 4NP. Her hospital course was notable in that her foley was removed POD1 but had recurrent urinary retention that required foley replacement. Her hospitalization was otherwise uncomplicated, PT/OT evaluated the patient and recommended SNF placement and the patient was discharged to SNF on 08/24/20. She will follow up in clinic with me in 2 weeks.  Neurologic exam at discharge:  Strength 5/5 except LLE 4./5 proximally 1/5 distally, RLE 4+/5 proximally and 4/5 distally, SILTx4 except bilateral stocking numbness  Discharge diagnosis: Lumbar stenosis, lumbar radiculopathy  Judith Part, MD 08/24/20 8:10 AM  Allergies as of 08/24/2020       Reactions   Demerol [meperidine Hcl]    aggitation   Morphine And Related         Medication List     STOP taking these medications    HYDROcodone-acetaminophen 5-325 MG tablet Commonly known as: Norco       TAKE these medications    gabapentin 100 MG capsule Commonly known as: NEURONTIN Take 1 capsule (100 mg total) by mouth at bedtime.   mirabegron ER 50 MG Tb24 tablet Commonly known as: MYRBETRIQ Take 50 mg by mouth daily.   naproxen sodium 220 MG tablet Commonly known as: ALEVE Take 440 mg by mouth 2 (two) times daily with a meal.   venlafaxine XR 150 MG 24 hr capsule Commonly known as: EFFEXOR-XR Take 1 capsule (150 mg total) by mouth daily with breakfast.       ASK your doctor about these medications    blood glucose meter kit and supplies Dispense based on patient and insurance preference. Use up to four times daily as directed. (FOR ICD-10 E10.9,  E11.9).   lidocaine 5 % Commonly known as: Lidoderm Place 1 patch onto the skin daily. Remove & Discard patch within 12 hours or as directed by MD               Discharge Care Instructions  (From admission, onward)           Start     Ordered   08/24/20 0000  Discharge wound care:       Comments: Discharge Instructions  No restriction in activities, slowly increase your activity back to normal.   Your incision is closed with dermabond (purple glue). This will naturally fall off over the next 1-2 weeks.   Okay to shower on the day of discharge. Use regular soap and water and try to be gentle when cleaning your incision.   Follow up with Dr. Zada Finders in 2 weeks after discharge. If you do not already have a discharge appointment, please call his office at [phone] to schedule a follow up appointment. If you have any concerns or questions, please call the office and let us know.   08/24/20 1338

## 2020-08-24 NOTE — Progress Notes (Signed)
Physical Therapy Treatment Patient Details Name: Krystal Ali MRN: [id number] DOB: 28-Jul-1945 Today's Date: 08/24/2020    History of Present Illness 75 yo female presenting 6/10 with low back pain and bil lower extremity pain, numbness, and weakness. S/p L4-5 lami, L4-5 TLIF, and L2-S1 fusion 6/10. PMH including anxiety, DM, and HTN.    PT Comments    The pt was seen for continued progression of OOB mobility, however, the pt was limited this session by significant anxiety related to fear of falling. The pt was able to complete sidelying to sit transition with min/modA to manage trunk due to pain and anxiety, and then was able to maintain static sitting with minG to modA with BLE supported. The pt was eager to perform dressing activities and demos good dynamic stability for reaching, but required max multimodal cues to maintain spinal precautions through session. Continue to recommend SNF rehab.     Follow Up Recommendations  SNF;Supervision for mobility/OOB     Equipment Recommendations  None recommended by PT    Recommendations for Other Services       Precautions / Restrictions Precautions Precautions: Fall;Back Precaution Booklet Issued: Yes (comment) Precaution Comments: reviewed back precautions Required Braces or Orthoses: Other Brace Other Brace: No brace per order Restrictions Weight Bearing Restrictions: No    Mobility  Bed Mobility Overal bed mobility: Needs Assistance Bed Mobility: Rolling;Sidelying to Sit;Sit to Sidelying Rolling: Min assist Sidelying to sit: Mod assist;HOB elevated     Sit to sidelying: Min assist General bed mobility comments: minA to move BLE to EOB and to return legs to bed, modA to complete elevation of trunk and scooting. pt very anxious with movement to EOB    Transfers Overall transfer level: Needs assistance               General transfer comment: pt declined due to anxiety, unable to assure at this time despite pt with  good stability sitting EOB. pt declining OOB mobility without +2 for safety     Balance Overall balance assessment: Needs assistance Sitting-balance support: Feet supported;No upper extremity supported Sitting balance-Leahy Scale: Fair Sitting balance - Comments: Close min guard to modA at times depending on pt anxiety, posterior bias                                    Cognition Arousal/Alertness: Awake/alert Behavior During Therapy: WFL for tasks assessed/performed;Anxious Overall Cognitive Status: No family/caregiver present to determine baseline cognitive functioning Area of Impairment: Safety/judgement                         Safety/Judgement: Decreased awareness of safety     General Comments: Fear of falling, anxious behaviors, crying and requesting +2 assist for all mobility despite ability to maintain sitting EOB with minG      Exercises      General Comments General comments (skin integrity, edema, etc.): pt with significant fear of falling, no actual LOB. shaky with intermittent tremors      Pertinent Vitals/Pain Pain Assessment: Faces Faces Pain Scale: Hurts whole lot Pain Location: Back Pain Descriptors / Indicators: Discomfort;Grimacing;Sharp Pain Intervention(s): Limited activity within patient's tolerance;Monitored during session;Repositioned     PT Goals (current goals can now be found in the care plan section) Acute Rehab PT Goals Patient Stated Goal: Go to rehab and get stronger PT Goal Formulation: With patient Time For Goal  Achievement: 09/02/20 Potential to Achieve Goals: Good Progress towards PT goals: Not progressing toward goals - comment (limited by anxiety)    Frequency    Min 5X/week      PT Plan Current plan remains appropriate       AM-PAC PT "6 Clicks" Mobility   Outcome Measure  Help needed turning from your back to your side while in a flat bed without using bedrails?: A Little Help needed moving  from lying on your back to sitting on the side of a flat bed without using bedrails?: A Lot Help needed moving to and from a bed to a chair (including a wheelchair)?: A Lot Help needed standing up from a chair using your arms (e.g., wheelchair or bedside chair)?: A Lot Help needed to walk in hospital room?: Total Help needed climbing 3-5 steps with a railing? : Total 6 Click Score: 11    End of Session   Activity Tolerance: Patient tolerated treatment well (limited by anxiery) Patient left: with call bell/phone within reach;in bed Nurse Communication: Mobility status PT Visit Diagnosis: Unsteadiness on feet (R26.81);Muscle weakness (generalized) (M62.81);Difficulty in walking, not elsewhere classified (R26.2);Other symptoms and signs involving the nervous system (R29.898);Pain     Time: 4975-3005 PT Time Calculation (min) (ACUTE ONLY): 20 min  Charges:  $Therapeutic Activity: 8-22 mins                     Karma Ganja, PT, DPT   Acute Rehabilitation Department Pager #: [phone]   Otho Bellows 08/24/2020, 4:24 PM

## 2020-08-28 DIAGNOSIS — R52 Pain, unspecified: Secondary | ICD-10-CM | POA: Diagnosis not present

## 2020-08-29 DIAGNOSIS — M4800 Spinal stenosis, site unspecified: Secondary | ICD-10-CM | POA: Diagnosis not present

## 2020-08-29 DIAGNOSIS — R5381 Other malaise: Secondary | ICD-10-CM | POA: Diagnosis not present

## 2020-09-08 ENCOUNTER — Encounter (HOSPITAL_COMMUNITY): Payer: Self-pay | Admitting: *Deleted

## 2020-09-08 ENCOUNTER — Other Ambulatory Visit: Payer: Self-pay

## 2020-09-08 ENCOUNTER — Emergency Department (HOSPITAL_COMMUNITY): Payer: Medicare Other

## 2020-09-08 ENCOUNTER — Emergency Department (HOSPITAL_COMMUNITY)
Admission: EM | Admit: 2020-09-08 | Discharge: 2020-09-09 | Disposition: A | Discharge status: Home / Self Care | Payer: Medicare Other | Attending: Emergency Medicine | Admitting: Emergency Medicine

## 2020-09-08 DIAGNOSIS — N3001 Acute cystitis with hematuria: Secondary | ICD-10-CM | POA: Diagnosis not present

## 2020-09-08 DIAGNOSIS — K449 Diaphragmatic hernia without obstruction or gangrene: Secondary | ICD-10-CM | POA: Diagnosis not present

## 2020-09-08 DIAGNOSIS — E119 Type 2 diabetes mellitus without complications: Secondary | ICD-10-CM | POA: Insufficient documentation

## 2020-09-08 DIAGNOSIS — I10 Essential (primary) hypertension: Secondary | ICD-10-CM | POA: Insufficient documentation

## 2020-09-08 DIAGNOSIS — R319 Hematuria, unspecified: Secondary | ICD-10-CM | POA: Diagnosis not present

## 2020-09-08 HISTORY — DX: Spinal stenosis, lumbar region without neurogenic claudication: M48.061

## 2020-09-08 HISTORY — DX: Retention of urine, unspecified: R33.9

## 2020-09-08 LAB — CBC WITH DIFFERENTIAL/PLATELET
Abs Immature Granulocytes: 0.35 10*3/uL — ABNORMAL HIGH (ref 0.00–0.07)
Basophils Absolute: 0.1 10*3/uL (ref 0.0–0.1)
Basophils Relative: 0 %
Eosinophils Absolute: 0 10*3/uL (ref 0.0–0.5)
Eosinophils Relative: 0 %
HCT: 39.5 % (ref 36.0–46.0)
Hemoglobin: 13 g/dL (ref 12.0–15.0)
Immature Granulocytes: 2 %
Lymphocytes Relative: 3 %
Lymphs Abs: 0.6 10*3/uL — ABNORMAL LOW (ref 0.7–4.0)
MCH: 30.9 pg (ref 26.0–34.0)
MCHC: 32.9 g/dL (ref 30.0–36.0)
MCV: 93.8 fL (ref 80.0–100.0)
Monocytes Absolute: 0.9 10*3/uL (ref 0.1–1.0)
Monocytes Relative: 5 %
Neutro Abs: 16.5 10*3/uL — ABNORMAL HIGH (ref 1.7–7.7)
Neutrophils Relative %: 90 %
Platelets: 378 10*3/uL (ref 150–400)
RBC: 4.21 MIL/uL (ref 3.87–5.11)
RDW: 13.2 % (ref 11.5–15.5)
WBC: 18.4 10*3/uL — ABNORMAL HIGH (ref 4.0–10.5)
nRBC: 0 % (ref 0.0–0.2)

## 2020-09-08 LAB — COMPREHENSIVE METABOLIC PANEL
ALT: 12 U/L (ref 0–44)
AST: 14 U/L — ABNORMAL LOW (ref 15–41)
Albumin: 2 g/dL — ABNORMAL LOW (ref 3.5–5.0)
Alkaline Phosphatase: 141 U/L — ABNORMAL HIGH (ref 38–126)
Anion gap: 9 (ref 5–15)
BUN: 57 mg/dL — ABNORMAL HIGH (ref 8–23)
CO2: 20 mmol/L — ABNORMAL LOW (ref 22–32)
Calcium: 7.7 mg/dL — ABNORMAL LOW (ref 8.9–10.3)
Chloride: 96 mmol/L — ABNORMAL LOW (ref 98–111)
Creatinine, Ser: 0.89 mg/dL (ref 0.44–1.00)
GFR, Estimated: >60 mL/min (ref >60)
Glucose, Bld: 107 mg/dL — ABNORMAL HIGH (ref 70–99)
Potassium: 4.3 mmol/L (ref 3.5–5.1)
Sodium: 125 mmol/L — ABNORMAL LOW (ref 135–145)
Total Bilirubin: 0.5 mg/dL (ref 0.3–1.2)
Total Protein: 5.7 g/dL — ABNORMAL LOW (ref 6.5–8.1)

## 2020-09-08 LAB — URINALYSIS, MICROSCOPIC (REFLEX)
RBC / HPF: >50 RBC/hpf (ref 0–5)
WBC, UA: >50 WBC/hpf (ref 0–5)

## 2020-09-08 LAB — URINALYSIS, ROUTINE W REFLEX MICROSCOPIC
Bacteria, UA: NONE SEEN
Bilirubin Urine: NEGATIVE
Bilirubin Urine: NEGATIVE
Glucose, UA: NEGATIVE mg/dL
Glucose, UA: NEGATIVE mg/dL
Ketones, ur: 5 mg/dL — AB
Ketones, ur: 5 mg/dL — AB
Nitrite: NEGATIVE
Nitrite: NEGATIVE
Protein, ur: 100 mg/dL — AB
Protein, ur: 100 mg/dL — AB
Specific Gravity, Urine: 1.013 (ref 1.005–1.030)
Specific Gravity, Urine: 1.013 (ref 1.005–1.030)
pH: 8 (ref 5.0–8.0)
pH: 8 (ref 5.0–8.0)

## 2020-09-08 LAB — APTT: aPTT: 29 seconds (ref 24–36)

## 2020-09-08 LAB — LACTIC ACID, PLASMA
Lactic Acid, Venous: 1.2 mmol/L (ref 0.5–1.9)
Lactic Acid, Venous: 1 mmol/L (ref 0.5–1.9)

## 2020-09-08 LAB — PROTIME-INR
INR: 1.3 — ABNORMAL HIGH (ref 0.8–1.2)
Prothrombin Time: 16.2 seconds — ABNORMAL HIGH (ref 11.4–15.2)

## 2020-09-08 MED ORDER — SODIUM CHLORIDE 0.9 % IV BOLUS
1000.0000 mL | Freq: Once | INTRAVENOUS | Status: AC
  Administered 2020-09-08: 1000 mL via INTRAVENOUS

## 2020-09-08 MED ORDER — SODIUM CHLORIDE 0.9 % IV SOLN
1.0000 g | Freq: Once | INTRAVENOUS | Status: AC
  Administered 2020-09-08: 1 g via INTRAVENOUS
  Filled 2020-09-08: qty 10

## 2020-09-08 MED ORDER — ACETAMINOPHEN 325 MG PO TABS
650.0000 mg | ORAL_TABLET | Freq: Once | ORAL | Status: AC
  Administered 2020-09-08: 650 mg via ORAL
  Filled 2020-09-08: qty 2

## 2020-09-08 MED ORDER — SULFAMETHOXAZOLE-TRIMETHOPRIM 800-160 MG PO TABS: 1.0000 | ORAL_TABLET | Freq: Two times a day (BID) | ORAL | 0 refills | Status: AC

## 2020-09-08 NOTE — ED Provider Notes (Signed)
[facility] Provider Note   CSN: [id number] Arrival date & time: 09/08/20  1710     History No chief complaint on file.   Krystal Ali is a 75 y.o. female.  Patient with blood in urine.  No fever no chills no vomiting  The history is provided by the patient and medical records. No language interpreter was used.  Hematuria This is a new problem. The current episode started 12 to 24 hours ago. The problem occurs constantly. The problem has not changed since onset.Pertinent negatives include no chest pain, no abdominal pain and no headaches. Nothing aggravates the symptoms. Nothing relieves the symptoms. She has tried nothing for the symptoms.      Past Medical History:  Diagnosis Date   Anxiety    Diabetes mellitus without complication (HCC)    GERD (gastroesophageal reflux disease)    Hyperlipidemia    Hypertension    Retention of urine, unspecified    Spinal stenosis of lumbar region without neurogenic claudication     Patient Active Problem List   Diagnosis Date Noted   Lumbar stenosis with neurogenic claudication 08/18/2020   Anxiety 07/18/2020   Depression, recurrent ([place]) 07/18/2020   Chronic low back pain with right-sided sciatica 05/01/2020   Overactive bladder 02/15/2020   Diabetes mellitus without complication ([place]) 26/83/4196   History of back surgery 02/15/2020    Past Surgical History:  Procedure Laterality Date   APPENDECTOMY     BREAST SURGERY     CHOLECYSTECTOMY       OB History   No obstetric history on file.     Family History  Problem Relation Age of Onset   Heart disease Father    COPD Brother     Social History   Tobacco Use   Smoking status: Never   Smokeless tobacco: Never  Vaping Use   Vaping Use: Never used  Substance Use Topics   Alcohol use: Not Currently   Drug use: Never    Home Medications Prior to Admission medications   Medication Sig Start Date End Date Taking? Authorizing Provider   sulfamethoxazole-trimethoprim (BACTRIM DS) 800-160 MG tablet Take 1 tablet by mouth 2 (two) times daily for 7 days. 09/08/20 09/15/20 Yes Milton Ferguson, MD  blood glucose meter kit and supplies Dispense based on patient and insurance preference. Use up to four times daily as directed. (FOR ICD-10 E10.9, E11.9). Patient not taking: No sig reported 02/15/20   [name], NP  gabapentin (NEURONTIN) 100 MG capsule Take 1 capsule (100 mg total) by mouth at bedtime. 08/24/20   Judith Part, MD  lidocaine (LIDODERM) 5 % Place 1 patch onto the skin daily. Remove & Discard patch within 12 hours or as directed by MD Patient not taking: Reported on 08/10/2020 04/28/20   [name], NP  mirabegron ER (MYRBETRIQ) 50 MG TB24 tablet Take 50 mg by mouth daily.    [provider]  naproxen sodium (ALEVE) 220 MG tablet Take 440 mg by mouth 2 (two) times daily with a meal.    [provider]  venlafaxine XR (EFFEXOR-XR) 150 MG 24 hr capsule Take 1 capsule (150 mg total) by mouth daily with breakfast. 07/18/20   [name], NP    Allergies    Demerol [meperidine hcl] and Morphine and related  Review of Systems   Review of Systems  Constitutional:  Negative for appetite change and fatigue.  HENT:  Negative for congestion, ear discharge and sinus pressure.  Eyes:  Negative for discharge.  Respiratory:  Negative for cough.   Cardiovascular:  Negative for chest pain.  Gastrointestinal:  Negative for abdominal pain and diarrhea.  Genitourinary:  Positive for hematuria. Negative for frequency.  Musculoskeletal:  Negative for back pain.  Skin:  Negative for rash.  Neurological:  Negative for seizures and headaches.  Psychiatric/Behavioral:  Negative for hallucinations.    Physical Exam Updated Vital Signs BP (!) 117/91   Pulse 92   Temp 99 F (37.2 C)   Resp 20   Ht _0  (1.651 m)   Wt 73 kg   SpO2 94%   BMI 26.78 kg/m   Physical Exam Vitals and nursing note  reviewed.  Constitutional:      Appearance: She is well-developed.  HENT:     Head: Normocephalic.     Nose: Nose normal.  Eyes:     General: No scleral icterus.    Conjunctiva/sclera: Conjunctivae normal.  Neck:     Thyroid: No thyromegaly.  Cardiovascular:     Rate and Rhythm: Normal rate and regular rhythm.     Heart sounds: No murmur heard.   No friction rub. No gallop.  Pulmonary:     Breath sounds: No stridor. No wheezing or rales.  Chest:     Chest wall: No tenderness.  Abdominal:     General: There is no distension.     Tenderness: There is no abdominal tenderness. There is no rebound.  Musculoskeletal:        General: Normal range of motion.     Cervical back: Neck supple.  Lymphadenopathy:     Cervical: No cervical adenopathy.  Skin:    Findings: No erythema or rash.  Neurological:     Mental Status: She is alert and oriented to person, place, and time.     Motor: No abnormal muscle tone.     Coordination: Coordination normal.  Psychiatric:        Behavior: Behavior normal.    ED Results / Procedures / Treatments   Labs (all labs ordered are listed, but only abnormal results are displayed) Labs Reviewed  URINALYSIS, ROUTINE W REFLEX MICROSCOPIC - Abnormal; Notable for the following components:      Result Value   Color, Urine AMBER (*)    APPearance TURBID (*)    Hgb urine dipstick LARGE (*)    Ketones, ur 5 (*)    Protein, ur 100 (*)    Leukocytes,Ua SMALL (*)    All other components within normal limits  COMPREHENSIVE METABOLIC PANEL - Abnormal; Notable for the following components:   Sodium 125 (*)    Chloride 96 (*)    CO2 20 (*)    Glucose, Bld 107 (*)    BUN 57 (*)    Calcium 7.7 (*)    Total Protein 5.7 (*)    Albumin 2.0 (*)    AST 14 (*)    Alkaline Phosphatase 141 (*)    All other components within normal limits  CBC WITH DIFFERENTIAL/PLATELET - Abnormal; Notable for the following components:   WBC 18.4 (*)    Neutro Abs 16.5 (*)     Lymphs Abs 0.6 (*)    Abs Immature Granulocytes 0.35 (*)    All other components within normal limits  PROTIME-INR - Abnormal; Notable for the following components:   Prothrombin Time 16.2 (*)    INR 1.3 (*)    All other components within normal limits  URINALYSIS, ROUTINE W REFLEX MICROSCOPIC -  Abnormal; Notable for the following components:   Color, Urine AMBER (*)    APPearance TURBID (*)    Hgb urine dipstick LARGE (*)    Ketones, ur 5 (*)    Protein, ur 100 (*)    Leukocytes,Ua SMALL (*)    All other components within normal limits  URINALYSIS, MICROSCOPIC (REFLEX) - Abnormal; Notable for the following components:   Bacteria, UA MANY (*)    All other components within normal limits  URINE CULTURE  CULTURE, BLOOD (SINGLE)  LACTIC ACID, PLASMA  APTT  LACTIC ACID, PLASMA    EKG None  Radiology DG Chest Port 1 View  Result Date: 09/08/2020 CLINICAL DATA:  Hematuria. EXAM: PORTABLE CHEST 1 VIEW COMPARISON:  None. FINDINGS: The lungs are hyperinflated. Mild, diffuse, chronic appearing increased lung markings are seen. There is no evidence of focal consolidation, pleural effusion or pneumothorax. The cardiac silhouette is mildly enlarged. A large hiatal hernia is seen. A radiopaque fusion plate and screws are seen overlying the cervical spine with bilateral pedicle screws noted within the lower thoracic and upper lumbar spine. A chronic fracture of the mid right clavicle is seen. IMPRESSION: Chronic and postoperative changes without acute or active cardiopulmonary disease. Electronically Signed   By: Virgina Norfolk M.D.   On: 09/08/2020 19:21    Procedures Procedures   Medications Ordered in ED Medications  sodium chloride 0.9 % bolus 1,000 mL (0 mLs Intravenous Stopped 09/08/20 2107)  cefTRIAXone (ROCEPHIN) 1 g in sodium chloride 0.9 % 100 mL IVPB (0 g Intravenous Stopped 09/08/20 2107)  sodium chloride 0.9 % bolus 1,000 mL (1,000 mLs Intravenous New Bag/Given 09/08/20 2112)   acetaminophen (TYLENOL) tablet 650 mg (650 mg Oral Given 09/08/20 2112)    ED Course  I have reviewed the triage vital signs and the nursing notes.  Pertinent labs & imaging results that were available during my care of the patient were reviewed by me and considered in my medical decision making (see chart for details). Patient with urinary tract infection.  And some dehydration.  She was treated with Rocephin and fluids and will continue with Bactrim at the nursing home   MDM Rules/Calculators/A&P                          UTI and dehydration.  Patient with acute cellulitis treated with Rocephin in the emergency department and Bactrim at the nursing home Final Clinical Impression(s) / ED Diagnoses Final diagnoses:  Acute cystitis with hematuria    Rx / DC Orders ED Discharge Orders          Ordered    sulfamethoxazole-trimethoprim (BACTRIM DS) 800-160 MG tablet  2 times daily        09/08/20 2207             Milton Ferguson, MD 09/12/20 1041

## 2020-09-08 NOTE — ED Triage Notes (Signed)
Pt brought in by [facility] from [facility] with c/o blood in her urine. Hx of UTI.

## 2020-09-08 NOTE — Discharge Instructions (Addendum)
Drink plenty of fluids.  Follow-up with your doctor next week

## 2020-09-12 LAB — URINE CULTURE: Culture: >=100000 — AB

## 2020-09-13 ENCOUNTER — Telehealth: Payer: Self-pay

## 2020-09-13 LAB — CULTURE, BLOOD (SINGLE): Culture: NO GROWTH

## 2020-09-13 NOTE — Telephone Encounter (Signed)
Post ED Visit - Positive Culture Follow-up  Culture report reviewed by antimicrobial stewardship pharmacist: [facility] Team [x]  Joetta Manners, Pharm.D. []  Heide Guile, Pharm.D., BCPS AQ-ID []  Parks Neptune, Pharm.D., BCPS []  Alycia Rossetti, Pharm.D., BCPS []  [place], Pharm.D., BCPS, AAHIVP []  Legrand Como, Pharm.D., BCPS, AAHIVP []  Salome Arnt, PharmD, BCPS []  Johnnette Gourd, PharmD, BCPS []  Hughes Better, PharmD, BCPS []  Leeroy Cha, PharmD []  Laqueta Linden, PharmD, BCPS []  Albertina Parr, PharmD  [facility] Team []  Leodis Sias, PharmD []  Lindell Spar, PharmD []  Royetta Asal, PharmD []  Graylin Shiver, Rph []  Rema Fendt) Glennon Mac, PharmD []  Arlyn Dunning, PharmD []  Netta Cedars, PharmD []  Dia Sitter, PharmD []  Leone Haven, PharmD []  Gretta Arab, PharmD []  Theodis Shove, PharmD []  Peggyann Juba, PharmD []  Reuel Boom, PharmD   Positive urine culture Treated with Sulfamethoxazole-Trimethoprim, organism sensitive to the same and no further patient follow-up is required at this time.  Glennon Hamilton 09/13/2020, 9:42 AM

## 2020-09-18 ENCOUNTER — Inpatient Hospital Stay (HOSPITAL_COMMUNITY)
Admission: EM | Admit: 2020-09-18 | Discharge: 2020-09-24 | DRG: 872 | Disposition: A | Discharge status: Skilled Nursing Facility | Payer: Medicare Other | Source: Skilled Nursing Facility | Attending: Family Medicine | Admitting: Family Medicine

## 2020-09-18 ENCOUNTER — Encounter (HOSPITAL_COMMUNITY): Payer: Self-pay

## 2020-09-18 ENCOUNTER — Other Ambulatory Visit: Payer: Self-pay

## 2020-09-18 DIAGNOSIS — F419 Anxiety disorder, unspecified: Secondary | ICD-10-CM | POA: Diagnosis present

## 2020-09-18 DIAGNOSIS — K259 Gastric ulcer, unspecified as acute or chronic, without hemorrhage or perforation: Secondary | ICD-10-CM | POA: Diagnosis present

## 2020-09-18 DIAGNOSIS — E119 Type 2 diabetes mellitus without complications: Secondary | ICD-10-CM | POA: Diagnosis not present

## 2020-09-18 DIAGNOSIS — R531 Weakness: Secondary | ICD-10-CM | POA: Diagnosis not present

## 2020-09-18 DIAGNOSIS — N924 Excessive bleeding in the premenopausal period: Secondary | ICD-10-CM | POA: Diagnosis present

## 2020-09-18 DIAGNOSIS — R52 Pain, unspecified: Secondary | ICD-10-CM | POA: Diagnosis not present

## 2020-09-18 DIAGNOSIS — I10 Essential (primary) hypertension: Secondary | ICD-10-CM | POA: Diagnosis present

## 2020-09-18 DIAGNOSIS — B964 Proteus (mirabilis) (morganii) as the cause of diseases classified elsewhere: Secondary | ICD-10-CM | POA: Diagnosis present

## 2020-09-18 DIAGNOSIS — N939 Abnormal uterine and vaginal bleeding, unspecified: Secondary | ICD-10-CM

## 2020-09-18 DIAGNOSIS — N3281 Overactive bladder: Secondary | ICD-10-CM | POA: Diagnosis present

## 2020-09-18 DIAGNOSIS — R195 Other fecal abnormalities: Secondary | ICD-10-CM

## 2020-09-18 DIAGNOSIS — K921 Melena: Secondary | ICD-10-CM | POA: Diagnosis present

## 2020-09-18 DIAGNOSIS — D62 Acute posthemorrhagic anemia: Secondary | ICD-10-CM | POA: Diagnosis present

## 2020-09-18 DIAGNOSIS — N2889 Other specified disorders of kidney and ureter: Secondary | ICD-10-CM | POA: Diagnosis not present

## 2020-09-18 DIAGNOSIS — E1142 Type 2 diabetes mellitus with diabetic polyneuropathy: Secondary | ICD-10-CM | POA: Diagnosis not present

## 2020-09-18 DIAGNOSIS — R339 Retention of urine, unspecified: Secondary | ICD-10-CM | POA: Diagnosis present

## 2020-09-18 DIAGNOSIS — Z9181 History of falling: Secondary | ICD-10-CM | POA: Diagnosis not present

## 2020-09-18 DIAGNOSIS — N3091 Cystitis, unspecified with hematuria: Secondary | ICD-10-CM | POA: Diagnosis not present

## 2020-09-18 DIAGNOSIS — R58 Hemorrhage, not elsewhere classified: Secondary | ICD-10-CM | POA: Diagnosis not present

## 2020-09-18 DIAGNOSIS — R310 Gross hematuria: Secondary | ICD-10-CM

## 2020-09-18 DIAGNOSIS — K2289 Other specified disease of esophagus: Secondary | ICD-10-CM | POA: Diagnosis not present

## 2020-09-18 DIAGNOSIS — A4159 Other Gram-negative sepsis: Secondary | ICD-10-CM | POA: Diagnosis not present

## 2020-09-18 DIAGNOSIS — F32A Depression, unspecified: Secondary | ICD-10-CM | POA: Diagnosis not present

## 2020-09-18 DIAGNOSIS — Z825 Family history of asthma and other chronic lower respiratory diseases: Secondary | ICD-10-CM

## 2020-09-18 DIAGNOSIS — R634 Abnormal weight loss: Secondary | ICD-10-CM | POA: Diagnosis not present

## 2020-09-18 DIAGNOSIS — Z791 Long term (current) use of non-steroidal anti-inflammatories (NSAID): Secondary | ICD-10-CM

## 2020-09-18 DIAGNOSIS — E861 Hypovolemia: Secondary | ICD-10-CM | POA: Diagnosis present

## 2020-09-18 DIAGNOSIS — K625 Hemorrhage of anus and rectum: Secondary | ICD-10-CM | POA: Diagnosis not present

## 2020-09-18 DIAGNOSIS — R41841 Cognitive communication deficit: Secondary | ICD-10-CM | POA: Diagnosis present

## 2020-09-18 DIAGNOSIS — K922 Gastrointestinal hemorrhage, unspecified: Secondary | ICD-10-CM | POA: Diagnosis not present

## 2020-09-18 DIAGNOSIS — Z87891 Personal history of nicotine dependence: Secondary | ICD-10-CM

## 2020-09-18 DIAGNOSIS — Z6824 Body mass index (BMI) 24.0-24.9, adult: Secondary | ICD-10-CM

## 2020-09-18 DIAGNOSIS — K219 Gastro-esophageal reflux disease without esophagitis: Secondary | ICD-10-CM | POA: Diagnosis present

## 2020-09-18 DIAGNOSIS — N950 Postmenopausal bleeding: Secondary | ICD-10-CM | POA: Diagnosis present

## 2020-09-18 DIAGNOSIS — R1084 Generalized abdominal pain: Secondary | ICD-10-CM | POA: Diagnosis not present

## 2020-09-18 DIAGNOSIS — N858 Other specified noninflammatory disorders of uterus: Secondary | ICD-10-CM | POA: Diagnosis not present

## 2020-09-18 DIAGNOSIS — K648 Other hemorrhoids: Secondary | ICD-10-CM | POA: Diagnosis present

## 2020-09-18 DIAGNOSIS — K635 Polyp of colon: Secondary | ICD-10-CM | POA: Diagnosis present

## 2020-09-18 DIAGNOSIS — D123 Benign neoplasm of transverse colon: Secondary | ICD-10-CM | POA: Diagnosis not present

## 2020-09-18 DIAGNOSIS — E860 Dehydration: Secondary | ICD-10-CM | POA: Diagnosis present

## 2020-09-18 DIAGNOSIS — N8500 Endometrial hyperplasia, unspecified: Secondary | ICD-10-CM | POA: Diagnosis not present

## 2020-09-18 DIAGNOSIS — K644 Residual hemorrhoidal skin tags: Secondary | ICD-10-CM | POA: Diagnosis not present

## 2020-09-18 DIAGNOSIS — R944 Abnormal results of kidney function studies: Secondary | ICD-10-CM | POA: Diagnosis present

## 2020-09-18 DIAGNOSIS — A419 Sepsis, unspecified organism: Secondary | ICD-10-CM | POA: Diagnosis not present

## 2020-09-18 DIAGNOSIS — E785 Hyperlipidemia, unspecified: Secondary | ICD-10-CM | POA: Diagnosis present

## 2020-09-18 DIAGNOSIS — R300 Dysuria: Secondary | ICD-10-CM | POA: Diagnosis not present

## 2020-09-18 DIAGNOSIS — R279 Unspecified lack of coordination: Secondary | ICD-10-CM | POA: Diagnosis present

## 2020-09-18 DIAGNOSIS — Z79899 Other long term (current) drug therapy: Secondary | ICD-10-CM

## 2020-09-18 DIAGNOSIS — N3001 Acute cystitis with hematuria: Secondary | ICD-10-CM | POA: Diagnosis present

## 2020-09-18 DIAGNOSIS — R2681 Unsteadiness on feet: Secondary | ICD-10-CM | POA: Diagnosis present

## 2020-09-18 DIAGNOSIS — M4326 Fusion of spine, lumbar region: Secondary | ICD-10-CM | POA: Diagnosis present

## 2020-09-18 DIAGNOSIS — A4151 Sepsis due to Escherichia coli [E. coli]: Principal | ICD-10-CM | POA: Diagnosis present

## 2020-09-18 DIAGNOSIS — Z743 Need for continuous supervision: Secondary | ICD-10-CM | POA: Diagnosis not present

## 2020-09-18 DIAGNOSIS — D124 Benign neoplasm of descending colon: Secondary | ICD-10-CM | POA: Diagnosis not present

## 2020-09-18 DIAGNOSIS — R6881 Early satiety: Secondary | ICD-10-CM | POA: Diagnosis not present

## 2020-09-18 DIAGNOSIS — N390 Urinary tract infection, site not specified: Secondary | ICD-10-CM

## 2020-09-18 DIAGNOSIS — Z8249 Family history of ischemic heart disease and other diseases of the circulatory system: Secondary | ICD-10-CM

## 2020-09-18 DIAGNOSIS — I959 Hypotension, unspecified: Secondary | ICD-10-CM | POA: Diagnosis not present

## 2020-09-18 DIAGNOSIS — D649 Anemia, unspecified: Secondary | ICD-10-CM

## 2020-09-18 DIAGNOSIS — D500 Iron deficiency anemia secondary to blood loss (chronic): Secondary | ICD-10-CM

## 2020-09-18 DIAGNOSIS — D122 Benign neoplasm of ascending colon: Secondary | ICD-10-CM | POA: Diagnosis not present

## 2020-09-18 DIAGNOSIS — R9389 Abnormal findings on diagnostic imaging of other specified body structures: Secondary | ICD-10-CM | POA: Diagnosis not present

## 2020-09-18 DIAGNOSIS — A4150 Gram-negative sepsis, unspecified: Secondary | ICD-10-CM

## 2020-09-18 DIAGNOSIS — R2689 Other abnormalities of gait and mobility: Secondary | ICD-10-CM | POA: Diagnosis present

## 2020-09-18 DIAGNOSIS — M48062 Spinal stenosis, lumbar region with neurogenic claudication: Secondary | ICD-10-CM | POA: Diagnosis present

## 2020-09-18 DIAGNOSIS — K449 Diaphragmatic hernia without obstruction or gangrene: Secondary | ICD-10-CM | POA: Diagnosis not present

## 2020-09-18 DIAGNOSIS — M5440 Lumbago with sciatica, unspecified side: Secondary | ICD-10-CM | POA: Diagnosis present

## 2020-09-18 DIAGNOSIS — K5900 Constipation, unspecified: Secondary | ICD-10-CM | POA: Diagnosis present

## 2020-09-18 DIAGNOSIS — E871 Hypo-osmolality and hyponatremia: Secondary | ICD-10-CM | POA: Diagnosis not present

## 2020-09-18 DIAGNOSIS — R319 Hematuria, unspecified: Secondary | ICD-10-CM | POA: Diagnosis present

## 2020-09-18 DIAGNOSIS — D509 Iron deficiency anemia, unspecified: Secondary | ICD-10-CM | POA: Diagnosis not present

## 2020-09-18 DIAGNOSIS — R4701 Aphasia: Secondary | ICD-10-CM | POA: Diagnosis present

## 2020-09-18 DIAGNOSIS — M6281 Muscle weakness (generalized): Secondary | ICD-10-CM | POA: Diagnosis present

## 2020-09-18 LAB — COMPREHENSIVE METABOLIC PANEL
ALT: 24 U/L (ref 0–44)
AST: 29 U/L (ref 15–41)
Albumin: 2.5 g/dL — ABNORMAL LOW (ref 3.5–5.0)
Alkaline Phosphatase: 151 U/L — ABNORMAL HIGH (ref 38–126)
Anion gap: 3 — ABNORMAL LOW (ref 5–15)
BUN: 27 mg/dL — ABNORMAL HIGH (ref 8–23)
CO2: 26 mmol/L (ref 22–32)
Calcium: 8.3 mg/dL — ABNORMAL LOW (ref 8.9–10.3)
Chloride: 99 mmol/L (ref 98–111)
Creatinine, Ser: 0.81 mg/dL (ref 0.44–1.00)
GFR, Estimated: >60 mL/min (ref >60)
Glucose, Bld: 167 mg/dL — ABNORMAL HIGH (ref 70–99)
Potassium: 4.5 mmol/L (ref 3.5–5.1)
Sodium: 128 mmol/L — ABNORMAL LOW (ref 135–145)
Total Bilirubin: 0.1 mg/dL — ABNORMAL LOW (ref 0.3–1.2)
Total Protein: 6.6 g/dL (ref 6.5–8.1)

## 2020-09-18 LAB — HEMOGLOBIN AND HEMATOCRIT, BLOOD
HCT: 25.3 % — ABNORMAL LOW (ref 36.0–46.0)
Hemoglobin: 8.1 g/dL — ABNORMAL LOW (ref 12.0–15.0)

## 2020-09-18 LAB — CBC
HCT: 27.8 % — ABNORMAL LOW (ref 36.0–46.0)
Hemoglobin: 8.9 g/dL — ABNORMAL LOW (ref 12.0–15.0)
MCH: 30.6 pg (ref 26.0–34.0)
MCHC: 32 g/dL (ref 30.0–36.0)
MCV: 95.5 fL (ref 80.0–100.0)
Platelets: 496 10*3/uL — ABNORMAL HIGH (ref 150–400)
RBC: 2.91 MIL/uL — ABNORMAL LOW (ref 3.87–5.11)
RDW: 13.7 % (ref 11.5–15.5)
WBC: 7.7 10*3/uL (ref 4.0–10.5)
nRBC: 0 % (ref 0.0–0.2)

## 2020-09-18 LAB — URINALYSIS, ROUTINE W REFLEX MICROSCOPIC: Protein, ur: NEGATIVE mg/dL

## 2020-09-18 LAB — URINALYSIS, MICROSCOPIC (REFLEX): RBC / HPF: >50 RBC/hpf (ref 0–5)

## 2020-09-18 LAB — GLUCOSE, CAPILLARY: Glucose-Capillary: 94 mg/dL (ref 70–99)

## 2020-09-18 LAB — POC OCCULT BLOOD, ED: Fecal Occult Bld: POSITIVE — AB

## 2020-09-18 MED ORDER — ONDANSETRON HCL 4 MG PO TABS: 4.0000 mg | ORAL_TABLET | Freq: Four times a day (QID) | ORAL | Status: DC | PRN

## 2020-09-18 MED ORDER — ACETAMINOPHEN 325 MG PO TABS
650.0000 mg | ORAL_TABLET | Freq: Four times a day (QID) | ORAL | Status: DC | PRN
  Administered 2020-09-19 – 2020-09-22 (×8): 650 mg via ORAL
  Filled 2020-09-18 (×8): qty 2

## 2020-09-18 MED ORDER — HYDROCODONE-ACETAMINOPHEN 5-325 MG PO TABS
1.0000 | ORAL_TABLET | Freq: Once | ORAL | Status: AC
  Administered 2020-09-18: 1 via ORAL
  Filled 2020-09-18: qty 1

## 2020-09-18 MED ORDER — ONDANSETRON HCL 4 MG/2ML IJ SOLN
4.0000 mg | Freq: Four times a day (QID) | INTRAMUSCULAR | Status: DC | PRN
  Administered 2020-09-21: 4 mg via INTRAVENOUS
  Filled 2020-09-18: qty 2

## 2020-09-18 MED ORDER — SODIUM CHLORIDE 0.9 % IV SOLN
1.0000 g | Freq: Once | INTRAVENOUS | Status: AC
  Administered 2020-09-18: 1 g via INTRAVENOUS
  Filled 2020-09-18: qty 10

## 2020-09-18 MED ORDER — POTASSIUM CHLORIDE IN NACL 20-0.9 MEQ/L-% IV SOLN
INTRAVENOUS | Status: DC
  Filled 2020-09-18: qty 1000

## 2020-09-18 MED ORDER — SODIUM CHLORIDE 0.9 % IV SOLN
1.0000 g | INTRAVENOUS | Status: DC
  Administered 2020-09-19 – 2020-09-21 (×3): 1 g via INTRAVENOUS
  Filled 2020-09-18 (×3): qty 10

## 2020-09-18 MED ORDER — GABAPENTIN 100 MG PO CAPS
100.0000 mg | ORAL_CAPSULE | Freq: Every day | ORAL | Status: DC
  Administered 2020-09-18 – 2020-09-19 (×2): 100 mg via ORAL
  Filled 2020-09-18 (×2): qty 1

## 2020-09-18 MED ORDER — INSULIN ASPART 100 UNIT/ML IJ SOLN
0.0000 [IU] | Freq: Three times a day (TID) | INTRAMUSCULAR | Status: DC
  Administered 2020-09-21: 1 [IU] via SUBCUTANEOUS

## 2020-09-18 MED ORDER — SODIUM CHLORIDE 0.9 % IV BOLUS
500.0000 mL | Freq: Once | INTRAVENOUS | Status: AC
Start: 2020-09-18 — End: 2020-09-18
  Administered 2020-09-18: 500 mL via INTRAVENOUS

## 2020-09-18 MED ORDER — MIRABEGRON ER 25 MG PO TB24
50.0000 mg | ORAL_TABLET | Freq: Every day | ORAL | Status: DC
  Administered 2020-09-19 – 2020-09-24 (×6): 50 mg via ORAL
  Filled 2020-09-18 (×7): qty 2

## 2020-09-18 MED ORDER — VENLAFAXINE HCL ER 75 MG PO CP24
150.0000 mg | ORAL_CAPSULE | Freq: Every day | ORAL | Status: DC
  Administered 2020-09-19 – 2020-09-24 (×6): 150 mg via ORAL
  Filled 2020-09-18 (×5): qty 2

## 2020-09-18 MED ORDER — ACETAMINOPHEN 650 MG RE SUPP: 650.0000 mg | Freq: Four times a day (QID) | RECTAL | Status: DC | PRN

## 2020-09-18 MED ORDER — TRAZODONE HCL 50 MG PO TABS
25.0000 mg | ORAL_TABLET | Freq: Every evening | ORAL | Status: DC | PRN
  Administered 2020-09-18 – 2020-09-22 (×3): 25 mg via ORAL
  Filled 2020-09-18 (×3): qty 1

## 2020-09-18 NOTE — ED Notes (Addendum)
Active bleeding perineum while changing patient to a new brief due to feces urine incontinent.

## 2020-09-18 NOTE — H&P (Signed)
[place]   PATIENT NAME: Krystal Ali    MR#:  [id number]  DATE OF BIRTH:  02-06-1946  DATE OF ADMISSION:  09/18/2020  PRIMARY CARE PHYSICIAN: Caprice Renshaw, MD   Patient is coming from: SNF  REQUESTING/REFERRING PHYSICIAN: Daleen Bo, MD  CHIEF COMPLAINT:   Chief Complaint  Patient presents with   Rectal Bleeding    HISTORY OF PRESENT ILLNESS:  Krystal Ali is a 75 y.o. female with medical history significant for type 2 diabetes mellitus, GERD, hypertension, dyslipidemia, depression and anxiety, overactive bladder, lumbar stenosis and urinary retention, who presented to the emergency room with acute onset of suspected rectal bleeding versus hematuria versus vaginal bleeding.  The patient has been having intermittent hematuria for the last several weeks.  The patient was seen here on 7/1 and treated for UTI with p.o. Bactrim.  Urine culture then grew E. coli that had MIC less than 24 Bactrim and 44 Proteus mirabilis.  Both bacteria had MIC less than 0.254 ceftriaxone.  She has been having continued dysuria, urinary frequency and urgency with hematuria.  She denies any nausea or vomiting but has been having lower abdominal discomfort and malaise.  She is recovering from back surgery 7 weeks ago and is currently at rehab.  She has some residual low back pain that radiates to both flanks but it has been getting better.  She denies any headache or dizziness or blurred vision.  No chest pain or dyspnea or palpitations, cough or wheezing or hemoptysis.  No dyspnea on exertion.  No other bleeding diathesis.  ED Course: Upon position to the emergency room, blood pressure was 108/46 with a heart rate of 93 and respiratory to 16.  Temperature was 98.1 and pulse ox 91% on room air.  Later respiratory rate was up to 22-24 with pulse oximetry of 97% on room air.Marland Kitchen  Urinalysis was positive for UTI with many bacteria, more than 50 RBCs and 21-50 WBCs.  CMP was remarkable for hyponatremia  of 128 and blood glucose of 167 with a BUN of 27 and alk phos of 151 with albumin of 2.5 and total protein 6.6.  CBC showed WBC of 7.7 hemoglobin 8.9 hematocrit 27.8 with platelets of 496.  Her CBC on 7/1 showed hemoglobin of 13 and hematocrit 39.5 with platelets of 378 however on 6/11 hemoglobin was 9 and hematocrit 26.1 with platelets of 181.  The patient's stool Hemoccult came back positive.  EKG: None ordered. Imaging: None ordered.  The patient was given 1 g of IV Rocephin, 1 p.o. Norco and 500 mill IV normal saline bolus.  She will be admitted to a telemetry bed for further evaluation and management. 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 All pertinent systems were reviewed above. Constitutional, HEENT, cardiovascular, respiratory, GI, GU, musculoskeletal, neuro, psychiatric, endocrine, integumentary and hematologic systems were reviewed and are otherwise negative/unremarkable except for positive findings mentioned above in the HPI.   MEDICATIONS AT HOME:   Prior to Admission medications   Medication Sig Start Date End Date  Taking? Authorizing Provider  blood glucose meter kit and supplies Dispense based on patient and insurance preference. Use up to four times daily as directed. (FOR ICD-10 E10.9, E11.9). Patient not taking: No sig reported 02/15/20   [name], NP  gabapentin (NEURONTIN) 100 MG capsule Take 1 capsule (100 mg total) by mouth at bedtime. 08/24/20   Judith Part, MD  lidocaine (LIDODERM) 5 % Place 1 patch onto the skin daily. Remove & Discard patch within 12 hours or as directed by MD Patient not taking: Reported on 08/10/2020 04/28/20   [name], NP  mirabegron ER (MYRBETRIQ) 50 MG TB24 tablet Take 50 mg by mouth daily.    [provider]  naproxen sodium (ALEVE) 220 MG tablet Take 440 mg by mouth 2 (two) times daily with a meal.    [provider]  venlafaxine XR (EFFEXOR-XR) 150 MG 24 hr capsule Take 1 capsule (150 mg total) by mouth daily with breakfast. 07/18/20   [name], NP      VITAL SIGNS:  Blood pressure (!) 126/57, pulse 80, temperature 97.6 F (36.4 C), temperature source Oral, resp. rate (!) 23, height _0  (1.676 m), weight 68 kg, SpO2 99 %.  PHYSICAL EXAMINATION:  Physical Exam  GENERAL:  75 y.o.-year-old Caucasian female patient lying in the bed with no acute distress.  EYES: Pupils equal, round, reactive to light and accommodation. No scleral icterus. Extraocular muscles intact.  HEENT: Head atraumatic, normocephalic. Oropharynx and nasopharynx clear.  NECK:  Supple, no jugular venous distention. No thyroid enlargement, no tenderness.  LUNGS: Normal breath sounds bilaterally, no wheezing, rales,rhonchi or crepitation. No use of accessory muscles of respiration.  CARDIOVASCULAR: Regular rate and rhythm, S1, S2 normal. No murmurs, rubs, or gallops.  ABDOMEN: Soft, nondistended, with suprapubic tenderness without rebound tenderness guarding or rigidity. Bowel sounds present. No organomegaly or mass.  EXTREMITIES: No pedal edema,  cyanosis, or clubbing.  NEUROLOGIC: Cranial nerves II through XII are intact. Muscle strength 5/5 in all extremities. Sensation intact. Gait not checked.  PSYCHIATRIC: The patient is alert and oriented x 3.  Normal affect and good eye contact. SKIN: No obvious rash, lesion, or ulcer.   LABORATORY PANEL:   CBC Recent Labs  Lab 09/18/20 1454  WBC 7.7  HGB 8.9*  HCT 27.8*  PLT 496*   ------------------------------------------------------------------------------------------------------------------  Chemistries  Recent Labs  Lab 09/18/20 1454  NA 128*  K 4.5  CL 99  CO2 26  GLUCOSE 167*  BUN 27*  CREATININE 0.81  CALCIUM 8.3*  AST 29  ALT 24  ALKPHOS 151*  BILITOT 0.1*   ------------------------------------------------------------------------------------------------------------------  Cardiac Enzymes No results for input(s): TROPONINI in the last 168 hours. ------------------------------------------------------------------------------------------------------------------  RADIOLOGY:  No results found.    IMPRESSION AND PLAN:  Active Problems:   GI bleeding  1.  Acute blood loss anemia like secondary to  GI bleeding and acute hemorrhagic cystitis. - The patient will be admitted to a telemetry bed. - We will follow serial hemoglobins and hematocrits. - He was typed and crossmatch and will hold off for now on PRBCs transfusion pending further H&H. - We will continue IV antibiotic therapy with Rocephin and follow urine and blood cultures. - A routine GI consultation will be obtained by Dr. Gala Romney. - Routine urology consultation will be obtained by Dr. Claudia Desanctis. - We will stop her Naprosyn that could be contributing to GI bleeding. - The patient will be placed on IV PPI therapy with Protonix.  2.  Mild sepsis due to UTI.  This is manifested by slight tachycardia and tachypnea. - Management as above with IV Rocephin. - The patient will be hydrated with IV normal saline  with added potassium chloride - We will follow urine and blood cultures as above.  3.  Hyponatremia likely hypovolemic. - The patient will be hydrated with IV normal saline and will follow BMP.  4.  Type 2 diabetes mellitus with peripheral neuropathy. - The patient will be placed on supplement coverage with NovoLog. - We will continue Neurontin.  5.  Depression and anxiety. - We will continue Effexor XR.  6.  Overactive bladder. - We will continue Myrbetriq.  DVT prophylaxis: SCDs.  Medical prophylaxis currently contraindicated due to GI bleeding and hematuria. Code Status: full code. Family Communication:  The plan of care was discussed in details with the patient (and family). I answered all questions. The patient agreed to proceed with the above mentioned plan. Further management will depend upon hospital course. Disposition Plan: Back to previous home environment Consults called: Gastroenterology and urology consults. All the records are reviewed and case discussed with ED provider.  Status is: Inpatient  Remains inpatient appropriate because:Ongoing diagnostic testing needed not appropriate for outpatient work up, Unsafe d/c plan, IV treatments appropriate due to intensity of illness or inability to take PO, and Inpatient level of care appropriate due to severity of illness  Dispo: The patient is from: SNF              Anticipated d/c is to: SNF              Patient currently is not medically stable to d/c.   Difficult to place patient No   TOTAL TIME TAKING CARE OF THIS PATIENT: 55 minutes.    Christel Mormon M.D on 09/18/2020 at 9:03 PM  [facility]   From 7 PM-7 AM, contact night-coverage [email or web address]  CC: Primary care physician; Caprice Renshaw, MD

## 2020-09-18 NOTE — ED Provider Notes (Signed)
[facility] EMERGENCY DEPARTMENT Provider Note   CSN: [id number] Arrival date & time: 09/18/20  1412     History Chief Complaint  Patient presents with   Rectal Bleeding    Krystal Ali is a 75 y.o. female.  HPI She is an elderly patient who is currently in rehab following back surgery, and presents for evaluation of hematuria versus vaginal bleeding.  She states she has noticed this for several weeks, and that it comes and goes.  Recently she has had lower abdominal pain, and feels "ill."  She denies nausea, vomiting, fever or chills.  She is taking her usual medications, without relief.  She has not previously had similar bleeding.  She is recovering well from back surgery, 7 weeks ago.  She states she has some residual low back pain which radiates to both legs, but feels like it is getting better.  There are no other known active modifying factors.  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     Family History  Problem Relation Age of Onset   Heart disease Father    COPD Brother     Social History   Tobacco Use   Smoking status: Never   Smokeless tobacco: Never  Vaping Use   Vaping Use: Never used  Substance  Use Topics   Alcohol use: Not Currently   Drug use: Never    Home Medications Prior to Admission medications   Medication Sig Start Date End Date Taking? Authorizing Provider  blood glucose meter kit and supplies Dispense based on patient and insurance preference. Use up to four times daily as directed. (FOR ICD-10 E10.9, E11.9). Patient not taking: No sig reported 02/15/20   [name], NP  gabapentin (NEURONTIN) 100 MG capsule Take 1 capsule (100 mg total) by mouth at bedtime. 08/24/20   Judith Part, MD  lidocaine (LIDODERM) 5 % Place 1 patch onto the skin daily. Remove & Discard patch within 12 hours or as directed by MD Patient not taking: Reported on 08/10/2020 04/28/20   [name], NP  mirabegron ER (MYRBETRIQ) 50 MG TB24 tablet Take 50 mg by mouth daily.    [provider]  naproxen sodium (ALEVE) 220 MG tablet Take 440 mg by mouth 2 (two) times daily with a meal.    [provider]  venlafaxine XR (EFFEXOR-XR) 150 MG 24 hr capsule Take 1 capsule (150 mg total) by mouth daily with breakfast. 07/18/20   [name], NP    Allergies    Demerol [meperidine hcl] and Morphine and related  Review of Systems   Review of Systems  All other systems reviewed and are  negative.  Physical Exam Updated Vital Signs BP 130/66   Pulse 81   Temp 97.6 F (36.4 C) (Oral)   Resp 20   Ht 5' 6" (1.676 m)   Wt 68 kg   SpO2 96%   BMI 24.21 kg/m   Physical Exam Vitals and nursing note reviewed.  Constitutional:      Appearance: She is well-developed.  HENT:     Head: Normocephalic and atraumatic.  Eyes:     Conjunctiva/sclera: Conjunctivae normal.     Pupils: Pupils are equal, round, and reactive to light.  Neck:     Trachea: Phonation normal.  Cardiovascular:     Rate and Rhythm: Normal rate and regular rhythm.  Pulmonary:     Effort: Pulmonary effort is normal.     Breath sounds: Normal breath sounds.  Chest:     Chest wall: No tenderness.   Abdominal:     General: There is no distension.     Palpations: Abdomen is soft.     Tenderness: There is no abdominal tenderness. There is no guarding.  Genitourinary:    Comments: According to nursing, who catheterized the patient, there was no blood on the perineum or the external genitalia.  The I and O cath was uncomplicated, with return of bloody appearing urine. Musculoskeletal:        General: Normal range of motion.     Cervical back: Normal range of motion and neck supple.  Skin:    General: Skin is warm and dry.  Neurological:     Mental Status: She is alert and oriented to person, place, and time.     Motor: No abnormal muscle tone.  Psychiatric:        Behavior: Behavior normal.        Thought Content: Thought content normal.        Judgment: Judgment normal.    ED Results / Procedures / Treatments   Labs (all labs ordered are listed, but only abnormal results are displayed) Labs Reviewed  COMPREHENSIVE METABOLIC PANEL - Abnormal; Notable for the following components:      Result Value   Sodium 128 (*)    Glucose, Bld 167 (*)    BUN 27 (*)    Calcium 8.3 (*)    Albumin 2.5 (*)    Alkaline Phosphatase 151 (*)    Total Bilirubin 0.1 (*)    Anion gap 3 (*)    All other components within normal limits  CBC - Abnormal; Notable for the following components:   RBC 2.91 (*)    Hemoglobin 8.9 (*)    HCT 27.8 (*)    Platelets 496 (*)    All other components within normal limits  URINALYSIS, ROUTINE W REFLEX MICROSCOPIC - Abnormal; Notable for the following components:   Color, Urine RED (*)    APPearance HAZY (*)    Glucose, UA   (*)    Value: TEST NOT REPORTED DUE TO COLOR INTERFERENCE OF URINE PIGMENT   Hgb urine dipstick   (*)    Value: TEST NOT REPORTED DUE TO COLOR INTERFERENCE OF URINE PIGMENT   Bilirubin Urine   (*)    Value: TEST NOT REPORTED DUE TO COLOR INTERFERENCE OF URINE PIGMENT   Ketones, ur   (*)    Value: TEST NOT REPORTED DUE TO COLOR  INTERFERENCE OF URINE PIGMENT   Nitrite   (*)    Value: TEST NOT REPORTED DUE TO COLOR INTERFERENCE OF URINE PIGMENT   Leukocytes,Ua   (*)      Value: TEST NOT REPORTED DUE TO COLOR INTERFERENCE OF URINE PIGMENT   All other components within normal limits  URINALYSIS, MICROSCOPIC (REFLEX) - Abnormal; Notable for the following components:   Bacteria, UA MANY (*)    All other components within normal limits  POC OCCULT BLOOD, ED - Abnormal; Notable for the following components:   Fecal Occult Bld POSITIVE (*)    All other components within normal limits  URINE CULTURE  TYPE AND SCREEN    EKG None  Radiology No results found.  Procedures .Critical Care  Date/Time: 09/18/2020 8:39 PM Performed by: Wentz, Elliott, MD Authorized by: Wentz, Elliott, MD   Critical care provider statement:    Critical care time (minutes):  35   Critical care start time:  09/18/2020 3:05 PM   Critical care end time:  09/18/2020 8:39 PM   Critical care time was exclusive of:  Separately billable procedures and treating other patients   Critical care was necessary to treat or prevent imminent or life-threatening deterioration of the following conditions:  Circulatory failure   Critical care was time spent personally by me on the following activities:  Blood draw for specimens, development of treatment plan with patient or surrogate, discussions with consultants, evaluation of patient's response to treatment, examination of patient, obtaining history from patient or surrogate, ordering and performing treatments and interventions, ordering and review of laboratory studies, pulse oximetry, re-evaluation of patient's condition, review of old charts and ordering and review of radiographic studies   Medications Ordered in ED Medications  cefTRIAXone (ROCEPHIN) 1 g in sodium chloride 0.9 % 100 mL IVPB (0 g Intravenous Stopped 09/18/20 1658)  sodium chloride 0.9 % bolus 500 mL (0 mLs Intravenous Stopped 09/18/20 1754)   HYDROcodone-acetaminophen (NORCO/VICODIN) 5-325 MG per tablet 1 tablet (1 tablet Oral Given 09/18/20 1630)    ED Course  I have reviewed the triage vital signs and the nursing notes.  Pertinent labs & imaging results that were available during my care of the patient were reviewed by me and considered in my medical decision making (see chart for details).    MDM Rules/Calculators/A&P                           Patient Vitals for the past 24 hrs:  BP Temp Temp src Pulse Resp SpO2 Height Weight  09/18/20 1945 -- -- -- 81 20 96 % -- --  09/18/20 1930 130/66 -- -- 80 (!) 22 97 % -- --  09/18/20 1922 (!) 117/51 97.6 F (36.4 C) Oral 88 18 98 % -- --  09/18/20 1915 -- -- -- -- 18 -- -- --  09/18/20 1830 (!) 124/107 -- -- 85 17 98 % -- --  09/18/20 1803 (!) 110/55 -- -- -- -- -- -- --  09/18/20 1800 (!) 86/62 -- -- 84 17 97 % -- --  09/18/20 1730 (!) 120/58 -- -- 86 (!) 24 100 % -- --  09/18/20 1700 (!) 106/54 -- -- 84 19 96 % -- --  09/18/20 1630 (!) 100/51 -- -- 83 17 99 % -- --  09/18/20 1600 118/61 -- -- 86 (!) 27 100 % -- --  09/18/20 1530 105/89 -- -- 99 16 97 % -- --  09/18/20 1430 (!) 102/50 -- -- 94 17 97 % -- --  09/18/20 1419 (!) 108/46 98.1 F (36.7 C) Oral 93 16 100 % -- --  09/18/20 1416 -- -- -- -- -- -- 5'   6" (1.676 m) 68 kg    8:39 PM Reevaluation with update and discussion. After initial assessment and treatment, an updated evaluation reveals she is comfortable at this time complains of some pain in her rectum.  She is unsure if that is related to her surgery site.  Findings discussed and questions answered. Elliott Wentz   Medical Decision Making:  This patient is presenting for evaluation of bleeding, which does require a range of treatment options, and is a complaint that involves a high risk of morbidity and mortality. The differential diagnoses include UTI, vaginal bleeding, rectal. I decided to review old records, and in summary elderly female recovering from  back surgery, presenting with bleeding on the perineum following an episode of urinary tract infection 10 days ago.  I did not require additional historical information from anyone.  Clinical Laboratory Tests Ordered, included CBC, Metabolic panel, Urinalysis, and blood type, stool for occult blood . Review indicates urinalysis indicative of UTI with marked blood preventing complete analysis, sodium low, glucose high, calcium low, albumin low, alkaline phosphatase high, hemoglobin low, platelets high, stool occult positive.   Critical Interventions-clinical evaluation, laboratory testing, IV fluids, laboratory testing, medication treatment, observation reassess  After These Interventions, the Patient was reevaluated and was found with recurrent hematuria and likely UTI.  Prior UTI, 10 days ago with culture positive for Proteus and E. coli, both sensitive to prescribed medication, Septra.  She appears to have recurrent UTI without signs and symptoms of sepsis or pyelonephritis.  Hemoglobin low, decreased since 09/08/2020, and stool which is yellow in color is guaiac positive.  Unclear if this is contaminant off the perineum.  No good clinical history for active GI bleeding.   Repeat urine culture sent today.  Since the patient appears to have multifactorial anemia, possibly decreasing rapidly over the last 10 days, we will asked the hospitalist to admit her for observation   CRITICAL CARE-yes Performed by: Elliott Wentz  Nursing Notes Reviewed/ Care Coordinated Applicable Imaging Reviewed Interpretation of Laboratory Data incorporated into ED treatment  8:23 PM-Consult complete with hospitalist. Patient case explained and discussed.  He agrees to admit patient for further evaluation and treatment. Call ended at 8:38 PM    Final Clinical Impression(s) / ED Diagnoses Final diagnoses:  Hemorrhagic cystitis  Guaiac positive stools  Anemia, unspecified type    Rx / DC Orders ED Discharge Orders      None        Wentz, Elliott, MD 09/18/20 2040  

## 2020-09-18 NOTE — ED Notes (Signed)
Call placed to inpatient unit Dept 300 to give report. Unable to give report at this time. Will call this RN back.

## 2020-09-18 NOTE — ED Notes (Signed)
When collecting urine sample from urethra, patient had blood in urine specimen and has dark, bloody urine coming from purewick into suction canister.

## 2020-09-18 NOTE — ED Notes (Addendum)
Patient's daughter stephanie lucas given update at this time. Patient repositioned in bed.

## 2020-09-18 NOTE — ED Triage Notes (Signed)
Pt presents to ED via [facility] from [facility] for rectal bleeding. Pt states she has had bleeding from either her vagina or rectum for about 6-7 weeks.

## 2020-09-19 DIAGNOSIS — D649 Anemia, unspecified: Secondary | ICD-10-CM

## 2020-09-19 DIAGNOSIS — D62 Acute posthemorrhagic anemia: Secondary | ICD-10-CM

## 2020-09-19 DIAGNOSIS — A419 Sepsis, unspecified organism: Secondary | ICD-10-CM

## 2020-09-19 DIAGNOSIS — R195 Other fecal abnormalities: Secondary | ICD-10-CM

## 2020-09-19 DIAGNOSIS — K922 Gastrointestinal hemorrhage, unspecified: Secondary | ICD-10-CM

## 2020-09-19 LAB — GLUCOSE, CAPILLARY
Glucose-Capillary: 96 mg/dL (ref 70–99)
Glucose-Capillary: 82 mg/dL (ref 70–99)
Glucose-Capillary: 85 mg/dL (ref 70–99)
Glucose-Capillary: 69 mg/dL — ABNORMAL LOW (ref 70–99)
Glucose-Capillary: 86 mg/dL (ref 70–99)

## 2020-09-19 LAB — CBC
HCT: 25.2 % — ABNORMAL LOW (ref 36.0–46.0)
Hemoglobin: 8.1 g/dL — ABNORMAL LOW (ref 12.0–15.0)
MCH: 30.8 pg (ref 26.0–34.0)
MCHC: 32.1 g/dL (ref 30.0–36.0)
MCV: 95.8 fL (ref 80.0–100.0)
Platelets: 475 10*3/uL — ABNORMAL HIGH (ref 150–400)
RBC: 2.63 MIL/uL — ABNORMAL LOW (ref 3.87–5.11)
RDW: 14 % (ref 11.5–15.5)
WBC: 5.4 10*3/uL (ref 4.0–10.5)
nRBC: 0 % (ref 0.0–0.2)

## 2020-09-19 LAB — BASIC METABOLIC PANEL
Anion gap: 6 (ref 5–15)
BUN: 25 mg/dL — ABNORMAL HIGH (ref 8–23)
CO2: 23 mmol/L (ref 22–32)
Calcium: 8.5 mg/dL — ABNORMAL LOW (ref 8.9–10.3)
Chloride: 102 mmol/L (ref 98–111)
Creatinine, Ser: 0.59 mg/dL (ref 0.44–1.00)
GFR, Estimated: >60 mL/min (ref >60)
Glucose, Bld: 82 mg/dL (ref 70–99)
Potassium: 4.4 mmol/L (ref 3.5–5.1)
Sodium: 131 mmol/L — ABNORMAL LOW (ref 135–145)

## 2020-09-19 LAB — PROTIME-INR
INR: 1.1 (ref 0.8–1.2)
Prothrombin Time: 14.7 seconds (ref 11.4–15.2)

## 2020-09-19 LAB — MRSA NEXT GEN BY PCR, NASAL: MRSA by PCR Next Gen: DETECTED — AB

## 2020-09-19 LAB — FERRITIN: Ferritin: 88 ng/mL (ref 11–307)

## 2020-09-19 LAB — HEMOGLOBIN AND HEMATOCRIT, BLOOD
HCT: 24.5 % — ABNORMAL LOW (ref 36.0–46.0)
HCT: 22.8 % — ABNORMAL LOW (ref 36.0–46.0)
HCT: 21.6 % — ABNORMAL LOW (ref 36.0–46.0)
Hemoglobin: 7.7 g/dL — ABNORMAL LOW (ref 12.0–15.0)
Hemoglobin: 7.2 g/dL — ABNORMAL LOW (ref 12.0–15.0)
Hemoglobin: 6.9 g/dL — CL (ref 12.0–15.0)

## 2020-09-19 LAB — CORTISOL-AM, BLOOD: Cortisol - AM: 12.9 ug/dL (ref 6.7–22.6)

## 2020-09-19 LAB — HEMOGLOBIN A1C
Hgb A1c MFr Bld: 5.5 % (ref 4.8–5.6)
Mean Plasma Glucose: 111.15 mg/dL

## 2020-09-19 LAB — IRON AND TIBC
Iron: 22 ug/dL — ABNORMAL LOW (ref 28–170)
Saturation Ratios: 9 % — ABNORMAL LOW (ref 10.4–31.8)
TIBC: 248 ug/dL — ABNORMAL LOW (ref 250–450)
UIBC: 226 ug/dL

## 2020-09-19 LAB — ABO/RH: ABO/RH(D): A POS

## 2020-09-19 LAB — PROCALCITONIN: Procalcitonin: <0.1 ng/mL

## 2020-09-19 LAB — PREPARE RBC (CROSSMATCH)

## 2020-09-19 MED ORDER — MUPIROCIN 2 % EX OINT
1.0000 "application " | TOPICAL_OINTMENT | Freq: Two times a day (BID) | CUTANEOUS | Status: AC
  Administered 2020-09-19 – 2020-09-23 (×10): 1 via NASAL
  Filled 2020-09-19 (×2): qty 22

## 2020-09-19 MED ORDER — PEG 3350-KCL-NA BICARB-NACL 420 G PO SOLR
4000.0000 mL | Freq: Once | ORAL | Status: AC
  Administered 2020-09-19: 4000 mL via ORAL

## 2020-09-19 MED ORDER — DEXTROSE-NACL 5-0.9 % IV SOLN: INTRAVENOUS | Status: DC

## 2020-09-19 MED ORDER — ZINC OXIDE 40 % EX OINT
TOPICAL_OINTMENT | Freq: Four times a day (QID) | CUTANEOUS | Status: AC | PRN
  Filled 2020-09-19: qty 57

## 2020-09-19 MED ORDER — SODIUM CHLORIDE 0.9% IV SOLUTION: Freq: Once | INTRAVENOUS | Status: AC

## 2020-09-19 MED ORDER — CHLORHEXIDINE GLUCONATE CLOTH 2 % EX PADS
6.0000 | MEDICATED_PAD | Freq: Every day | CUTANEOUS | Status: AC
  Administered 2020-09-19 – 2020-09-24 (×5): 6 via TOPICAL

## 2020-09-19 MED ORDER — SODIUM CHLORIDE 0.9 % IV SOLN: INTRAVENOUS | Status: DC

## 2020-09-19 NOTE — TOC Initial Note (Signed)
Transition of Care [facility]) - Initial/Assessment Note    Patient Details  Name: Krystal Ali MRN: [id number] Date of Birth: May 15, 1945  Transition of Care [facility]) CM/SW Contact:    Salome Arnt, [place] Phone Number: 09/19/2020, 9:30 AM  Clinical Narrative:  Pt admitted due to acute blood loss anemia. She has been a resident at [facility] for the past 7 weeks following back surgery. LCSW spoke with pt's daughter, Colletta [place] to complete assessment. She states pain has been limiting rehab, so pt has been making slower progress than they were hoping for. She requests return to [facility] when medically stable. Per Jackelyn Poling at [facility], okay to return at d/c.                  Expected Discharge Plan: Skilled Nursing Facility Barriers to Discharge: Continued Medical Work up   Patient Goals and CMS Choice Patient states their goals for this hospitalization and ongoing recovery are:: return to SNF   Choice offered to / list presented to : Adult Children  Expected Discharge Plan and Services Expected Discharge Plan: Skilled Nursing Facility In-house Referral: Clinical Social Work   Post Acute Care Choice: Resumption of Product/process development scientist, [place] Living arrangements for the past 2 months: [place]                 DME Arranged: N/A                    Prior Living Arrangements/Services Living arrangements for the past 2 months: [place] Lives with:: Facility Resident Patient language and need for interpreter reviewed:: Yes Do you feel safe going back to the place where you live?: Yes            Criminal Activity/Legal Involvement Pertinent to Current Situation/Hospitalization: No - Comment as needed  Activities of Daily Living Home Assistive Devices/Equipment: Wheelchair ADL Screening (condition at time of admission) Patient's cognitive ability adequate to safely complete daily activities?: Yes Is the patient deaf or have  difficulty hearing?: No Does the patient have difficulty seeing, even when wearing glasses/contacts?: No Does the patient have difficulty concentrating, remembering, or making decisions?: No Patient able to express need for assistance with ADLs?: Yes Does the patient have difficulty dressing or bathing?: Yes Independently performs ADLs?: Yes (appropriate for developmental age) Does the patient have difficulty walking or climbing stairs?: Yes Weakness of Legs: Both Weakness of Arms/Hands: None  Permission Sought/Granted   Permission granted to share information with : Yes, Verbal Permission Granted     Permission granted to share info w AGENCY: [facility]  Permission granted to share info w Relationship: SNF     Emotional Assessment         Alcohol / Substance Use: Not Applicable Psych Involvement: No (comment)  Admission diagnosis:  Hemorrhagic cystitis [N30.91] GI bleeding [K92.2] Guaiac positive stools [R19.5] Anemia, unspecified type [D64.9] Patient Active Problem List   Diagnosis Date Noted   GI bleeding 09/18/2020   Lumbar stenosis with neurogenic claudication 08/18/2020   Anxiety 07/18/2020   Depression, recurrent ([place]) 07/18/2020   Chronic low back pain with right-sided sciatica 05/01/2020   Overactive bladder 02/15/2020   Diabetes mellitus without complication ([place]) 32/44/0102   History of back surgery 02/15/2020   PCP:  Caprice Renshaw, MD Pharmacy:   [facility] [address], [address] [place] [address] Phone: [phone] Fax: [phone]  [id number] - [place], [place] - [address] AT [place] [address] [place] [address] Phone: [phone] Fax: [phone]     Social Determinants of Health (SDOH) Interventions    Readmission Risk Interventions No flowsheet data found.

## 2020-09-19 NOTE — Progress Notes (Signed)
PROGRESS NOTE    Krystal Ali  PRX:[id number] DOB: [birth year] DOA: 09/18/2020 PCP: Caprice Renshaw, MD    Chief Complaint  Patient presents with   Rectal Bleeding    Brief Narrative:  As per H&P written by Dr. Sidney Ace on 09/18/20 Krystal Ali is a 75 y.o. female with medical history significant for type 2 diabetes mellitus, GERD, hypertension, dyslipidemia, depression and anxiety, overactive bladder, lumbar stenosis and urinary retention, who presented to the emergency room with acute onset of suspected rectal bleeding versus hematuria versus vaginal bleeding.  The patient has been having intermittent hematuria for the last several weeks.  The patient was seen here on 7/1 and treated for UTI with p.o. Bactrim.  Urine culture then grew E. coli that had MIC less than 24 Bactrim and 44 Proteus mirabilis.  Both bacteria had MIC less than 0.254 ceftriaxone.  She has been having continued dysuria, urinary frequency and urgency with hematuria.  She denies any nausea or vomiting but has been having lower abdominal discomfort and malaise.  She is recovering from back surgery 7 weeks ago and is currently at rehab.  She has some residual low back pain that radiates to both flanks but it has been getting better.  She denies any headache or dizziness or blurred vision.  No chest pain or dyspnea or palpitations, cough or wheezing or hemoptysis.  No dyspnea on exertion.  No other bleeding diathesis.  ED Course: Upon position to the emergency room, blood pressure was 108/46 with a heart rate of 93 and respiratory to 16.  Temperature was 98.1 and pulse ox 91% on room air.  Later respiratory rate was up to 22-24 with pulse oximetry of 97% on room air.[place] Kitchen  Urinalysis was positive for UTI with many bacteria, more than 50 RBCs and 21-50 WBCs.  CMP was remarkable for hyponatremia of 128 and blood glucose of 167 with a BUN of 27 and alk phos of 151 with albumin of 2.5 and total protein 6.6.  CBC showed WBC of 7.7 hemoglobin  8.9 hematocrit 27.8 with platelets of 496.  Her CBC on 7/1 showed hemoglobin of 13 and hematocrit 39.5 with platelets of 378 however on 6/11 hemoglobin was 9 and hematocrit 26.1 with platelets of 181.  The patient's stool Hemoccult came back positive.  Assessment & Plan: 1-acute blood loss anemia secondary to hematuria and GI bleed -GI service on board, will follow rec's; planning for EGD and colonoscopy on 7/13 -patient positive FOBT, intermittent complaints of bloody stools and also complaining of early satiety and weight loss -avoid heparin products and NSAID's -follow Hgb trend, with plans to transfuse if Hgb < 7  2-sepsis due to UTI -follow culture results -continue current antibiotics.  3-hyponatremia -in the setting of poor oral intake and dehydration -will continue IVF's -follow electrolytes trend  4-type 2 diabetes with neuropathy -continue SSI  5-depression and anxiety -no SI or hallucinations -continue effexor  6-overreactive bladder  -continue myrbetriq  7-hemorrhagic cystitis  -urology consulted -continue IV antibiotics -follow culture results.   Anemia   Guaiac positive stools      DVT prophylaxis: scd's Code Status: full code. Family Communication: daughter updated by GI service; unable to reach by me. Case discussed in full details with patient directly. Disposition:   Status is: Inpatient  Remains inpatient appropriate because:IV treatments appropriate due to intensity of illness or inability to take PO  Dispo: The patient is from: SNF  Anticipated d/c is to: SNF              Patient currently is not medically stable to d/c.   Difficult to place patient No       Consultants:  Urology GI service.  Procedures:  See below for x-ray reports.  Antimicrobials:  Rocephin    Subjective: Positive hematuria; patient complaining of back pain and sciatica, no CP, no SOB, no nausea, no vomiting.  Objective: Vitals:   09/18/20  2204 09/19/20 0223 09/19/20 0610 09/19/20 1443  BP: (!) 113/49 (!) 97/53 (!) 118/52 115/60  Pulse: 79 93 83 74  Resp: (!) 21   20  Temp: 98.2 F (36.8 C) 97.7 F (36.5 C) 98 F (36.7 C) 98.5 F (36.9 C)  TempSrc: Oral Oral Oral Oral  SpO2: 98% 96% 98% 100%  Weight: 63.8 kg     Height: 5' 5"  (1.651 m)       Intake/Output Summary (Last 24 hours) at 09/19/2020 2025 Last data filed at 09/19/2020 1700 Gross per 24 hour  Intake 2743.02 ml  Output 500 ml  Net 2243.02 ml   Filed Weights   09/18/20 1416 09/18/20 2204  Weight: 68 kg 63.8 kg    Examination: General exam: Afebrile, no CP, no nausea, no vomiting. Reporting lower back pain with sciatica. Positive hematuria seen. Respiratory system: Clear to auscultation. Respiratory effort normal. No requiring oxygen supplementation. Cardiovascular system: S1 & S2 heard, no rubs, no gallops Gastrointestinal system: Abdomen is nondistended, soft and nontender. No organomegaly or masses felt. Normal bowel sounds heard. Central nervous system: Alert and oriented. No focal neurological deficits. Extremities: no cyanosis, no clubbing Skin: No petechiae, patient with protective dressings on her heels and sacrum. Psychiatry: Judgement and insight appear normal. Mood & affect appropriate.     Data Reviewed: I have personally reviewed following labs and imaging studies  CBC: Recent Labs  Lab 09/18/20 1454 09/18/20 2116 09/19/20 0324 09/19/20 0906 09/19/20 1504  WBC 7.7  --  5.4  --   --   HGB 8.9* 8.1* 8.1* 7.7* 7.2*  HCT 27.8* 25.3* 25.2* 24.5* 22.8*  MCV 95.5  --  95.8  --   --   PLT 496*  --  475*  --   --     Basic Metabolic Panel: Recent Labs  Lab 09/18/20 1454 09/19/20 0324  NA 128* 131*  K 4.5 4.4  CL 99 102  CO2 26 23  GLUCOSE 167* 82  BUN 27* 25*  CREATININE 0.81 0.59  CALCIUM 8.3* 8.5*    GFR: Estimated Creatinine Clearance: 55.5 mL/min (by C-G formula based on SCr of 0.59 mg/dL).  Liver Function  Tests: Recent Labs  Lab 09/18/20 1454  AST 29  ALT 24  ALKPHOS 151*  BILITOT 0.1*  PROT 6.6  ALBUMIN 2.5*    CBG: Recent Labs  Lab 09/18/20 2249 09/19/20 0756 09/19/20 1127 09/19/20 1620  GLUCAP 94 96 82 85    Recent Results (from the past 240 hour(s))  MRSA Next Gen by PCR, Nasal     Status: Abnormal   Collection Time: 09/18/20 11:30 PM   Specimen: Nasal Mucosa; Nasal Swab  Result Value Ref Range Status   MRSA by PCR Next Gen DETECTED (A) NOT DETECTED Final    Comment: RESULT CALLED TO, READ BACK BY AND VERIFIED WITH:  DEAN,TRISHA @ 0803 09/19/20 BY STEPTR (NOTE) The GeneXpert MRSA Assay (FDA approved for NASAL specimens only), is one component of a comprehensive MRSA colonization surveillance  program. It is not intended to diagnose MRSA infection nor to guide or monitor treatment for MRSA infections. Test performance is not FDA approved in patients less than 49 years old. Performed at [facility], [address], [place], [place] [address]       Radiology Studies: No results found.   Scheduled Meds:  [START ON 09/20/2020] Chlorhexidine Gluconate Cloth  6 each Topical Q0600   gabapentin  100 mg Oral QHS   insulin aspart  0-9 Units Subcutaneous TID AC & HS   mirabegron ER  50 mg Oral Daily   mupirocin ointment  1 application Nasal BID   venlafaxine XR  150 mg Oral Q breakfast   Continuous Infusions:  sodium chloride 20 mL/hr at 09/19/20 1753   0.9 % NaCl with KCl 20 mEq / L 100 mL/hr at 09/19/20 1559   cefTRIAXone (ROCEPHIN)  IV Stopped (09/19/20 1547)     LOS: 1 day    Time spent: 35 minutes.   Barton Dubois, MD [facility]   To contact the attending provider between 7A-7P or the covering provider during after hours 7P-7A, please log into the web site [email or web address] and access using universal [place] password for that web site. If you do not have the password, please call the hospital operator.  09/19/2020, 8:25 PM

## 2020-09-19 NOTE — Progress Notes (Signed)
At request of patient, I contacted patient's daughter Krystal Ali to update her on plan for Colonoscopy with possible endoscopy tomorrow. Patient's daughter had no further questions at this time.

## 2020-09-19 NOTE — NC FL2 (Signed)
[place] MEDICAID FL2 LEVEL OF CARE SCREENING TOOL     IDENTIFICATION  Patient Name: Krystal Ali Birthdate: [birth year] Sex: female Admission Date (Current Location): 09/18/2020  [place] and [place] Number:  [place] [facility] [id number] [place] and Address:  [facility] [address], [place]      Provider Number: [id number]  Attending Physician Name and Address:  Christel Mormon, MD  Relative Name and Phone Number:       Current Level of Care: Hospital Recommended Level of Care: [place] Prior Approval Number:    Date Approved/Denied:   PASRR Number:    Discharge Plan: SNF    Current Diagnoses: Patient Active Problem List   Diagnosis Date Noted   GI bleeding 09/18/2020   Lumbar stenosis with neurogenic claudication 08/18/2020   Anxiety 07/18/2020   Depression, recurrent ([place]) 07/18/2020   Chronic low back pain with right-sided sciatica 05/01/2020   Overactive bladder 02/15/2020   Diabetes mellitus without complication ([place]) 42/59/5638   History of back surgery 02/15/2020    Orientation RESPIRATION BLADDER Height & Weight     Self, Time, Place  Normal External catheter Weight: 140 lb 10.5 oz (63.8 kg) Height:  5\' 5"  (165.1 cm)  BEHAVIORAL SYMPTOMS/MOOD NEUROLOGICAL BOWEL NUTRITION STATUS      Incontinent Diet (NPO time specified. See d/c summary for updates.)  AMBULATORY STATUS COMMUNICATION OF NEEDS Skin   Extensive Assist Verbally Skin abrasions                       Personal Care Assistance Level of Assistance  Bathing, Dressing, Feeding Bathing Assistance: Maximum assistance Feeding assistance: Limited assistance Dressing Assistance: Maximum assistance     Functional Limitations Info  Sight, Speech, Hearing Sight Info: Adequate Hearing Info: Adequate Speech Info: Adequate    SPECIAL CARE FACTORS FREQUENCY                       Contractures      Additional Factors Info  Code Status,  Allergies, Isolation Precautions Code Status Info: Full code Allergies Info: Demerol (meperidine Hcl), Morphine and Related     Isolation Precautions Info: MRSA 08/18/20     Current Medications (09/19/2020):  This is the current hospital active medication list Current Facility-Administered Medications  Medication Dose Route Frequency Provider Last Rate Last Admin   0.9 % NaCl with KCl 20 mEq/ L  infusion   Intravenous Continuous Mansy, Jan A, MD 100 mL/hr at 09/18/20 2328 New Bag at 09/18/20 2328   acetaminophen (TYLENOL) tablet 650 mg  650 mg Oral Q6H PRN Mansy, Jan A, MD   650 mg at 09/19/20 7564   Or   acetaminophen (TYLENOL) suppository 650 mg  650 mg Rectal Q6H PRN Mansy, Jan A, MD       cefTRIAXone (ROCEPHIN) 1 g in sodium chloride 0.9 % 100 mL IVPB  1 g Intravenous Q24H Mansy, Jan A, MD       gabapentin (NEURONTIN) capsule 100 mg  100 mg Oral QHS Mansy, Jan A, MD   100 mg at 09/18/20 2324   insulin aspart (novoLOG) injection 0-9 Units  0-9 Units Subcutaneous TID AC & HS Mansy, Jan A, MD       mirabegron [facility]) tablet 50 mg  50 mg Oral Daily Mansy, Jan A, MD       ondansetron [facility]) tablet 4 mg  4 mg Oral Q6H PRN Mansy, Arvella Merles, MD  Or   ondansetron (ZOFRAN) injection 4 mg  4 mg Intravenous Q6H PRN Mansy, Jan A, MD       traZODone (DESYREL) tablet 25 mg  25 mg Oral QHS PRN Mansy, Jan A, MD   25 mg at 09/18/20 2324   venlafaxine XR (EFFEXOR-XR) 24 hr capsule 150 mg  150 mg Oral Q breakfast Mansy, Arvella Merles, MD         Discharge Medications: Please see discharge summary for a list of discharge medications.  Relevant Imaging Results:  Relevant Lab Results:   Additional Information    Salome Arnt, LCSW

## 2020-09-19 NOTE — Consult Note (Signed)
Referring Provider: Dr. Sidney Ace Primary Care Physician:  Caprice Renshaw, MD Primary Gastroenterologist:  Dr.  Date of Admission: 09/18/20 Date of Consultation: 09/19/20  Reason for Consultation:  Acute blood loss anemia, suspected GI bleeding  HPI:  Krystal Ali is a 75 y.o. year old female coming from a rehab facility where she is residing currently following a recent back surgery on 08/18/20. Patient has significant medical history of type 2 DM, GERD, HTN, dyslipidemia, depression and anxiety, overactive bladder and urinary retention who presented to the emergency department with acute onset of suspected rectal bleeding vs hematuria vs vaginal bleeding.  ED Course: She presented to the ED last night with reports of intermittent hematuria and bleeding (unsure if rectal or vaginal) over the past few weeks. She was seen previously in the ED on 7/1 and treated for a UTI with Bactrim. Urine culture later revealed E. Coli and no further antibiotics were prescribed due to organism sensitivity to bactrim. Last night She endorsed continued dysuria, urinary frequency and urgency, as well as hematuria. She also c/o lower abdominal pain as well as malaise. Did report frequent naproxen use.   Patient hypotensive at 108/46 while in ED, HR 93, RR 16, afebrile. Urinalysis revealed UTI. WBC 7.7, Hgb 8.9. CBC on 7/1 showed Hgb of 13, however patient had low hemoglobin of 9 prior to that, on 6/11. CMP revealed hyponatremia with Na of 128, BUN mildly elevated to 27, alk phos 151 (up from 141 on 7/1), albumin 2.5 and total protein 6.6. Marland Kitchen She was Treated for mild sepsis with IV hydration with NS+KCL and IV rocephin for urinary infection.  Per nursing notes, there was also a question of vaginal bleeding vs rectal bleeding as blood was noted when changing patient's brief, however, source was indeterminable at the time. Patient had heme positive stool in ED.   Today:  Patient endorses ongoing lower abdominal pain. She  states that she had noticed bright red bleeding when she would change her brief for about 6 weeks, she states she is unsure if blood was coming from rectum or vagina. She denies any black stools or blood on her stools. She endorses recent intermittent episodes of constipation and diarrhea prior to noticing the bleeding. She was given multiple different medications to help with constipation by the rehab facility staff, but is unsure what they gave her. She denies any history of rectal bleeding. She reports BMs very rarely, sometimes not even once per week. She denies passing flatus, nausea or vomiting. Endorses some early satiety and decreased appetite since her back surgery. Denies any reflux symptoms and not currently on any outpatient PPIs. She does endorse some rectal pain that is constant, states she has noticed this over the past few months and it does not seem to be associated with BMs. She denies any bowel movements since admission but endorses ongoing urge to have BM.  She also reports significant weight loss over the past few months, states she was around 200 lbs last year, 140 lbs this admission.  She reports taking 1-2 naprox per day for pain since her back surgery on 08/18/20. Denies any ETOH currently or significant use in the past. No personal history of GI bleeding.  Sodium improved to 131 over night, BUN remains mildly elevated at 25, Hemoglobin trending down, now 7.7 (8.9 on admission). Continued mild hypotension at 118/52, however, vital signs otherwise WNL and stable. External urinary catheter canister with hematuria noted in patient's room.   No current anticoagulation.  Last colonoscopy: reports colonoscopy many years ago, unsure of findings Last endoscopy: n/a  Family GI History: no significant family history of GI/hepatic related issues  Past Medical History:  Diagnosis Date   Anxiety    Diabetes mellitus without complication (HCC)    GERD (gastroesophageal reflux disease)     Hyperlipidemia    Hypertension    Retention of urine, unspecified    Spinal stenosis of lumbar region without neurogenic claudication     Past Surgical History:  Procedure Laterality Date   APPENDECTOMY     BREAST SURGERY     CHOLECYSTECTOMY      Prior to Admission medications   Medication Sig Start Date End Date Taking? Authorizing Provider  blood glucose meter kit and supplies Dispense based on patient and insurance preference. Use up to four times daily as directed. (FOR ICD-10 E10.9, E11.9). Patient not taking: No sig reported 02/15/20   [name], NP  gabapentin (NEURONTIN) 100 MG capsule Take 1 capsule (100 mg total) by mouth at bedtime. 08/24/20   Judith Part, MD  lidocaine (LIDODERM) 5 % Place 1 patch onto the skin daily. Remove & Discard patch within 12 hours or as directed by MD Patient not taking: Reported on 08/10/2020 04/28/20   [name], NP  mirabegron ER (MYRBETRIQ) 50 MG TB24 tablet Take 50 mg by mouth daily.    [provider]  naproxen sodium (ALEVE) 220 MG tablet Take 440 mg by mouth 2 (two) times daily with a meal.    [provider]  venlafaxine XR (EFFEXOR-XR) 150 MG 24 hr capsule Take 1 capsule (150 mg total) by mouth daily with breakfast. 07/18/20   [name], NP    Current Facility-Administered Medications  Medication Dose Route Frequency Provider Last Rate Last Admin   0.9 % NaCl with KCl 20 mEq/ L  infusion   Intravenous Continuous Mansy, Jan A, MD 100 mL/hr at 09/18/20 2328 New Bag at 09/18/20 2328   acetaminophen (TYLENOL) tablet 650 mg  650 mg Oral Q6H PRN Mansy, Jan A, MD   650 mg at 09/19/20 1102   Or   acetaminophen (TYLENOL) suppository 650 mg  650 mg Rectal Q6H PRN Mansy, Jan A, MD       cefTRIAXone (ROCEPHIN) 1 g in sodium chloride 0.9 % 100 mL IVPB  1 g Intravenous Q24H Mansy, Jan A, MD       gabapentin (NEURONTIN) capsule 100 mg  100 mg Oral QHS Mansy, Jan A, MD   100 mg at 09/18/20 2324   insulin aspart  (novoLOG) injection 0-9 Units  0-9 Units Subcutaneous TID AC & HS Mansy, Jan A, MD       mirabegron ER (MYRBETRIQ) tablet 50 mg  50 mg Oral Daily Mansy, Jan A, MD       ondansetron [facility]) tablet 4 mg  4 mg Oral Q6H PRN Mansy, Jan A, MD       Or   ondansetron [facility]) injection 4 mg  4 mg Intravenous Q6H PRN Mansy, Jan A, MD       traZODone (DESYREL) tablet 25 mg  25 mg Oral QHS PRN Mansy, Jan A, MD   25 mg at 09/18/20 2324   venlafaxine XR (EFFEXOR-XR) 24 hr capsule 150 mg  150 mg Oral Q breakfast Mansy, Jan A, MD        Allergies as of 09/18/2020 - Review Complete 09/18/2020  Allergen Reaction Noted   Demerol [meperidine hcl]  06/08/2020  Morphine and related  02/15/2020    Family History  Problem Relation Age of Onset   Heart disease Father    COPD Brother     Social History   Socioeconomic History   Marital status: Divorced    Spouse name: Not on file   Number of children: Not on file   Years of education: Not on file   Highest education level: Not on file  Occupational History   Not on file  Tobacco Use   Smoking status: Never   Smokeless tobacco: Never  Vaping Use   Vaping Use: Never used  Substance and Sexual Activity   Alcohol use: Not Currently   Drug use: Never   Sexual activity: Not Currently  Other Topics Concern   Not on file  Social History Narrative   Lives at [place] Determinants of Health   Financial Resource Strain: Not on file  Food Insecurity: Not on file  Transportation Needs: Not on file  Physical Activity: Not on file  Stress: Not on file  Social Connections: Not on file  Intimate Partner Violence: Not on file    Review of Systems: Gen: Denies fever, chills, loss of appetite, change in weight or weight loss CV: Denies chest pain, heart palpitations, syncope, edema  Resp: Denies shortness of breath with rest, cough, wheezing GI: Denies dysphagia or odynophagia. Denies vomiting blood, jaundice, and fecal incontinence.  GU :  Denies urinary burning, urinary frequency, urinary incontinence.  MS: Denies joint pain,swelling, cramping Derm: Denies rash, itching, dry skin Psych: Denies depression, anxiety,confusion, or memory loss Heme: Denies bruising, bleeding, and enlarged lymph nodes.  Physical Exam: Vital signs in last 24 hours: Temp:  [97.6 F (36.4 C)-98.2 F (36.8 C)] 98 F (36.7 C) (07/12 0610) Pulse Rate:  [79-99] 83 (07/12 0610) Resp:  [16-27] 21 (07/11 2204) BP: (86-130)/(46-107) 118/52 (07/12 0610) SpO2:  [96 %-100 %] 98 % (07/12 0610) Weight:  [63.8 kg-68 kg] 63.8 kg (07/11 2204) Last BM Date: 09/18/20 General:   Alert,  Well-developed, well-nourished, pleasant and cooperative in NAD Head:  Normocephalic and atraumatic. Lungs:  Clear throughout to auscultation.   No wheezes, crackles, or rhonchi. No acute distress. Heart:  Regular rate and rhythm; no murmurs, clicks, rubs,  or gallops. Abdomen:  Soft, nontender and nondistended. No masses, hepatosplenomegaly or hernias noted. Hypoactive bowel sounds, without guarding, and without rebound.   Rectal:  unremarkable, no masses or lesions, no over bleeding noted. Msk:  Symmetrical without gross deformities. Normal posture. Extremities:  Without edema. Neurologic:  Alert and  oriented x4;  grossly normal neurologically. Skin:  Intact, wound dressings to sacral area as well as heels.  Psych:  Alert and cooperative. Normal mood and affect.  Intake/Output from previous day: 07/11 0701 - 07/12 0700 In: 1386.8 [P.O.:240; I.V.:546.8; IV Piggyback:600] Out: -  Intake/Output this shift: No intake/output data recorded.  Lab Results: Recent Labs    09/18/20 1454 09/18/20 2116 09/19/20 0324  WBC 7.7  --  5.4  HGB 8.9* 8.1* 8.1*  HCT 27.8* 25.3* 25.2*  PLT 496*  --  475*   BMET Recent Labs    09/18/20 1454 09/19/20 0324  NA 128* 131*  K 4.5 4.4  CL 99 102  CO2 26 23  GLUCOSE 167* 82  BUN 27* 25*  CREATININE 0.81 0.59  CALCIUM 8.3* 8.5*    LFT Recent Labs    09/18/20 1454  PROT 6.6  ALBUMIN 2.5*  AST 29  ALT 24  ALKPHOS 151*  BILITOT 0.1*   PT/INR Recent Labs    09/19/20 0324  LABPROT 14.7  INR 1.1    Impression: Patient is a pleasant 75 year old female who presented to the ED last night with ongoing hematuria after diagnosis of UTI on 7/1, as well as ongoing bright red bleeding noted on her brief when cleaning herself for the past 6 weeks. Her ED course was remarkable for positive UTI on urinalysis for which she is currently being treated with rocephin IV, as well as mild sepsis that she received IV hydration of NS +KCL for. She was found to have heme positive stool with bleeding from perineal area coming from an indeterminable source, (vaginal vs rectal). Her vital signs remain stable today.  Rectal exam was unremarkable for lesions or masses, no overt bleeding noted at time of exam.   Given extended time period since previous colonoscopy, bleeding, weight loss and rectal pain, it would be a good idea to proceed with colonoscopy at this time. Cannot exclude diverticular bleeding or colonic malignancy.  Patient will most likely also need an EGD given her mildly elevated BUN, early satiety, history of GERD and decreased appetite. Differentials include malignancy, small bowel AVM, or PUD.  Given anemia, we will continue to monitor H&H. Trending down, 7.7 this morning, 8.9 on admission, this appears to be acute on chronic, as patient has had low hgb in the past. Will do iron studies as well. Suspect significant drop in hemoglobin is mostly likely related to frank hematuria at this time, given melena or hematochezia, however, cannot exclude GI source.    Indications, risks and benefits of procedure discussed in detail with patient. Patient verbalized understanding and is in agreement to proceed with colonoscopy with possible EGD.   Plan: Advance to clear liquid diet NPO at midnight Plan Colonoscopy with possible  EGD tomorrow, will prep today Continue to trend H&H Will order Iron studies    LOS: 1 day    09/19/2020, 8:28 AM  Tauheed Mcfayden L. Alver Sorrow, MSN, APRN, AGNP-C Adult-Gerontology Nurse Practitioner [facility]

## 2020-09-20 ENCOUNTER — Encounter (HOSPITAL_COMMUNITY): Payer: Self-pay | Admitting: Family Medicine

## 2020-09-20 ENCOUNTER — Inpatient Hospital Stay (HOSPITAL_COMMUNITY): Payer: Medicare Other | Admitting: Anesthesiology

## 2020-09-20 ENCOUNTER — Encounter (HOSPITAL_COMMUNITY)
Admission: EM | Disposition: A | Discharge status: Skilled Nursing Facility | Payer: Self-pay | Source: Skilled Nursing Facility | Attending: Family Medicine

## 2020-09-20 DIAGNOSIS — R634 Abnormal weight loss: Secondary | ICD-10-CM

## 2020-09-20 DIAGNOSIS — K449 Diaphragmatic hernia without obstruction or gangrene: Secondary | ICD-10-CM

## 2020-09-20 DIAGNOSIS — D123 Benign neoplasm of transverse colon: Secondary | ICD-10-CM

## 2020-09-20 DIAGNOSIS — K2289 Other specified disease of esophagus: Secondary | ICD-10-CM

## 2020-09-20 DIAGNOSIS — K644 Residual hemorrhoidal skin tags: Secondary | ICD-10-CM

## 2020-09-20 DIAGNOSIS — D509 Iron deficiency anemia, unspecified: Secondary | ICD-10-CM

## 2020-09-20 DIAGNOSIS — D122 Benign neoplasm of ascending colon: Secondary | ICD-10-CM

## 2020-09-20 DIAGNOSIS — K259 Gastric ulcer, unspecified as acute or chronic, without hemorrhage or perforation: Secondary | ICD-10-CM

## 2020-09-20 HISTORY — PX: POLYPECTOMY: SHX5525

## 2020-09-20 HISTORY — PX: BIOPSY: SHX5522

## 2020-09-20 HISTORY — PX: ESOPHAGOGASTRODUODENOSCOPY (EGD) WITH PROPOFOL: SHX5813

## 2020-09-20 HISTORY — PX: COLONOSCOPY WITH PROPOFOL: SHX5780

## 2020-09-20 LAB — CBC
HCT: 33.4 % — ABNORMAL LOW (ref 36.0–46.0)
Hemoglobin: 10.7 g/dL — ABNORMAL LOW (ref 12.0–15.0)
MCH: 31.1 pg (ref 26.0–34.0)
MCHC: 32 g/dL (ref 30.0–36.0)
MCV: 97.1 fL (ref 80.0–100.0)
Platelets: 342 10*3/uL (ref 150–400)
RBC: 3.44 MIL/uL — ABNORMAL LOW (ref 3.87–5.11)
RDW: 14.7 % (ref 11.5–15.5)
WBC: 5.2 10*3/uL (ref 4.0–10.5)
nRBC: 0 % (ref 0.0–0.2)

## 2020-09-20 LAB — BASIC METABOLIC PANEL
Anion gap: 6 (ref 5–15)
BUN: 14 mg/dL (ref 8–23)
CO2: 23 mmol/L (ref 22–32)
Calcium: 8 mg/dL — ABNORMAL LOW (ref 8.9–10.3)
Chloride: 103 mmol/L (ref 98–111)
Creatinine, Ser: 0.51 mg/dL (ref 0.44–1.00)
GFR, Estimated: >60 mL/min (ref >60)
Glucose, Bld: 90 mg/dL (ref 70–99)
Potassium: 4.8 mmol/L (ref 3.5–5.1)
Sodium: 132 mmol/L — ABNORMAL LOW (ref 135–145)

## 2020-09-20 LAB — HEMOGLOBIN AND HEMATOCRIT, BLOOD
HCT: 32.2 % — ABNORMAL LOW (ref 36.0–46.0)
HCT: 32.2 % — ABNORMAL LOW (ref 36.0–46.0)
HCT: 32.1 % — ABNORMAL LOW (ref 36.0–46.0)
Hemoglobin: 10.5 g/dL — ABNORMAL LOW (ref 12.0–15.0)
Hemoglobin: 10.8 g/dL — ABNORMAL LOW (ref 12.0–15.0)
Hemoglobin: 10.6 g/dL — ABNORMAL LOW (ref 12.0–15.0)

## 2020-09-20 LAB — GLUCOSE, CAPILLARY
Glucose-Capillary: 97 mg/dL (ref 70–99)
Glucose-Capillary: 98 mg/dL (ref 70–99)
Glucose-Capillary: 88 mg/dL (ref 70–99)
Glucose-Capillary: 73 mg/dL (ref 70–99)
Glucose-Capillary: 105 mg/dL — ABNORMAL HIGH (ref 70–99)

## 2020-09-20 LAB — URINE CULTURE: Culture: NO GROWTH

## 2020-09-20 SURGERY — COLONOSCOPY WITH PROPOFOL
Anesthesia: General

## 2020-09-20 SURGERY — COLONOSCOPY WITH PROPOFOL
Anesthesia: Monitor Anesthesia Care

## 2020-09-20 MED ORDER — GABAPENTIN 100 MG PO CAPS
100.0000 mg | ORAL_CAPSULE | Freq: Two times a day (BID) | ORAL | Status: DC
  Administered 2020-09-20 – 2020-09-24 (×8): 100 mg via ORAL
  Filled 2020-09-20 (×9): qty 1

## 2020-09-20 MED ORDER — LIDOCAINE HCL (CARDIAC) PF 50 MG/5ML IV SOSY
PREFILLED_SYRINGE | INTRAVENOUS | Status: DC | PRN
  Administered 2020-09-20: 100 mg via INTRAVENOUS

## 2020-09-20 MED ORDER — LACTATED RINGERS IV SOLN
INTRAVENOUS | Status: DC
  Administered 2020-09-20: 1000 mL via INTRAVENOUS

## 2020-09-20 MED ORDER — STERILE WATER FOR IRRIGATION IR SOLN
Status: DC | PRN
  Administered 2020-09-20: 1.5 mL

## 2020-09-20 MED ORDER — OXYCODONE HCL 5 MG PO TABS
5.0000 mg | ORAL_TABLET | Freq: Four times a day (QID) | ORAL | Status: DC | PRN
  Administered 2020-09-20 – 2020-09-24 (×11): 5 mg via ORAL
  Filled 2020-09-20 (×12): qty 1

## 2020-09-20 MED ORDER — PROPOFOL 10 MG/ML IV BOLUS
INTRAVENOUS | Status: DC | PRN
  Administered 2020-09-20: 70 mg via INTRAVENOUS
  Administered 2020-09-20: 50 mg via INTRAVENOUS
  Administered 2020-09-20: 125 ug/kg/min via INTRAVENOUS

## 2020-09-20 NOTE — Op Note (Signed)
[facility] Patient Name: Krystal Ali Procedure Date: 09/20/2020 1:22 PM MRN: [id number] Date of Birth: 09-12-1945 Attending MD: Maylon Peppers ,  CSN: [id number] Age: 75 Admit Type: Inpatient Procedure:                Upper GI endoscopy Indications:              Iron deficiency anemia, Weight loss Providers:                Maylon Peppers, Caprice Kluver, [place]er Risa Grill, Technician Referring MD:              Medicines:                Monitored Anesthesia Care Complications:            No immediate complications. Estimated Blood Loss:     Estimated blood loss: none. Procedure:                Pre-Anesthesia Assessment:                           - Prior to the procedure, a History and Physical                            was performed, and patient medications, allergies                            and sensitivities were reviewed. The patient's                            tolerance of previous anesthesia was reviewed.                           - The risks and benefits of the procedure and the                            sedation options and risks were discussed with the                            patient. All questions were answered and informed                            consent was obtained.                           - ASA Grade Assessment: III - A patient with severe                            systemic disease.                           After obtaining informed consent, the endoscope was                            passed under direct vision. Throughout the  procedure, the patient's blood pressure, pulse, and                            oxygen saturations were monitored continuously. The                            GIF-H190 ([id number]) scope was introduced through the                            mouth, and advanced to the second part of duodenum.                            The upper GI endoscopy was accomplished without                             difficulty. The patient tolerated the procedure                            well. Scope In: 1:35:27 PM Scope Out: 1:46:50 PM Total Procedure Duration: 0 hours 11 minutes 23 seconds  Findings:      The Z-line was irregular and was found 35 cm from the incisors.      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      The exam of the stomach was otherwise normal.      The examined duodenum was normal. Biopsies were taken with a cold       forceps for histology. Impression:               - Z-line irregular, 35 cm from the incisors.                           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                           - Normal examined duodenum. Biopsied. Moderate Sedation:      Per Anesthesia Care Recommendation:           - Return patient to hospital ward for ongoing care.                           - Resume previous diet.                           - Await pathology results.                           - Use Protonix (pantoprazole) 40 mg PO BID. Procedure Code(s):        --- Professional ---                           [phone], Esophagogastroduodenoscopy, flexible,                            transoral; with biopsy, single or multiple Diagnosis Code(s):        --- Professional ---  K22.8, Other specified diseases of esophagus                           K44.9, Diaphragmatic hernia without obstruction or                            gangrene                           K25.9, Gastric ulcer, unspecified as acute or                            chronic, without hemorrhage or perforation                           D50.9, Iron deficiency anemia, unspecified                           R63.4, Abnormal weight loss CPT copyright 2019 American Medical Association. All rights reserved. The codes documented in this report are preliminary and upon coder review may  be revised to meet current compliance requirements. Maylon Peppers,  MD Maylon Peppers,  09/20/2020 1:51:16 PM This report has been signed electronically. Number of Addenda: 0

## 2020-09-20 NOTE — Anesthesia Postprocedure Evaluation (Signed)
Anesthesia Post Note  Patient: Krystal Ali  Procedure(s) Performed: COLONOSCOPY WITH PROPOFOL ESOPHAGOGASTRODUODENOSCOPY (EGD) WITH PROPOFOL BIOPSY POLYPECTOMY  Patient location during evaluation: Phase II Anesthesia Type: General Level of consciousness: awake Pain management: pain level controlled Vital Signs Assessment: post-procedure vital signs reviewed and stable Respiratory status: spontaneous breathing and respiratory function stable Cardiovascular status: blood pressure returned to baseline and stable Postop Assessment: no headache and no apparent nausea or vomiting Anesthetic complications: no Comments: Late entry   No notable events documented.   Last Vitals:  Vitals:   09/20/20 1459 09/20/20 1526  BP: 130/68 139/60  Pulse: 68 (!) 58  Resp: 18 19  Temp:    SpO2: 98% 99%    Last Pain:  Vitals:   09/20/20 1459  TempSrc:   PainSc: 0-No pain                 Louann Sjogren

## 2020-09-20 NOTE — Transfer of Care (Signed)
Immediate Anesthesia Transfer of Care Note  Patient: Jadon Ressler [place]  Procedure(s) Performed: COLONOSCOPY WITH PROPOFOL ESOPHAGOGASTRODUODENOSCOPY (EGD) WITH PROPOFOL BIOPSY POLYPECTOMY  Patient Location: Endoscopy Unit  Anesthesia Type:General  Level of Consciousness: awake, drowsy, patient cooperative and responds to stimulation  Airway & Oxygen Therapy: Patient Spontanous Breathing  Post-op Assessment: Report given to RN, Post -op Vital signs reviewed and stable and Patient moving all extremities X 4  Post vital signs: Reviewed and stable  Last Vitals:  Vitals Value Taken Time  BP 119/65 09/20/20 1452  Temp 36.6 C 09/20/20 1452  Pulse 73 09/20/20 1452  Resp 24 09/20/20 1452  SpO2 97 % 09/20/20 1452    Last Pain:  Vitals:   09/20/20 1452  TempSrc: Oral  PainSc: 0-No pain         Complications: No notable events documented.

## 2020-09-20 NOTE — Brief Op Note (Signed)
09/18/2020 - 09/20/2020  2:54 PM  PATIENT:  Krystal Ali  75 y.o. female  PRE-OPERATIVE DIAGNOSIS:  anemia, heme positive stool, early satiety, decreased appetitie, weight loss  POST-OPERATIVE DIAGNOSIS:  EGD: cameron lesions, 5 cm hiatal hernia COLON: descending colon polyps x3 (CS), ascending colon polyps (CS x3, HS x1)one not retrieved, AVM ascending colon, hemorrhoids  PROCEDURE:  Procedure(s): COLONOSCOPY WITH PROPOFOL (N/A) ESOPHAGOGASTRODUODENOSCOPY (EGD) WITH PROPOFOL (N/A) BIOPSY POLYPECTOMY  SURGEON:  Surgeon(s) and Role:    * Harvel Quale, MD - Primary  Patient underwent Egd and colonoscopy under propofol sedation.  The patient tolerated the procedure adequately.  Esophagogastroduodenospy showed presence of irregular Z-line.  Has 5 cm hiatal hernia with multiple Cameron lesions were found without any active bleeding.  Rest of the stomach was normal.  Duodenum was normal and was biopsied to rule out any duodenopathy's.olonoscopy had a fair prep. There was presence of a 15 mm polyp in the ascending colon that was removed with a hot snare.  6 other polyps between 3 to 8 mm were removed from the transverse and ascending colon with cold snare.  There was presence of small internal hemorrhoids.  RECOMMENDATIONS: - Return patient to hospital ward for ongoing care.  - Continue pantoprzole 40 mg twice a day. - Resume previous diet.  - Await pathology results.  - Repeat colonoscopy within 3 months because the bowel preparation was poor -  will need a two day prep  Maylon Peppers, MD Gastroenterology and Hepatology [facility]

## 2020-09-20 NOTE — Progress Notes (Addendum)
PROGRESS NOTE    Krystal Ali  VQQ:[id number] DOB: Aug 25, 1945 DOA: 09/18/2020 PCP: Caprice Renshaw, MD    Chief Complaint  Patient presents with   Rectal Bleeding    Brief Narrative:  As per H&P written by Dr. Sidney Ace on 09/18/20 Krystal Ali is a 75 y.o. female with medical history significant for type 2 diabetes mellitus, GERD, hypertension, dyslipidemia, depression and anxiety, overactive bladder, lumbar stenosis and urinary retention, who presented to the emergency room with acute onset of suspected rectal bleeding versus hematuria versus vaginal bleeding.  The patient has been having intermittent hematuria for the last several weeks.  The patient was seen here on 7/1 and treated for UTI with p.o. Bactrim.  Urine culture then grew E. coli that had MIC less than 24 Bactrim and 44 Proteus mirabilis.  Both bacteria had MIC less than 0.254 ceftriaxone.  She has been having continued dysuria, urinary frequency and urgency with hematuria.  She denies any nausea or vomiting but has been having lower abdominal discomfort and malaise.  She is recovering from back surgery 7 weeks ago and is currently at rehab.  She has some residual low back pain that radiates to both flanks but it has been getting better.  She denies any headache or dizziness or blurred vision.  No chest pain or dyspnea or palpitations, cough or wheezing or hemoptysis.  No dyspnea on exertion.  No other bleeding diathesis.  ED Course: Upon position to the emergency room, blood pressure was 108/46 with a heart rate of 93 and respiratory to 16.  Temperature was 98.1 and pulse ox 91% on room air.  Later respiratory rate was up to 22-24 with pulse oximetry of 97% on room air.Marland Kitchen  Urinalysis was positive for UTI with many bacteria, more than 50 RBCs and 21-50 WBCs.  CMP was remarkable for hyponatremia of 128 and blood glucose of 167 with a BUN of 27 and alk phos of 151 with albumin of 2.5 and total protein 6.6.  CBC showed WBC of 7.7 hemoglobin  8.9 hematocrit 27.8 with platelets of 496.  Her CBC on 7/1 showed hemoglobin of 13 and hematocrit 39.5 with platelets of 378 however on 6/11 hemoglobin was 9 and hematocrit 26.1 with platelets of 181.  The patient's stool Hemoccult came back positive.  Assessment & Plan: 1-acute blood loss anemia secondary to hematuria and GI bleed -GI service on board, will follow rec's; planning for EGD and colonoscopy on 7/13 -No major abnormalities appreciated on endoscopy; colonoscopy without overt bleeding seen but poor preparation.  GI recommending repeat colonoscopy in about 3 weeks. -Continue PPI twice a day and advance diet. -Continue avoiding heparin products and NSAID's -Overnight patient hemoglobin drop below 7 and she was transfused 1 unit of blood; repeat H&H twice demonstrating hemoglobin of 10.6. -Continue to follow hemoglobin trend -Continue to follow recommendations and further intervention by urology service.  2-sepsis due to UTI -follow culture results -continue current antibiotics. -Patient is currently afebrile.  3-hyponatremia -in the setting of poor oral intake and dehydration -will continue IVF's and follow electrolytes trend  4-type 2 diabetes with neuropathy -continue SSI -Follow CBGs and adjust hypoglycemic regimen as needed.  5-depression and anxiety -no SI or hallucinations -continue effexor  6-overreactive bladder  -continue myrbetriq  7-hemorrhagic cystitis  -urology consulted, and once again discussed today with Dr. Alinda Money who recommended placement of at least 18-20 French Foley catheter and that they will see patient in consultation. -She might end up requiring a cystoscopy. -continue  current IV antibiotics -follow culture results. -Continue supportive care.  DVT prophylaxis: scd's Code Status: full code. Family Communication: daughter updated over the phone on 09/20/2020 Disposition:   Status is: Inpatient  Remains inpatient appropriate because:IV  treatments appropriate due to intensity of illness or inability to take PO  Dispo: The patient is from: SNF              Anticipated d/c is to: SNF              Patient currently is not medically stable to d/c.   Difficult to place patient No       Consultants:  Urology GI service.  Procedures:  See below for x-ray reports.  Antimicrobials:  Rocephin    Subjective: Continue to experience frank hematuria; complaining of back pain and lower abdominal discomfort.  No overt bloody stools reported.  Objective: Vitals:   09/20/20 1315 09/20/20 1452 09/20/20 1459 09/20/20 1526  BP: 140/81 119/65 130/68 139/60  Pulse: 82 73 68 (!) 58  Resp: 18 (!) _0 Temp:  97.8 F (36.6 C)    TempSrc:  Oral    SpO2: 96% 97% 98% 99%  Weight:      Height:        Intake/Output Summary (Last 24 hours) at 09/20/2020 1736 Last data filed at 09/20/2020 1449 Gross per 24 hour  Intake 1709 ml  Output --  Net 1709 ml   Filed Weights   09/18/20 1416 09/18/20 2204  Weight: 68 kg 63.8 kg    Examination: General exam: Alert, awake, oriented x 3; no CP, no vomiting, no SOB. Patient continued to have hematuria and expressed lower abdominal pain. Respiratory system: Clear to auscultation. Respiratory effort normal.  No requiring oxygen supplementation. Cardiovascular system:RRR. No rubs, no gallops, no JVD. Gastrointestinal system: Abdomen is mildly to moderate tender to palpation in the lower quadrant; no guarding, positive bowel sounds, no distention. Central nervous system: Alert and oriented. No focal neurological deficits. Extremities: No cyanosis or clubbing, no lower extremity edema. Skin: No petechiae. Psychiatry: Judgement and insight appear normal. Mood & affect appropriate.    Data Reviewed: I have personally reviewed following labs and imaging studies  CBC: Recent Labs  Lab 09/18/20 1454 09/18/20 2116 09/19/20 0324 09/19/20 0906 09/19/20 1504 09/19/20 2037  09/20/20 0607 09/20/20 0901 09/20/20 1608  WBC 7.7  --  5.4  --   --   --   --   --  5.2  HGB 8.9*   < > 8.1*   < > 7.2* 6.9* 10.5* 10.8* 10.7*  HCT 27.8*   < > 25.2*   < > 22.8* 21.6* 32.2* 32.2* 33.4*  MCV 95.5  --  95.8  --   --   --   --   --  97.1  PLT 496*  --  475*  --   --   --   --   --  342   < > = values in this interval not displayed.    Basic Metabolic Panel: Recent Labs  Lab 09/18/20 1454 09/19/20 0324 09/20/20 0607  NA 128* 131* 132*  K 4.5 4.4 4.8  CL 99 102 103  CO2 _1 GLUCOSE 167* 82 90  BUN 27* 25* 14  CREATININE 0.81 0.59 0.51  CALCIUM 8.3* 8.5* 8.0*    GFR: Estimated Creatinine Clearance: 55.5 mL/min (by C-G formula based on SCr of 0.51 mg/dL).  Liver Function Tests: Recent Labs  Lab 09/18/20  1454  AST 29  ALT 24  ALKPHOS 151*  BILITOT 0.1*  PROT 6.6  ALBUMIN 2.5*    CBG: Recent Labs  Lab 09/19/20 2143 09/20/20 0712 09/20/20 1112 09/20/20 1239 09/20/20 1617  GLUCAP 86 97 98 88 73    Recent Results (from the past 240 hour(s))  Urine culture     Status: None   Collection Time: 09/18/20  4:15 PM   Specimen: Urine, Catheterized  Result Value Ref Range Status   Specimen Description   Final    URINE, CATHETERIZED Performed at [facility], [address] [place], [place] [address]    Special Requests   Final    NONE Performed at [facility], [address], [place], [place] [address]    Culture   Final    NO GROWTH Performed at [facility], [place] [address] [place], [place] [address]    Report Status 09/20/2020 FINAL  Final  MRSA Next Gen by PCR, Nasal     Status: Abnormal   Collection Time: 09/18/20 11:30 PM   Specimen: Nasal Mucosa; Nasal Swab  Result Value Ref Range Status   MRSA by PCR Next Gen DETECTED (A) NOT DETECTED Final    Comment: RESULT CALLED TO, READ BACK BY AND VERIFIED WITH:  DEAN,TRISHA @ 0803 09/19/20 BY STEPTR (NOTE) The GeneXpert MRSA Assay (FDA approved for NASAL specimens only), is one  component of a comprehensive MRSA colonization surveillance program. It is not intended to diagnose MRSA infection nor to guide or monitor treatment for MRSA infections. Test performance is not FDA approved in patients less than 16 years old. Performed at [facility], [address] [place],  [address]       Radiology Studies: No results found.   Scheduled Meds:  Chlorhexidine Gluconate Cloth  6 each Topical Q0600   gabapentin  100 mg Oral QHS   insulin aspart  0-9 Units Subcutaneous TID AC & HS   mirabegron ER  50 mg Oral Daily   mupirocin ointment  1 application Nasal BID   venlafaxine XR  150 mg Oral Q breakfast   Continuous Infusions:  0.9 % NaCl with KCl 20 mEq / L Stopped (09/19/20 2235)   cefTRIAXone (ROCEPHIN)  IV 1 g (09/20/20 1733)     LOS: 2 days    Time spent: 35 minutes.   Barton Dubois, MD [facility]   To contact the attending provider between 7A-7P or the covering provider during after hours 7P-7A, please log into the web site [email or web address] and access using universal [place] password for that web site. If you do not have the password, please call the hospital operator.  09/20/2020, 5:36 PM

## 2020-09-20 NOTE — Anesthesia Preprocedure Evaluation (Signed)
Anesthesia Evaluation  Patient identified by MRN, date of birth, ID band Patient awake    Reviewed: Allergy & Precautions, H&P , NPO status , Patient's Chart, lab work & pertinent test results, reviewed documented beta blocker date and time   Airway Mallampati: II  TM Distance: >3 FB Neck ROM: full    Dental no notable dental hx.    Pulmonary neg pulmonary ROS,    Pulmonary exam normal breath sounds clear to auscultation       Cardiovascular Exercise Tolerance: Good hypertension, negative cardio ROS   Rhythm:regular Rate:Normal     Neuro/Psych PSYCHIATRIC DISORDERS Anxiety Depression  Neuromuscular disease    GI/Hepatic Neg liver ROS, GERD  Medicated,  Endo/Other  negative endocrine ROSdiabetes  Renal/GU negative Renal ROS  negative genitourinary   Musculoskeletal   Abdominal   Peds  Hematology  (+) Blood dyscrasia, anemia ,   Anesthesia Other Findings   Reproductive/Obstetrics negative OB ROS                             Anesthesia Physical Anesthesia Plan  ASA: 2  Anesthesia Plan: General   Post-op Pain Management:    Induction:   PONV Risk Score and Plan: Propofol infusion  Airway Management Planned:   Additional Equipment:   Intra-op Plan:   Post-operative Plan:   Informed Consent: I have reviewed the patients History and Physical, chart, labs and discussed the procedure including the risks, benefits and alternatives for the proposed anesthesia with the patient or authorized representative who has indicated his/her understanding and acceptance.     Dental Advisory Given  Plan Discussed with: CRNA  Anesthesia Plan Comments:         Anesthesia Quick Evaluation

## 2020-09-20 NOTE — Progress Notes (Signed)
Reviewed chart and saw patient briefly this morning.  Hemoglobin declined to 6.9 yesterday evening.  She received an additional 1 unit PRBCs overnight with hemoglobin 10.5 this morning.  States she completed her bowel prep though she did have some trouble with this as it caused nausea.  Patient reports her output was clear and nursing staff confirmed this.  She did receive both enemas this morning with mostly clear output.  We will plan to proceed with colonoscopy and possible EGD today as planned to further evaluate anemia, heme positive stool, early satiety, weight loss.   Aliene Altes, PA-C [facility]

## 2020-09-20 NOTE — Op Note (Addendum)
[facility] Patient Name: Krystal Ali Procedure Date: 09/20/2020 1:49 PM MRN: [id number] Date of Birth: 1945-09-09 Attending MD: Maylon Peppers ,  CSN: [id number] Age: 75 Admit Type: Inpatient Procedure:                Colonoscopy Indications:              Hematochezia, Iron deficiency anemia Providers:                Maylon Peppers, Caprice Kluver, Kristine L. Risa Grill, Technician Referring MD:              Medicines:                Monitored Anesthesia Care Complications:            No immediate complications. Estimated Blood Loss:     Estimated blood loss: none. Procedure:                Pre-Anesthesia Assessment:                           - Prior to the procedure, a History and Physical                            was performed, and patient medications, allergies                            and sensitivities were reviewed. The patient's                            tolerance of previous anesthesia was reviewed.                           - The risks and benefits of the procedure and the                            sedation options and risks were discussed with the                            patient. All questions were answered and informed                            consent was obtained.                           - ASA Grade Assessment: III - A patient with severe                            systemic disease.                           After obtaining informed consent, the colonoscope                            was passed under direct vision. Throughout the  procedure, the patient's blood pressure, pulse, and                            oxygen saturations were monitored continuously. The                            [id number]-[id number] ([id number]) scope was introduced through                            the anus and advanced to the the cecum, identified                            by appendiceal orifice and ileocecal valve. The                             colonoscopy was performed without difficulty. The                            patient tolerated the procedure well. The quality                            of the bowel preparation was fair. Scope In: 1:52:28 PM Scope Out: 2:47:40 PM Scope Withdrawal Time: 0 hours 35 minutes 37 seconds  Total Procedure Duration: 0 hours 55 minutes 12 seconds  Findings:      The perianal and digital rectal examinations were normal.      A 15 mm polyp was found in the ascending colon. The polyp was       semi-sessile. The polyp was removed with a hot snare. Resection and       retrieval were complete. To prevent bleeding after the polypectomy, two       hemostatic clips were successfully placed. There was no bleeding at the       end of the procedure.      Six sessile polyps were found in the transverse colon and ascending       colon. The polyps were 3 to 8 mm in size. These polyps were removed with       a cold snare. Resection and retrieval were complete.      Non-bleeding internal hemorrhoids were found during retroflexion. The       hemorrhoids were small. Impression:               - Preparation of the colon was fair.                           - One 15 mm polyp in the ascending colon, removed                            with a hot snare. Resected and retrieved. Clips                            were placed.                           - Six 3 to 8 mm polyps in the transverse colon and  in the ascending colon, removed with a cold snare.                            Resected and retrieved.                           - Non-bleeding internal hemorrhoids. Moderate Sedation:      Per Anesthesia Care Recommendation:           - Return patient to hospital ward for ongoing care.                           - Resume previous diet.                           - Await pathology results.                           - Repeat colonoscopy within 3 months because the                             bowel preparation was poor - will need a two day                            prep Procedure Code(s):        --- Professional ---                           [phone], Colonoscopy, flexible; with removal of                            tumor(s), polyp(s), or other lesion(s) by snare                            technique Diagnosis Code(s):        --- Professional ---                           K64.8, Other hemorrhoids                           K63.5, Polyp of colon                           K92.1, Melena (includes Hematochezia)                           D50.9, Iron deficiency anemia, unspecified CPT copyright 2019 [employer]. All rights reserved. The codes documented in this report are preliminary and upon coder review may  be revised to meet current compliance requirements. Maylon Peppers, MD Maylon Peppers,  09/20/2020 2:53:26 PM This report has been signed electronically. Number of Addenda: 0

## 2020-09-20 NOTE — Progress Notes (Signed)
We will proceed with EGD and colonoscopy as scheduled.  I thoroughly discussed with the patient his procedure, including the risks involved. Patient understands what the procedure involves including the benefits and any risks. Patient understands alternatives to the proposed procedure. Risks including (but not limited to) bleeding, tearing of the lining (perforation), rupture of adjacent organs, problems with heart and lung function, infection, and medication reactions. A small percentage of complications may require surgery, hospitalization, repeat endoscopic procedure, and/or transfusion.  Patient understood and agreed. ? ?Charisse Wendell Castaneda, MD ?Gastroenterology and Hepatology ?[facility] ? ?

## 2020-09-21 ENCOUNTER — Inpatient Hospital Stay (HOSPITAL_COMMUNITY): Payer: Medicare Other

## 2020-09-21 DIAGNOSIS — R300 Dysuria: Secondary | ICD-10-CM

## 2020-09-21 DIAGNOSIS — E119 Type 2 diabetes mellitus without complications: Secondary | ICD-10-CM

## 2020-09-21 DIAGNOSIS — I10 Essential (primary) hypertension: Secondary | ICD-10-CM

## 2020-09-21 DIAGNOSIS — K922 Gastrointestinal hemorrhage, unspecified: Secondary | ICD-10-CM

## 2020-09-21 DIAGNOSIS — K625 Hemorrhage of anus and rectum: Secondary | ICD-10-CM

## 2020-09-21 DIAGNOSIS — D649 Anemia, unspecified: Secondary | ICD-10-CM | POA: Diagnosis not present

## 2020-09-21 DIAGNOSIS — R310 Gross hematuria: Secondary | ICD-10-CM

## 2020-09-21 LAB — BPAM RBC
Blood Product Expiration Date: 202208132359
Blood Product Expiration Date: 202208132359
ISSUE DATE / TIME: 202207122248
ISSUE DATE / TIME: 202207130214
Unit Type and Rh: 6200
Unit Type and Rh: 6200

## 2020-09-21 LAB — BASIC METABOLIC PANEL
Anion gap: 4 — ABNORMAL LOW (ref 5–15)
BUN: 11 mg/dL (ref 8–23)
CO2: 25 mmol/L (ref 22–32)
Calcium: 7.8 mg/dL — ABNORMAL LOW (ref 8.9–10.3)
Chloride: 104 mmol/L (ref 98–111)
Creatinine, Ser: 0.52 mg/dL (ref 0.44–1.00)
GFR, Estimated: >60 mL/min (ref >60)
Glucose, Bld: 83 mg/dL (ref 70–99)
Potassium: 3.7 mmol/L (ref 3.5–5.1)
Sodium: 133 mmol/L — ABNORMAL LOW (ref 135–145)

## 2020-09-21 LAB — CBC
HCT: 29 % — ABNORMAL LOW (ref 36.0–46.0)
Hemoglobin: 9.5 g/dL — ABNORMAL LOW (ref 12.0–15.0)
MCH: 30.4 pg (ref 26.0–34.0)
MCHC: 32.8 g/dL (ref 30.0–36.0)
MCV: 92.9 fL (ref 80.0–100.0)
Platelets: 301 10*3/uL (ref 150–400)
RBC: 3.12 MIL/uL — ABNORMAL LOW (ref 3.87–5.11)
RDW: 14.6 % (ref 11.5–15.5)
WBC: 5.1 10*3/uL (ref 4.0–10.5)
nRBC: 0 % (ref 0.0–0.2)

## 2020-09-21 LAB — GLUCOSE, CAPILLARY
Glucose-Capillary: 117 mg/dL — ABNORMAL HIGH (ref 70–99)
Glucose-Capillary: 83 mg/dL (ref 70–99)
Glucose-Capillary: 83 mg/dL (ref 70–99)
Glucose-Capillary: 122 mg/dL — ABNORMAL HIGH (ref 70–99)
Glucose-Capillary: 101 mg/dL — ABNORMAL HIGH (ref 70–99)

## 2020-09-21 LAB — HEMOGLOBIN AND HEMATOCRIT, BLOOD
HCT: 30.4 % — ABNORMAL LOW (ref 36.0–46.0)
HCT: 27.7 % — ABNORMAL LOW (ref 36.0–46.0)
Hemoglobin: 9.8 g/dL — ABNORMAL LOW (ref 12.0–15.0)
Hemoglobin: 9.2 g/dL — ABNORMAL LOW (ref 12.0–15.0)

## 2020-09-21 LAB — TYPE AND SCREEN
ABO/RH(D): A POS
Antibody Screen: NEGATIVE
Unit division: 0
Unit division: 0

## 2020-09-21 MED ORDER — IOHEXOL 300 MG/ML  SOLN
25.0000 mL | Freq: Once | INTRAMUSCULAR | Status: AC | PRN
  Administered 2020-09-21: 25 mL via INTRAVENOUS

## 2020-09-21 MED ORDER — IOHEXOL 300 MG/ML  SOLN: 100.0000 mL | Freq: Once | INTRAMUSCULAR | Status: DC | PRN

## 2020-09-21 NOTE — Consult Note (Signed)
Urology Consult  Referring physician: Dr. Denton Brick Reason for referral: Gross Hematuria  Chief Complaint: Gross Hematuria  History of Present Illness: Krystal Ali is a 75yo with a hx of DMII, HTN, and difficulty urinating who presented to the ER with rectal bleeding and gross hematuria. She had been having gross painless hematuria for several weeks prior to admission. She was having associated urinary urgency, frequency and dysuria. Urine culture grew E coli and Proteus sensitive to rocephin. She had a foley placed which continues to drain dark red urine. She denies any pelvic pain and she denies bladder spasms. She has a remote smoking history. No history of nephrolithiasis. No other associated symptoms. No other exacerbating/alleviating events. CT Hematuria protocol shows a thick bladder wall and thickened right renal pelvis consistent with infection, possible malignancy. She has changes to the ureterus and cervix of CT concerning for malignancy.  Past Medical History:  Diagnosis Date   Anxiety    Diabetes mellitus without complication (HCC)    GERD (gastroesophageal reflux disease)    Hyperlipidemia    Hypertension    Retention of urine, unspecified    Spinal stenosis of lumbar region without neurogenic claudication    Past Surgical History:  Procedure Laterality Date   APPENDECTOMY     BACK SURGERY  08/2020   BREAST SURGERY     CHOLECYSTECTOMY      Medications: I have reviewed the patient's current medications. Allergies:  Allergies  Allergen Reactions   Demerol [Meperidine Hcl]     aggitation   Morphine And Related     Family History  Problem Relation Age of Onset   Heart disease Father    COPD Brother    Social History:  reports that she has never smoked. She has never used smokeless tobacco. She reports previous alcohol use. She reports that she does not use drugs.  Review of Systems  Genitourinary:  Positive for difficulty urinating, dysuria, frequency, hematuria and  urgency.  All other systems reviewed and are negative.  Physical Exam:  Vital signs in last 24 hours: Temp:  [97.7 F (36.5 C)-98.2 F (36.8 C)] 98.2 F (36.8 C) (07/14 2038) Pulse Rate:  [71-83] 83 (07/14 2038) Resp:  [19-20] 19 (07/14 2038) BP: (125-154)/(53-63) 154/57 (07/14 2038) SpO2:  [97 %-98 %] 97 % (07/14 2038) Physical Exam Vitals reviewed.  Constitutional:      Appearance: Normal appearance.  HENT:     Head: Normocephalic and atraumatic.     Nose: Nose normal. No congestion.  Eyes:     Extraocular Movements: Extraocular movements intact.     Pupils: Pupils are equal, round, and reactive to light.  Cardiovascular:     Rate and Rhythm: Normal rate and regular rhythm.  Pulmonary:     Effort: Pulmonary effort is normal. No respiratory distress.  Abdominal:     General: Abdomen is flat. There is no distension.  Musculoskeletal:        General: Normal range of motion.     Cervical back: Normal range of motion. No rigidity.  Skin:    General: Skin is warm and dry.  Neurological:     General: No focal deficit present.     Mental Status: She is alert and oriented to person, place, and time.  Psychiatric:        Mood and Affect: Mood normal.        Behavior: Behavior normal.        Thought Content: Thought content normal.  Judgment: Judgment normal.    Laboratory Data:  Results for orders placed or performed during the hospital encounter of 09/18/20 (from the past 72 hour(s))  Hemoglobin and hematocrit, blood     Status: Abnormal   Collection Time: 09/18/20  9:16 PM  Result Value Ref Range   Hemoglobin 8.1 (L) 12.0 - 15.0 g/dL   HCT 25.3 (L) 36.0 - 46.0 %    Comment: Performed at [facility], [address] [place], [place] [address]  Hemoglobin A1c     Status: None   Collection Time: 09/18/20  9:16 PM  Result Value Ref Range   Hgb A1c MFr Bld 5.5 4.8 - 5.6 %    Comment: (NOTE) Pre diabetes:          5.7%-6.4%  Diabetes:               >6.4%  Glycemic control for   <7.0% adults with diabetes    Mean Plasma Glucose 111.15 mg/dL    Comment: Performed at [facility], [place] [address], [place], [place] [address]  Glucose, capillary     Status: None   Collection Time: 09/18/20 10:49 PM  Result Value Ref Range   Glucose-Capillary 94 70 - 99 mg/dL    Comment: Glucose reference range applies only to samples taken after fasting for at least 8 hours.   Comment 1 Notify RN    Comment 2 Document in Chart   MRSA Next Gen by PCR, Nasal     Status: Abnormal   Collection Time: 09/18/20 11:30 PM   Specimen: Nasal Mucosa; Nasal Swab  Result Value Ref Range   MRSA by PCR Next Gen DETECTED (A) NOT DETECTED    Comment: RESULT CALLED TO, READ BACK BY AND VERIFIED WITH:  DEAN,TRISHA @ 0803 09/19/20 BY STEPTR (NOTE) The GeneXpert MRSA Assay (FDA approved for NASAL specimens only), is one component of a comprehensive MRSA colonization surveillance program. It is not intended to diagnose MRSA infection nor to guide or monitor treatment for MRSA infections. Test performance is not FDA approved in patients less than 94 years old. Performed at [facility], [address], [place], [place] [address]   Protime-INR     Status: None   Collection Time: 09/19/20  3:24 AM  Result Value Ref Range   Prothrombin Time 14.7 11.4 - 15.2 seconds   INR 1.1 0.8 - 1.2    Comment: (NOTE) INR goal varies based on device and disease states. Performed at [facility], [address], [place], [place] [address]   Cortisol-am, blood     Status: None   Collection Time: 09/19/20  3:24 AM  Result Value Ref Range   Cortisol - AM 12.9 6.7 - 22.6 ug/dL    Comment: Performed at [facility] [facility] [address], [place], [place] [address]  Procalcitonin     Status: None   Collection Time: 09/19/20  3:24 AM  Result Value Ref Range   Procalcitonin <0.10 ng/mL    Comment:        Interpretation: PCT (Procalcitonin) <= 0.5 ng/mL: Systemic infection (sepsis)  is not likely. Local bacterial infection is possible. (NOTE)       Sepsis PCT Algorithm           Lower Respiratory Tract  Infection PCT Algorithm    ----------------------------     ----------------------------         PCT < 0.25 ng/mL                PCT < 0.10 ng/mL          Strongly encourage             Strongly discourage   discontinuation of antibiotics    initiation of antibiotics    ----------------------------     -----------------------------       PCT 0.25 - 0.50 ng/mL            PCT 0.10 - 0.25 ng/mL               OR       >80% decrease in PCT            Discourage initiation of                                            antibiotics      Encourage discontinuation           of antibiotics    ----------------------------     -----------------------------         PCT >= 0.50 ng/mL              PCT 0.26 - 0.50 ng/mL               AND        <80% decrease in PCT             Encourage initiation of                                             antibiotics       Encourage continuation           of antibiotics    ----------------------------     -----------------------------        PCT >= 0.50 ng/mL                  PCT > 0.50 ng/mL               AND         increase in PCT                  Strongly encourage                                      initiation of antibiotics    Strongly encourage escalation           of antibiotics                                     -----------------------------                                           PCT <= 0.25 ng/mL  OR                                        > 80% decrease in PCT                                      Discontinue / Do not initiate                                             antibiotics  Performed at [facility] [place], [place] [address]   Basic metabolic panel     Status: Abnormal   Collection Time: 09/19/20  3:24 AM   Result Value Ref Range   Sodium 131 (L) 135 - 145 mmol/L   Potassium 4.4 3.5 - 5.1 mmol/L   Chloride 102 98 - 111 mmol/L   CO2 23 22 - 32 mmol/L   Glucose, Bld 82 70 - 99 mg/dL    Comment: Glucose reference range applies only to samples taken after fasting for at least 8 hours.   BUN 25 (H) 8 - 23 mg/dL   Creatinine, Ser 0.59 0.44 - 1.00 mg/dL   Calcium 8.5 (L) 8.9 - 10.3 mg/dL   GFR, Estimated >60 >60 mL/min    Comment: (NOTE) Calculated using the CKD-EPI Creatinine Equation (2021)    Anion gap 6 5 - 15    Comment: Performed at [facility], [address], [place], [place] [address]  CBC     Status: Abnormal   Collection Time: 09/19/20  3:24 AM  Result Value Ref Range   WBC 5.4 4.0 - 10.5 K/uL   RBC 2.63 (L) 3.87 - 5.11 MIL/uL   Hemoglobin 8.1 (L) 12.0 - 15.0 g/dL   HCT 25.2 (L) 36.0 - 46.0 %   MCV 95.8 80.0 - 100.0 fL   MCH 30.8 26.0 - 34.0 pg   MCHC 32.1 30.0 - 36.0 g/dL   RDW 14.0 11.5 - 15.5 %   Platelets 475 (H) 150 - 400 K/uL   nRBC 0.0 0.0 - 0.2 %    Comment: Performed at [facility], [address], [place], [place] [address]  Glucose, capillary     Status: None   Collection Time: 09/19/20  7:56 AM  Result Value Ref Range   Glucose-Capillary 96 70 - 99 mg/dL    Comment: Glucose reference range applies only to samples taken after fasting for at least 8 hours.  Hemoglobin and hematocrit, blood     Status: Abnormal   Collection Time: 09/19/20  9:06 AM  Result Value Ref Range   Hemoglobin 7.7 (L) 12.0 - 15.0 g/dL   HCT 24.5 (L) 36.0 - 46.0 %    Comment: Performed at [facility], [address], [place], [place] [address]  Glucose, capillary     Status: None   Collection Time: 09/19/20 11:27 AM  Result Value Ref Range   Glucose-Capillary 82 70 - 99 mg/dL    Comment: Glucose reference range applies only to samples taken after fasting for at least 8 hours.  Hemoglobin and hematocrit, blood     Status: Abnormal   Collection Time: 09/19/20  3:04 PM  Result Value Ref  Range  Hemoglobin 7.2 (L) 12.0 - 15.0 g/dL   HCT 22.8 (L) 36.0 - 46.0 %    Comment: Performed at [facility], [address], [place], [place] [address]  Glucose, capillary     Status: None   Collection Time: 09/19/20  4:20 PM  Result Value Ref Range   Glucose-Capillary 85 70 - 99 mg/dL    Comment: Glucose reference range applies only to samples taken after fasting for at least 8 hours.  Hemoglobin and hematocrit, blood     Status: Abnormal   Collection Time: 09/19/20  8:37 PM  Result Value Ref Range   Hemoglobin 6.9 (LL) 12.0 - 15.0 g/dL    Comment: REPEATED TO VERIFY THIS CRITICAL RESULT HAS VERIFIED AND BEEN CALLED TO RASOAWSKI,I BY JAMIE WOODIE ON 07 12 2022 AT 2052, AND HAS BEEN READ BACK.     HCT 21.6 (L) 36.0 - 46.0 %    Comment: Performed at [facility], [address], [place], [place] [address]  Glucose, capillary     Status: Abnormal   Collection Time: 09/19/20  9:02 PM  Result Value Ref Range   Glucose-Capillary 69 (L) 70 - 99 mg/dL    Comment: Glucose reference range applies only to samples taken after fasting for at least 8 hours.   Comment 1 Notify RN    Comment 2 Document in Chart   ABO/Rh     Status: None   Collection Time: 09/19/20  9:26 PM  Result Value Ref Range   ABO/RH(D)      A POS Performed at [facility], [address] [place], [place] [address]   Glucose, capillary     Status: None   Collection Time: 09/19/20  9:43 PM  Result Value Ref Range   Glucose-Capillary 86 70 - 99 mg/dL    Comment: Glucose reference range applies only to samples taken after fasting for at least 8 hours.   Comment 1 Notify RN    Comment 2 Document in Chart   Glucose, capillary     Status: None   Collection Time: 09/19/20 11:44 PM  Result Value Ref Range   Glucose-Capillary 83 70 - 99 mg/dL    Comment: Glucose reference range applies only to samples taken after fasting for at least 8 hours.  Hemoglobin and hematocrit, blood     Status: Abnormal   Collection Time:  09/20/20  6:07 AM  Result Value Ref Range   Hemoglobin 10.5 (L) 12.0 - 15.0 g/dL    Comment: REPEATED TO VERIFY POST TRANSFUSION SPECIMEN    HCT 32.2 (L) 36.0 - 46.0 %    Comment: Performed at [facility], [address], [place], [place] [address]  Basic metabolic panel     Status: Abnormal   Collection Time: 09/20/20  6:07 AM  Result Value Ref Range   Sodium 132 (L) 135 - 145 mmol/L   Potassium 4.8 3.5 - 5.1 mmol/L   Chloride 103 98 - 111 mmol/L   CO2 23 22 - 32 mmol/L   Glucose, Bld 90 70 - 99 mg/dL    Comment: Glucose reference range applies only to samples taken after fasting for at least 8 hours.   BUN 14 8 - 23 mg/dL   Creatinine, Ser 0.51 0.44 - 1.00 mg/dL   Calcium 8.0 (L) 8.9 - 10.3 mg/dL   GFR, Estimated >60 >60 mL/min    Comment: (NOTE) Calculated using the CKD-EPI Creatinine Equation (2021)    Anion gap 6 5 - 15  Comment: Performed at [facility], [address] [place], [place] [address]  Glucose, capillary     Status: None   Collection Time: 09/20/20  7:12 AM  Result Value Ref Range   Glucose-Capillary 97 70 - 99 mg/dL    Comment: Glucose reference range applies only to samples taken after fasting for at least 8 hours.  Hemoglobin and hematocrit, blood     Status: Abnormal   Collection Time: 09/20/20  9:01 AM  Result Value Ref Range   Hemoglobin 10.8 (L) 12.0 - 15.0 g/dL   HCT 32.2 (L) 36.0 - 46.0 %    Comment: Performed at [facility], [address], [place], [place] [address]  Glucose, capillary     Status: None   Collection Time: 09/20/20 11:12 AM  Result Value Ref Range   Glucose-Capillary 98 70 - 99 mg/dL    Comment: Glucose reference range applies only to samples taken after fasting for at least 8 hours.  Glucose, capillary     Status: None   Collection Time: 09/20/20 12:39 PM  Result Value Ref Range   Glucose-Capillary 88 70 - 99 mg/dL    Comment: Glucose reference range applies only to samples taken after fasting for at least 8 hours.  CBC      Status: Abnormal   Collection Time: 09/20/20  4:08 PM  Result Value Ref Range   WBC 5.2 4.0 - 10.5 K/uL   RBC 3.44 (L) 3.87 - 5.11 MIL/uL   Hemoglobin 10.7 (L) 12.0 - 15.0 g/dL   HCT 33.4 (L) 36.0 - 46.0 %   MCV 97.1 80.0 - 100.0 fL   MCH 31.1 26.0 - 34.0 pg   MCHC 32.0 30.0 - 36.0 g/dL   RDW 14.7 11.5 - 15.5 %   Platelets 342 150 - 400 K/uL   nRBC 0.0 0.0 - 0.2 %    Comment: Performed at [facility], [address], [place], [place] [address]  Glucose, capillary     Status: None   Collection Time: 09/20/20  4:17 PM  Result Value Ref Range   Glucose-Capillary 73 70 - 99 mg/dL    Comment: Glucose reference range applies only to samples taken after fasting for at least 8 hours.  Glucose, capillary     Status: Abnormal   Collection Time: 09/20/20  8:36 PM  Result Value Ref Range   Glucose-Capillary 105 (H) 70 - 99 mg/dL    Comment: Glucose reference range applies only to samples taken after fasting for at least 8 hours.  Hemoglobin and hematocrit, blood     Status: Abnormal   Collection Time: 09/20/20  9:04 PM  Result Value Ref Range   Hemoglobin 10.6 (L) 12.0 - 15.0 g/dL   HCT 32.1 (L) 36.0 - 46.0 %    Comment: Performed at [facility], [address], [place], [place] [address]  Basic metabolic panel     Status: Abnormal   Collection Time: 09/21/20  4:56 AM  Result Value Ref Range   Sodium 133 (L) 135 - 145 mmol/L   Potassium 3.7 3.5 - 5.1 mmol/L    Comment: DELTA CHECK NOTED   Chloride 104 98 - 111 mmol/L   CO2 25 22 - 32 mmol/L   Glucose, Bld 83 70 - 99 mg/dL    Comment: Glucose reference range applies only to samples taken after fasting for at least 8 hours.   BUN 11 8 - 23 mg/dL   Creatinine, Ser 0.52 0.44 - 1.00 mg/dL  Calcium 7.8 (L) 8.9 - 10.3 mg/dL   GFR, Estimated >60 >60 mL/min    Comment: (NOTE) Calculated using the CKD-EPI Creatinine Equation (2021)    Anion gap 4 (L) 5 - 15    Comment: Performed at [facility], [address], [place], [place] [address]   CBC     Status: Abnormal   Collection Time: 09/21/20  4:56 AM  Result Value Ref Range   WBC 5.1 4.0 - 10.5 K/uL   RBC 3.12 (L) 3.87 - 5.11 MIL/uL   Hemoglobin 9.5 (L) 12.0 - 15.0 g/dL   HCT 29.0 (L) 36.0 - 46.0 %   MCV 92.9 80.0 - 100.0 fL   MCH 30.4 26.0 - 34.0 pg   MCHC 32.8 30.0 - 36.0 g/dL   RDW 14.6 11.5 - 15.5 %   Platelets 301 150 - 400 K/uL   nRBC 0.0 0.0 - 0.2 %    Comment: Performed at [facility], [address], [place], [place] [address]  Glucose, capillary     Status: None   Collection Time: 09/21/20  7:08 AM  Result Value Ref Range   Glucose-Capillary 83 70 - 99 mg/dL    Comment: Glucose reference range applies only to samples taken after fasting for at least 8 hours.   Comment 1 QC Due   Hemoglobin and hematocrit, blood     Status: Abnormal   Collection Time: 09/21/20  8:53 AM  Result Value Ref Range   Hemoglobin 9.8 (L) 12.0 - 15.0 g/dL   HCT 30.4 (L) 36.0 - 46.0 %    Comment: Performed at [facility], [address] [place], [place] [address]  Glucose, capillary     Status: Abnormal   Collection Time: 09/21/20 11:06 AM  Result Value Ref Range   Glucose-Capillary 117 (H) 70 - 99 mg/dL    Comment: Glucose reference range applies only to samples taken after fasting for at least 8 hours.  Hemoglobin and hematocrit, blood     Status: Abnormal   Collection Time: 09/21/20  3:43 PM  Result Value Ref Range   Hemoglobin 9.2 (L) 12.0 - 15.0 g/dL   HCT 27.7 (L) 36.0 - 46.0 %    Comment: Performed at [facility], [address] [place], [place] [address]  Glucose, capillary     Status: Abnormal   Collection Time: 09/21/20  4:59 PM  Result Value Ref Range   Glucose-Capillary 122 (H) 70 - 99 mg/dL    Comment: Glucose reference range applies only to samples taken after fasting for at least 8 hours.  Glucose, capillary     Status: Abnormal   Collection Time: 09/21/20  8:37 PM  Result Value Ref Range   Glucose-Capillary 101 (H) 70 - 99 mg/dL    Comment: Glucose  reference range applies only to samples taken after fasting for at least 8 hours.   Recent Results (from the past 240 hour(s))  Urine culture     Status: None   Collection Time: 09/18/20  4:15 PM   Specimen: Urine, Catheterized  Result Value Ref Range Status   Specimen Description   Final    URINE, CATHETERIZED Performed at [facility], [address], [place], [place] [address]    Special Requests   Final    NONE Performed at [facility], [address] [place], [place] [address]    Culture   Final    NO GROWTH Performed at [facility], [place] [address] [place],  [address]  Report Status 09/20/2020 FINAL  Final  MRSA Next Gen by PCR, Nasal     Status: Abnormal   Collection Time: 09/18/20 11:30 PM   Specimen: Nasal Mucosa; Nasal Swab  Result Value Ref Range Status   MRSA by PCR Next Gen DETECTED (A) NOT DETECTED Final    Comment: RESULT CALLED TO, READ BACK BY AND VERIFIED WITH:  DEAN,TRISHA @ 0803 09/19/20 BY STEPTR (NOTE) The GeneXpert MRSA Assay (FDA approved for NASAL specimens only), is one component of a comprehensive MRSA colonization surveillance program. It is not intended to diagnose MRSA infection nor to guide or monitor treatment for MRSA infections. Test performance is not FDA approved in patients less than 29 years old. Performed at [facility], [address] [place], [place] [address]    Creatinine: Recent Labs    09/18/20 1454 09/19/20 0324 09/20/20 0607 09/21/20 0456  CREATININE 0.81 0.59 0.51 0.52   Baseline Creatinine: 0.5  Impression/Assessment:  75yo with gross hematuria  Plan:  Gross hematuria: I discussed the various causes of gross hematuria including infection/malignant processes. 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 Please consider pelvic US and Gynecology consult.        She should continue indwelling foley until her urine is clear. Since the foley is draining well she does  not require continuous bladder irrigation at this time. Urology to continue to follow  Nicolette Bang 09/21/2020, 8:43 PM

## 2020-09-21 NOTE — Progress Notes (Signed)
Subjective:  No complaints. Denies abdominal pain. Tolerated some of her breakfast, sausage and toast. States she only drank 1/2 the prep prior to her colonoscopy due to taste.   Objective: Vital signs in last 24 hours: Temp:  [97.8 F (36.6 C)-99.3 F (37.4 C)] 98.1 F (36.7 C) (07/14 0413) Pulse Rate:  [58-83] 71 (07/14 0413) Resp:  [12-26] 19 (07/14 0413) BP: (112-157)/(43-103) 125/53 (07/14 0413) SpO2:  [96 %-99 %] 98 % (07/14 0413) Last BM Date: 09/20/20 General:   Alert,  Well-developed, well-nourished, pleasant and cooperative in NAD Head:  Normocephalic and atraumatic. Eyes:  Sclera clear, no icterus.  Abdomen:  Soft, nontender and nondistended. Normal bowel sounds, without guarding, and without rebound.   Extremities:  Without clubbing, deformity or edema. Neurologic:  Alert and  oriented x4;  grossly normal neurologically. Skin:  Intact without significant lesions or rashes. Psych:  Alert and cooperative. Normal mood and affect.  Intake/Output from previous day: 07/13 0701 - 07/14 0700 In: 1240 [P.O.:240; I.V.:1000] Out: 1400 [Urine:1400] Intake/Output this shift: Total I/O In: 920 [P.O.:920] Out: -   Lab Results: CBC Recent Labs    09/19/20 0324 09/19/20 0906 09/20/20 1608 09/20/20 2104 09/21/20 0456 09/21/20 0853  WBC 5.4  --  5.2  --  5.1  --   HGB 8.1*   < > 10.7* 10.6* 9.5* 9.8*  HCT 25.2*   < > 33.4* 32.1* 29.0* 30.4*  MCV 95.8  --  97.1  --  92.9  --   PLT 475*  --  342  --  301  --    < > = values in this interval not displayed.   BMET Recent Labs    09/19/20 0324 09/20/20 0607 09/21/20 0456  NA 131* 132* 133*  K 4.4 4.8 3.7  CL 102 103 104  CO2 23 23 25   GLUCOSE 82 90 83  BUN 25* 14 11  CREATININE 0.59 0.51 0.52  CALCIUM 8.5* 8.0* 7.8*   LFTs Recent Labs    09/18/20 1454  BILITOT 0.1*  ALKPHOS 151*  AST 29  ALT 24  PROT 6.6  ALBUMIN 2.5*   No results for input(s): LIPASE in the last 72 hours. PT/INR Recent Labs     09/19/20 0324  LABPROT 14.7  INR 1.1      Imaging Studies: DG Chest Port 1 View  Result Date: 09/08/2020 CLINICAL DATA:  Hematuria. EXAM: PORTABLE CHEST 1 VIEW COMPARISON:  None. FINDINGS: The lungs are hyperinflated. Mild, diffuse, chronic appearing increased lung markings are seen. There is no evidence of focal consolidation, pleural effusion or pneumothorax. The cardiac silhouette is mildly enlarged. A large hiatal hernia is seen. A radiopaque fusion plate and screws are seen overlying the cervical spine with bilateral pedicle screws noted within the lower thoracic and upper lumbar spine. A chronic fracture of the mid right clavicle is seen. IMPRESSION: Chronic and postoperative changes without acute or active cardiopulmonary disease. Electronically Signed   By: Virgina Norfolk M.D.   On: 09/08/2020 19:21  [2 weeks]   Assessment: 75 year old female presented to the ED with ongoing hematuria after diagnosis of UTI on July 1, as well as ongoing bright red bleeding noted on her brief when cleaning herself for the past 6 weeks.  ED course remarkable for positive UTI on urinalysis as well as mild sepsis.  She was found to have heme positive stool with bleeding from perineal area coming from indeterminate source (vaginal versus rectal).  Patient also complained of early satiety,  diminished appetite with significant weight loss over the past 1 year.  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  Normal duodenum status post biopsy.  Colonoscopy July 13, bowel preparation fair. One 60mm polyp in the ascending colon removed. Six 3 to 8 mm polyps in transverse/ascending colon removed.  Nonbleeding internal hemorrhoids.  Recommend repeat colonoscopy in 3 months.  She has received 2 units of packed red blood cells this admission.  Hemoglobin on admission 8.9 with nadir of 6.9.  Currently 9.8.  No further overt bleeding noted.  Plan: Follow-up pathology as  available. Repeat colonoscopy in 3 months.  She will need 2-day bowel prep.  Laureen Ochs. Bernarda Caffey [facility] [phone] 7/14/202211:35 AM    LOS: 3 days

## 2020-09-21 NOTE — Progress Notes (Addendum)
PROGRESS NOTE    Krystal Ali  ZRA:[id number] DOB: [birth year] DOA: 09/18/2020 PCP: Caprice Renshaw, MD    Chief Complaint  Patient presents with   Rectal Bleeding    Brief Narrative:  As per H&P written by Dr. Sidney Ace on 09/18/20 Krystal Ali is a 75 y.o. female with medical history significant for type 2 diabetes mellitus, GERD, hypertension, dyslipidemia, depression and anxiety, overactive bladder, lumbar stenosis and urinary retention, who presented to the emergency room with acute onset of suspected rectal bleeding versus hematuria versus vaginal bleeding.  The patient has been having intermittent hematuria for the last several weeks.  The patient was seen here on 7/1 and treated for UTI with p.o. Bactrim.  Urine culture then grew E. coli that had MIC less than 24 Bactrim and 44 Proteus mirabilis.  Both bacteria had MIC less than 0.254 ceftriaxone.  She has been having continued dysuria, urinary frequency and urgency with hematuria.  She denies any nausea or vomiting but has been having lower abdominal discomfort and malaise.  She is recovering from back surgery 7 weeks ago and is currently at rehab.  She has some residual low back pain that radiates to both flanks but it has been getting better.  She denies any headache or dizziness or blurred vision.  No chest pain or dyspnea or palpitations, cough or wheezing or hemoptysis.  No dyspnea on exertion.  No other bleeding diathesis.  ED Course: Upon position to the emergency room, blood pressure was 108/46 with a heart rate of 93 and respiratory to 16.  Temperature was 98.1 and pulse ox 91% on room air.  Later respiratory rate was up to 22-24 with pulse oximetry of 97% on room air.Marland Kitchen  Urinalysis was positive for UTI with many bacteria, more than 50 RBCs and 21-50 WBCs.  CMP was remarkable for hyponatremia of 128 and blood glucose of 167 with a BUN of 27 and alk phos of 151 with albumin of 2.5 and total protein 6.6.  CBC showed WBC of 7.7 hemoglobin  8.9 hematocrit 27.8 with platelets of 496.  Her CBC on 7/1 showed hemoglobin of 13 and hematocrit 39.5 with platelets of 378 however on 6/11 hemoglobin was 9 and hematocrit 26.1 with platelets of 181.  The patient's stool Hemoccult came back positive.  Assessment & Plan: 1-acute blood loss anemia secondary to hematuria and GI bleed -GI service on board, will follow rec's; planning for EGD and colonoscopy on 7/13 -No major abnormalities appreciated on endoscopy; colonoscopy without overt bleeding seen but poor preparation.  GI recommending repeat colonoscopy in about 3 weeks. -Continue PPI twice a day and advance diet. -H&H relatively stable after transfusion of 1 unit of PRBC -GI input appreciated  2-sepsis due to UTI Recent Proteus mirabilis and E. Coli-based on culture from 09/08/2020  -Repeat urine culture from 09/18/2020 NGTD --Consider stopping Rocephin on 09/22/2020 if cultures still negative  3-hyponatremia -in the setting of poor oral intake and dehydration -Sodium is up to 133 -will continue IVF's and follow electrolytes trend  4-type 2 diabetes with neuropathy -continue SSI -Follow CBGs and adjust hypoglycemic regimen as needed.  5-depression and anxiety -no SI or hallucinations -continue effexor  6-overreactive bladder  -continue myrbetriq  7-hemorrhagic cystitis  -urology consulted, a previously discussed with Dr. Alinda Money who recommended placement of at least 18-20 French Foley catheter and that they will see patient in consultation. -She might end up requiring a cystoscopy. Currently on IV Rocephin -follow culture results. -Awaiting official urology consult  DVT prophylaxis: scd's Code Status: full code. Family Communication: daughter updated over the phone on 09/20/2020 Disposition:   Status is: Inpatient  Remains inpatient appropriate because:IV treatments appropriate due to intensity of illness or inability to take PO  Dispo: The patient is from: SNF               Anticipated d/c is to: SNF              Patient currently is not medically stable to d/c.   Difficult to place patient No       Consultants:  Urology GI service.  Procedures:  See below for x-ray reports.  Antimicrobials:  Rocephin    Subjective: -No fevers no chills -- flank pain persist, -No vomiting or diarrhea  Objective: Vitals:   09/20/20 1852 09/20/20 2037 09/21/20 0413 09/21/20 1525  BP: (!) 127/43 (!) 157/59 (!) 125/53 135/63  Pulse: 65 66 71 76  Resp: 18 18 19 20   Temp: 98.4 F (36.9 C) 98.1 F (36.7 C) 98.1 F (36.7 C) 97.7 F (36.5 C)  TempSrc: Oral   Oral  SpO2: 99% 97% 98% 97%  Weight:      Height:        Intake/Output Summary (Last 24 hours) at 09/21/2020 1757 Last data filed at 09/21/2020 1526 Gross per 24 hour  Intake 1240 ml  Output 2400 ml  Net -1160 ml   Filed Weights   09/18/20 1416 09/18/20 2204  Weight: 68 kg 63.8 kg    Examination: General exam: Alert, awake, oriented x 3;   Respiratory system: Clear to auscultation. Respiratory effort normal.  No requiring oxygen supplementation. Cardiovascular system:RRR. No rubs, no gallops, no JVD. Gastrointestinal system: Abdomen is mildly to moderate tender to palpation in the lower quadrant; no guarding, positive bowel sounds, no distention. Central nervous system: Alert and oriented. No focal neurological deficits. Extremities: No cyanosis or clubbing, no lower extremity edema. Skin: No petechiae. Psychiatry: Judgement and insight appear normal. Mood & affect appropriate.    Data Reviewed: I have personally reviewed following labs and imaging studies  CBC: Recent Labs  Lab 09/18/20 1454 09/18/20 2116 09/19/20 0324 09/19/20 0906 09/20/20 1608 09/20/20 2104 09/21/20 0456 09/21/20 0853 09/21/20 1543  WBC 7.7  --  5.4  --  5.2  --  5.1  --   --   HGB 8.9*   < > 8.1*   < > 10.7* 10.6* 9.5* 9.8* 9.2*  HCT 27.8*   < > 25.2*   < > 33.4* 32.1* 29.0* 30.4* 27.7*  MCV 95.5  --   95.8  --  97.1  --  92.9  --   --   PLT 496*  --  475*  --  342  --  301  --   --    < > = values in this interval not displayed.    Basic Metabolic Panel: Recent Labs  Lab 09/18/20 1454 09/19/20 0324 09/20/20 0607 09/21/20 0456  NA 128* 131* 132* 133*  K 4.5 4.4 4.8 3.7  CL 99 102 103 104  CO2 26 23 23 25   GLUCOSE 167* 82 90 83  BUN 27* 25* 14 11  CREATININE 0.81 0.59 0.51 0.52  CALCIUM 8.3* 8.5* 8.0* 7.8*    GFR: Estimated Creatinine Clearance: 55.5 mL/min (by C-G formula based on SCr of 0.52 mg/dL).  Liver Function Tests: Recent Labs  Lab 09/18/20 1454  AST 29  ALT 24  ALKPHOS 151*  BILITOT 0.1*  PROT 6.6  ALBUMIN 2.5*    CBG: Recent Labs  Lab 09/20/20 1617 09/20/20 2036 09/21/20 0708 09/21/20 1106 09/21/20 1659  GLUCAP 73 105* 83 117* 122*    Recent Results (from the past 240 hour(s))  Urine culture     Status: None   Collection Time: 09/18/20  4:15 PM   Specimen: Urine, Catheterized  Result Value Ref Range Status   Specimen Description   Final    URINE, CATHETERIZED Performed at [facility], [address] [place], [place] [address]    Special Requests   Final    NONE Performed at [facility], [address], [place], [place] [address]    Culture   Final    NO GROWTH Performed at [facility], [place] [address], [place], [place] [address]    Report Status 09/20/2020 FINAL  Final  MRSA Next Gen by PCR, Nasal     Status: Abnormal   Collection Time: 09/18/20 11:30 PM   Specimen: Nasal Mucosa; Nasal Swab  Result Value Ref Range Status   MRSA by PCR Next Gen DETECTED (A) NOT DETECTED Final    Comment: RESULT CALLED TO, READ BACK BY AND VERIFIED WITH:  DEAN,TRISHA @ 0803 09/19/20 BY STEPTR (NOTE) The GeneXpert MRSA Assay (FDA approved for NASAL specimens only), is one component of a comprehensive MRSA colonization surveillance program. It is not intended to diagnose MRSA infection nor to guide or monitor treatment for MRSA  infections. Test performance is not FDA approved in patients less than 15 years old. Performed at [facility], [address], [place], [place] [address]       Radiology Studies: No results found.   Scheduled Meds:  Chlorhexidine Gluconate Cloth  6 each Topical Q0600   gabapentin  100 mg Oral BID   insulin aspart  0-9 Units Subcutaneous TID AC & HS   mirabegron ER  50 mg Oral Daily   mupirocin ointment  1 application Nasal BID   venlafaxine XR  150 mg Oral Q breakfast   Continuous Infusions:  0.9 % NaCl with KCl 20 mEq / L 100 mL/hr at 09/21/20 1706   cefTRIAXone (ROCEPHIN)  IV 1 g (09/21/20 1707)     LOS: 3 days   Roxan Hockey, MD [facility]   To contact the attending provider between 7A-7P or the covering provider during after hours 7P-7A, please log into the web site [email or web address] and access using universal [place] password for that web site. If you do not have the password, please call the hospital operator.  09/21/2020, 5:57 PM

## 2020-09-22 ENCOUNTER — Inpatient Hospital Stay (HOSPITAL_COMMUNITY): Payer: Medicare Other

## 2020-09-22 LAB — SURGICAL PATHOLOGY

## 2020-09-22 LAB — CBC
HCT: 28.9 % — ABNORMAL LOW (ref 36.0–46.0)
Hemoglobin: 9.5 g/dL — ABNORMAL LOW (ref 12.0–15.0)
MCH: 30.4 pg (ref 26.0–34.0)
MCHC: 32.9 g/dL (ref 30.0–36.0)
MCV: 92.6 fL (ref 80.0–100.0)
Platelets: 287 10*3/uL (ref 150–400)
RBC: 3.12 MIL/uL — ABNORMAL LOW (ref 3.87–5.11)
RDW: 14.6 % (ref 11.5–15.5)
WBC: 4.2 10*3/uL (ref 4.0–10.5)
nRBC: 0 % (ref 0.0–0.2)

## 2020-09-22 LAB — BASIC METABOLIC PANEL
Anion gap: 5 (ref 5–15)
BUN: 11 mg/dL (ref 8–23)
CO2: 24 mmol/L (ref 22–32)
Calcium: 7.8 mg/dL — ABNORMAL LOW (ref 8.9–10.3)
Chloride: 104 mmol/L (ref 98–111)
Creatinine, Ser: 0.36 mg/dL — ABNORMAL LOW (ref 0.44–1.00)
GFR, Estimated: >60 mL/min (ref >60)
Glucose, Bld: 85 mg/dL (ref 70–99)
Potassium: 4 mmol/L (ref 3.5–5.1)
Sodium: 133 mmol/L — ABNORMAL LOW (ref 135–145)

## 2020-09-22 LAB — GLUCOSE, CAPILLARY
Glucose-Capillary: 98 mg/dL (ref 70–99)
Glucose-Capillary: 99 mg/dL (ref 70–99)
Glucose-Capillary: 71 mg/dL (ref 70–99)
Glucose-Capillary: 89 mg/dL (ref 70–99)

## 2020-09-22 NOTE — Care Management Important Message (Signed)
Important Message  Patient Details  Name: Krystal Ali MRN: [id number] Date of Birth: 1945-03-21   Medicare Important Message Given:  Yes     Tommy Medal 09/22/2020, 2:37 PM

## 2020-09-22 NOTE — Progress Notes (Addendum)
PROGRESS NOTE    Krystal Ali  ION:[id number] DOB: 03/31/1945 DOA: 09/18/2020 PCP: Caprice Renshaw, MD    Chief Complaint  Patient presents with   Rectal Bleeding    Brief Narrative:  As per H&P written by Dr. Sidney Ace on 09/18/20 Krystal Ali is a 75 y.o. female with medical history significant for type 2 diabetes mellitus, GERD, hypertension, dyslipidemia, depression and anxiety, overactive bladder, lumbar stenosis and urinary retention, who presented to the emergency room with acute onset of suspected rectal bleeding versus hematuria versus vaginal bleeding.  The patient has been having intermittent hematuria for the last several weeks.  The patient was seen here on 7/1 and treated for UTI with p.o. Bactrim.  Urine culture then grew E. coli that had MIC less than 24 Bactrim and 44 Proteus mirabilis.  Both bacteria had MIC less than 0.254 ceftriaxone.  She has been having continued dysuria, urinary frequency and urgency with hematuria.  She denies any nausea or vomiting but has been having lower abdominal discomfort and malaise.  She is recovering from back surgery 7 weeks ago and is currently at rehab.  She has some residual low back pain that radiates to both flanks but it has been getting better.  She denies any headache or dizziness or blurred vision.  No chest pain or dyspnea or palpitations, cough or wheezing or hemoptysis.  No dyspnea on exertion.  No other bleeding diathesis.  ED Course: Upon position to the emergency room, blood pressure was 108/46 with a heart rate of 93 and respiratory to 16.  Temperature was 98.1 and pulse ox 91% on room air.  Later respiratory rate was up to 22-24 with pulse oximetry of 97% on room air.Marland Kitchen  Urinalysis was positive for UTI with many bacteria, more than 50 RBCs and 21-50 WBCs.  CMP was remarkable for hyponatremia of 128 and blood glucose of 167 with a BUN of 27 and alk phos of 151 with albumin of 2.5 and total protein 6.6.  CBC showed WBC of 7.7 hemoglobin  8.9 hematocrit 27.8 with platelets of 496.  Her CBC on 7/1 showed hemoglobin of 13 and hematocrit 39.5 with platelets of 378 however on 6/11 hemoglobin was 9 and hematocrit 26.1 with platelets of 181.  The patient's stool Hemoccult came back positive.  Assessment & Plan: 1-acute blood loss anemia secondary to hematuria and GI bleed -GI service on board, will follow rec's; planning for EGD and colonoscopy on 7/13 -No major abnormalities appreciated on endoscopy; colonoscopy without overt bleeding seen but poor preparation.  GI recommending repeat colonoscopy in about 3 weeks. -Continue PPI twice a day and advance diet. -H&H relatively stable after transfusion of 1 unit of PRBC --hemoglobin currently above 9 -GI input appreciated  2-sepsis due to UTI Recent Proteus mirabilis and E. Coli-based on culture from 09/08/2020  -Repeat urine culture from 09/18/2020 NGTD -Okay to stop IV Rocephin last repeat urine cultures are negative  3-hyponatremia -in the setting of poor oral intake and dehydration -Sodium is up to 133 -will continue IVF's and follow electrolytes trend  4-type 2 diabetes with neuropathy -continue SSI -Follow CBGs and adjust hypoglycemic regimen as needed.  5-depression and anxiety -no SI or hallucinations -continue effexor  6-overreactive bladder  -continue myrbetriq  7-hemorrhagic cystitis  -urology consulted, a previously discussed with Dr. Alinda Money who recommended placement of at least 18-20 French Foley catheter and that they will see patient in consultation. -She might end up requiring a cystoscopy. -Urology consult appreciated okay to  remove Foley catheter per urologist  8)Possible Gyn Malignancy- Pelvic ultrasound with 28 mm endometrial thickness,  -CT abd shows--- Abnormal distension of the endometrial cavity suspicious for cervical or endometrial malignancy.  Given concerns about vaginal bleeding patient will need endometrial sampling to exclude malignancy -As  per Dr Elonda Husky  she will need formal hysteroscopy and uterine curettage .   DVT prophylaxis: scd's Code Status: full code. Family Communication: daughter updated over the phone on 09/20/2020 Disposition:   Status is: Inpatient  Remains inpatient appropriate because:IV treatments appropriate due to intensity of illness or inability to take PO  Dispo: The patient is from: SNF              Anticipated d/c is to: SNF              Patient currently is not medically stable to d/c.   Difficult to place patient No       Consultants:  Urology GI service.  Procedures:  See below for x-ray reports.  Antimicrobials:  Rocephin    Subjective: -Hematuria resolving, -Lower abdominal discomfort persist, -Denies bright red blood per rectum  Objective: Vitals:   09/21/20 2038 09/22/20 0427 09/22/20 0428 09/22/20 1342  BP: (!) 154/57 (!) 131/56  (!) 114/52  Pulse: 83 75 79 80  Resp: 19 19  18   Temp: 98.2 F (36.8 C) 98 F (36.7 C)  97.7 F (36.5 C)  TempSrc:    Oral  SpO2: 97% 99% 99% 99%  Weight:      Height:        Intake/Output Summary (Last 24 hours) at 09/22/2020 1729 Last data filed at 09/22/2020 1014 Gross per 24 hour  Intake --  Output 3200 ml  Net -3200 ml   Filed Weights   09/18/20 1416 09/18/20 2204  Weight: 68 kg 63.8 kg    Examination: General exam: Alert, awake, oriented x 3;   Respiratory system: Clear to auscultation. Respiratory effort normal.   Cardiovascular system:RRR. No rubs, no gallops, no JVD. Gastrointestinal system: Abdomen is mildly to moderate tender to palpation in the lower quadrant; no guarding, positive bowel sounds, no distention. Central nervous system: Alert and oriented. No focal neurological deficits. Extremities: No cyanosis or clubbing, no lower extremity edema. Skin: No petechiae. Psychiatry: Judgement and insight appear normal. Mood & affect appropriate.  GU- Foley to be taken out  Data Reviewed: I have personally reviewed  following labs and imaging studies  CBC: Recent Labs  Lab 09/18/20 1454 09/18/20 2116 09/19/20 0324 09/19/20 0906 09/20/20 1608 09/20/20 2104 09/21/20 0456 09/21/20 0853 09/21/20 1543 09/22/20 0556  WBC 7.7  --  5.4  --  5.2  --  5.1  --   --  4.2  HGB 8.9*   < > 8.1*   < > 10.7* 10.6* 9.5* 9.8* 9.2* 9.5*  HCT 27.8*   < > 25.2*   < > 33.4* 32.1* 29.0* 30.4* 27.7* 28.9*  MCV 95.5  --  95.8  --  97.1  --  92.9  --   --  92.6  PLT 496*  --  475*  --  342  --  301  --   --  287   < > = values in this interval not displayed.    Basic Metabolic Panel: Recent Labs  Lab 09/18/20 1454 09/19/20 0324 09/20/20 0607 09/21/20 0456 09/22/20 0556  NA 128* 131* 132* 133* 133*  K 4.5 4.4 4.8 3.7 4.0  CL 99 102 103 104 104  CO2 26 23 23 25 24   GLUCOSE 167* 82 90 83 85  BUN 27* 25* 14 11 11   CREATININE 0.81 0.59 0.51 0.52 0.36*  CALCIUM 8.3* 8.5* 8.0* 7.8* 7.8*    GFR: Estimated Creatinine Clearance: 55.5 mL/min (A) (by C-G formula based on SCr of 0.36 mg/dL (L)).  Liver Function Tests: Recent Labs  Lab 09/18/20 1454  AST 29  ALT 24  ALKPHOS 151*  BILITOT 0.1*  PROT 6.6  ALBUMIN 2.5*    CBG: Recent Labs  Lab 09/21/20 1659 09/21/20 2037 09/22/20 0716 09/22/20 1117 09/22/20 1649  GLUCAP 122* 101* 98 99 71    Recent Results (from the past 240 hour(s))  Urine culture     Status: None   Collection Time: 09/18/20  4:15 PM   Specimen: Urine, Catheterized  Result Value Ref Range Status   Specimen Description   Final    URINE, CATHETERIZED Performed at [facility], [address] [place], [place] [address]    Special Requests   Final    NONE Performed at [facility], [address] [place], [place] [address]    Culture   Final    NO GROWTH Performed at [facility], [place] [address] [place], [place] [address]    Report Status 09/20/2020 FINAL  Final  MRSA Next Gen by PCR, Nasal     Status: Abnormal   Collection Time: 09/18/20 11:30 PM   Specimen: Nasal  Mucosa; Nasal Swab  Result Value Ref Range Status   MRSA by PCR Next Gen DETECTED (A) NOT DETECTED Final    Comment: RESULT CALLED TO, READ BACK BY AND VERIFIED WITH:  DEAN,TRISHA @ 0803 09/19/20 BY STEPTR (NOTE) The GeneXpert MRSA Assay (FDA approved for NASAL specimens only), is one component of a comprehensive MRSA colonization surveillance program. It is not intended to diagnose MRSA infection nor to guide or monitor treatment for MRSA infections. Test performance is not FDA approved in patients less than 32 years old. Performed at [facility], [address] [place],  [address]       Radiology Studies: CT HEMATURIA WORKUP  Result Date: 09/22/2020 CLINICAL DATA:  Hematuria for 6-7 weeks with rectal bleeding. History of appendectomy, cholecystectomy and diabetes. EXAM: CT ABDOMEN AND PELVIS WITHOUT AND WITH CONTRAST TECHNIQUE: Multidetector CT imaging of the abdomen and pelvis was performed following the standard protocol before and following the bolus administration of intravenous contrast. CONTRAST:  100 mL OMNIPAQUE IOHEXOL 300 MG/ML  SOLN COMPARISON:  Limited correlation made with CT lumbar spine 06/08/2020. FINDINGS: Lower chest: Mild dependent atelectasis or scarring at both lung bases with a trace left pleural effusion. There is a moderate size hiatal hernia. Diffuse coronary artery atherosclerosis noted. Hepatobiliary: Pre contrast images demonstrate decreased hepatic density consistent with steatosis. There are scattered small calcified granulomas. Following contrast, no suspicious focal lesion or abnormal enhancement. No significant biliary dilatation status post cholecystectomy. Pancreas: Unremarkable. No pancreatic ductal dilatation or surrounding inflammatory changes. Spleen: Scattered small calcified granulomas. No splenomegaly or other focal abnormality. Adrenals/Urinary Tract: There is a right adrenal adenoma measuring 2.0 cm and demonstrating a density of -4 HU. The left  adrenal gland appears normal. Pre-contrast images demonstrate no renal, ureteral or bladder calculi. Post-contrast, both kidneys enhance normally. There is no evidence of enhancing renal mass. There is mild left-sided pelvicaliectasis without evidence ureteral obstruction or focal urothelial lesion. On the right, there is irregular wall thickening and enhancement of the renal pelvis and proximal ureter without evidence  of obstruction. Foley catheter is in place. There is diffuse bladder wall thickening. There is dependent high density within the bladder lumen on the pre contrast images, likely reflecting blood. No focal bladder lesion identified. Stomach/Bowel: No enteric contrast administered. As above, moderate-size hiatal hernia. Probable postsurgical changes at the cecum related to previous appendectomy. No evidence of bowel wall thickening, distention or surrounding inflammation. Vascular/Lymphatic: There are no enlarged abdominal or pelvic lymph nodes. Mild aortic and branch vessel atherosclerosis without acute vascular findings. The portal, superior mesenteric and splenic veins are patent. Reproductive: There is moderate distension of the endometrial cavity to 3.7 cm. No focal endometrial or cervical mass identified. No adnexal mass. Other: There is soft tissue stranding in the base of the mesentery, inferior to pancreas. No focal fluid collection. No ascites or free intraperitoneal air. Musculoskeletal: No acute or significant osseous findings. Extensive postsurgical changes throughout the thoracolumbar spine status post fusion from T11 through the upper sacrum. Compared with the prior CT, the fusion has been extended inferiorly from the L4 level. Partial corpectomy and strut graft extending from T12 to L3 appear unchanged. There is discontinuity of both posterior spinal rods at L1 on the right and L2 on the left, new from previous examination and presumably iatrogenic, related to the interval extension of  the fusion. Degenerative changes are present at both hips. IMPRESSION: 1. Abnormal wall thickening and enhancement of the right renal pelvis and proximal ureter without apparent focal urothelial lesion or obstruction. This could reflect inflammation or neoplasm. No evidence of urinary tract calculus. 2. Probable hemorrhage within the bladder lumen with diffuse nonspecific bladder wall thickening. Consider cystoscopy. 3. Abnormal distension of the endometrial cavity suspicious for cervical or endometrial malignancy. Gynecology evaluation recommended. 4. Interval inferior extension of the thoracolumbar fusion to the upper sacrum with further discussion as above. 5. Additional incidental findings including evidence of prior granulomatous disease, coronary atherosclerosis, a moderate size hiatal hernia and nonspecific soft tissue stranding in the mesenteric fat. Electronically Signed   By: Richardean Sale M.D.   On: 09/22/2020 07:48   US PELVIC COMPLETE WITH TRANSVAGINAL  Result Date: 09/22/2020 CLINICAL DATA:  Endometrial thickening on recent CT. EXAM: TRANSABDOMINAL AND TRANSVAGINAL ULTRASOUND OF PELVIS TECHNIQUE: Both transabdominal and transvaginal ultrasound examinations of the pelvis were performed. Transabdominal technique was performed for global imaging of the pelvis including uterus, ovaries, adnexal regions, and pelvic cul-de-sac. It was necessary to proceed with endovaginal exam following the transabdominal exam to visualize the endometrium. COMPARISON:  CT scan 09/21/2020 FINDINGS: Uterus Measurements: 7.7 x 5.2 x 6.0 cm = volume: 126 mL. No fibroids or other mass visualized. Endometrium Thickness: 28 mm.  No focal abnormality visualized. Right ovary Not visualized. Left ovary Not visualized. Other findings Trace free fluid in the pelvis. IMPRESSION: 28 mm endometrial thickness. Endometrial thickness is considered abnormal for an asymptomatic post-menopausal female. Endometrial sampling should be  considered to exclude carcinoma. Electronically Signed   By: Misty Stanley M.D.   On: 09/22/2020 13:41     Scheduled Meds:  Chlorhexidine Gluconate Cloth  6 each Topical Q0600   gabapentin  100 mg Oral BID   insulin aspart  0-9 Units Subcutaneous TID AC & HS   mirabegron ER  50 mg Oral Daily   mupirocin ointment  1 application Nasal BID   venlafaxine XR  150 mg Oral Q breakfast   Continuous Infusions:  0.9 % NaCl with KCl 20 mEq / L 50 mL/hr at 09/22/20 1059     LOS:  4 days   Roxan Hockey, MD [facility]  To contact the attending provider between 7A-7P or the covering provider during after hours 7P-7A, please log into the web site [email or web address] and access using universal [place] password for that web site. If you do not have the password, please call the hospital operator.  09/22/2020, 5:29 PM

## 2020-09-22 NOTE — Progress Notes (Signed)
2 Days Post-Op Subjective: Patient reports no pelvic pain. Foley draining clear urine  Objective: Vital signs in last 24 hours: Temp:  [97.7 F (36.5 C)-98.2 F (36.8 C)] 98 F (36.7 C) (07/15 0427) Pulse Rate:  [75-83] 79 (07/15 0428) Resp:  [19-20] 19 (07/15 0427) BP: (131-154)/(56-63) 131/56 (07/15 0427) SpO2:  [97 %-99 %] 99 % (07/15 0428)  Intake/Output from previous day: 07/14 0701 - 07/15 0700 In: 1240 [P.O.:1240] Out: 3300 [Urine:3300] Intake/Output this shift: Total I/O In: -  Out: 900 [Urine:900]  Physical Exam:  General:alert, cooperative, and appears stated age GI: soft, non tender, normal bowel sounds, no palpable masses, no organomegaly, no inguinal hernia Female genitalia: not done Extremities: extremities normal, atraumatic, no cyanosis or edema  Lab Results: Recent Labs    09/21/20 0853 09/21/20 1543 09/22/20 0556  HGB 9.8* 9.2* 9.5*  HCT 30.4* 27.7* 28.9*   BMET Recent Labs    09/21/20 0456 09/22/20 0556  NA 133* 133*  K 3.7 4.0  CL 104 104  CO2 25 24  GLUCOSE 83 85  BUN 11 11  CREATININE 0.52 0.36*  CALCIUM 7.8* 7.8*   No results for input(s): LABPT, INR in the last 72 hours. No results for input(s): LABURIN in the last 72 hours. Results for orders placed or performed during the hospital encounter of 09/18/20  Urine culture     Status: None   Collection Time: 09/18/20  4:15 PM   Specimen: Urine, Catheterized  Result Value Ref Range Status   Specimen Description   Final    URINE, CATHETERIZED Performed at [facility], [address], [place], [place] [address]    Special Requests   Final    NONE Performed at [facility], [address] [place],  [address]    Culture   Final    NO GROWTH Performed at [facility], [place] [address], [place],  [address]    Report Status 09/20/2020 FINAL  Final  MRSA Next Gen by PCR, Nasal     Status: Abnormal   Collection Time: 09/18/20 11:30 PM   Specimen: Nasal Mucosa;  Nasal Swab  Result Value Ref Range Status   MRSA by PCR Next Gen DETECTED (A) NOT DETECTED Final    Comment: RESULT CALLED TO, READ BACK BY AND VERIFIED WITH:  DEAN,TRISHA @ 0803 09/19/20 BY STEPTR (NOTE) The GeneXpert MRSA Assay (FDA approved for NASAL specimens only), is one component of a comprehensive MRSA colonization surveillance program. It is not intended to diagnose MRSA infection nor to guide or monitor treatment for MRSA infections. Test performance is not FDA approved in patients less than 40 years old. Performed at [facility], [address], [place],  [address]     Studies/Results: CT HEMATURIA WORKUP  Result Date: 09/22/2020 CLINICAL DATA:  Hematuria for 6-7 weeks with rectal bleeding. History of appendectomy, cholecystectomy and diabetes. EXAM: CT ABDOMEN AND PELVIS WITHOUT AND WITH CONTRAST TECHNIQUE: Multidetector CT imaging of the abdomen and pelvis was performed following the standard protocol before and following the bolus administration of intravenous contrast. CONTRAST:  100 mL OMNIPAQUE IOHEXOL 300 MG/ML  SOLN COMPARISON:  Limited correlation made with CT lumbar spine 06/08/2020. FINDINGS: Lower chest: Mild dependent atelectasis or scarring at both lung bases with a trace left pleural effusion. There is a moderate size hiatal hernia. Diffuse coronary artery atherosclerosis noted. Hepatobiliary: Pre contrast images demonstrate decreased hepatic density consistent with steatosis. There are scattered small calcified granulomas. Following contrast, no suspicious focal lesion  or abnormal enhancement. No significant biliary dilatation status post cholecystectomy. Pancreas: Unremarkable. No pancreatic ductal dilatation or surrounding inflammatory changes. Spleen: Scattered small calcified granulomas. No splenomegaly or other focal abnormality. Adrenals/Urinary Tract: There is a right adrenal adenoma measuring 2.0 cm and demonstrating a density of -4 HU. The left adrenal  gland appears normal. Pre-contrast images demonstrate no renal, ureteral or bladder calculi. Post-contrast, both kidneys enhance normally. There is no evidence of enhancing renal mass. There is mild left-sided pelvicaliectasis without evidence ureteral obstruction or focal urothelial lesion. On the right, there is irregular wall thickening and enhancement of the renal pelvis and proximal ureter without evidence of obstruction. Foley catheter is in place. There is diffuse bladder wall thickening. There is dependent high density within the bladder lumen on the pre contrast images, likely reflecting blood. No focal bladder lesion identified. Stomach/Bowel: No enteric contrast administered. As above, moderate-size hiatal hernia. Probable postsurgical changes at the cecum related to previous appendectomy. No evidence of bowel wall thickening, distention or surrounding inflammation. Vascular/Lymphatic: There are no enlarged abdominal or pelvic lymph nodes. Mild aortic and branch vessel atherosclerosis without acute vascular findings. The portal, superior mesenteric and splenic veins are patent. Reproductive: There is moderate distension of the endometrial cavity to 3.7 cm. No focal endometrial or cervical mass identified. No adnexal mass. Other: There is soft tissue stranding in the base of the mesentery, inferior to pancreas. No focal fluid collection. No ascites or free intraperitoneal air. Musculoskeletal: No acute or significant osseous findings. Extensive postsurgical changes throughout the thoracolumbar spine status post fusion from T11 through the upper sacrum. Compared with the prior CT, the fusion has been extended inferiorly from the L4 level. Partial corpectomy and strut graft extending from T12 to L3 appear unchanged. There is discontinuity of both posterior spinal rods at L1 on the right and L2 on the left, new from previous examination and presumably iatrogenic, related to the interval extension of the  fusion. Degenerative changes are present at both hips. IMPRESSION: 1. Abnormal wall thickening and enhancement of the right renal pelvis and proximal ureter without apparent focal urothelial lesion or obstruction. This could reflect inflammation or neoplasm. No evidence of urinary tract calculus. 2. Probable hemorrhage within the bladder lumen with diffuse nonspecific bladder wall thickening. Consider cystoscopy. 3. Abnormal distension of the endometrial cavity suspicious for cervical or endometrial malignancy. Gynecology evaluation recommended. 4. Interval inferior extension of the thoracolumbar fusion to the upper sacrum with further discussion as above. 5. Additional incidental findings including evidence of prior granulomatous disease, coronary atherosclerosis, a moderate size hiatal hernia and nonspecific soft tissue stranding in the mesenteric fat. Electronically Signed   By: Richardean Sale M.D.   On: 09/22/2020 07:48    Assessment/Plan: 75 yo with gross hematuria Hematuria has now cleared and foley can be removed. She should followup in 1-2 weeks at [facility] for cystoscopy   LOS: 4 days   Krystal Ali 09/22/2020, 12:55 PM

## 2020-09-23 DIAGNOSIS — N924 Excessive bleeding in the premenopausal period: Secondary | ICD-10-CM | POA: Diagnosis present

## 2020-09-23 DIAGNOSIS — R9389 Abnormal findings on diagnostic imaging of other specified body structures: Secondary | ICD-10-CM | POA: Diagnosis present

## 2020-09-23 DIAGNOSIS — N950 Postmenopausal bleeding: Secondary | ICD-10-CM | POA: Diagnosis present

## 2020-09-23 DIAGNOSIS — R319 Hematuria, unspecified: Secondary | ICD-10-CM | POA: Diagnosis present

## 2020-09-23 LAB — BASIC METABOLIC PANEL
Anion gap: 3 — ABNORMAL LOW (ref 5–15)
BUN: 10 mg/dL (ref 8–23)
CO2: 27 mmol/L (ref 22–32)
Calcium: 7.7 mg/dL — ABNORMAL LOW (ref 8.9–10.3)
Chloride: 103 mmol/L (ref 98–111)
Creatinine, Ser: 0.47 mg/dL (ref 0.44–1.00)
GFR, Estimated: >60 mL/min (ref >60)
Glucose, Bld: 86 mg/dL (ref 70–99)
Potassium: 4.2 mmol/L (ref 3.5–5.1)
Sodium: 133 mmol/L — ABNORMAL LOW (ref 135–145)

## 2020-09-23 LAB — GLUCOSE, CAPILLARY
Glucose-Capillary: 87 mg/dL (ref 70–99)
Glucose-Capillary: 110 mg/dL — ABNORMAL HIGH (ref 70–99)
Glucose-Capillary: 86 mg/dL (ref 70–99)
Glucose-Capillary: 99 mg/dL (ref 70–99)

## 2020-09-23 LAB — CBC
HCT: 26.9 % — ABNORMAL LOW (ref 36.0–46.0)
Hemoglobin: 9.1 g/dL — ABNORMAL LOW (ref 12.0–15.0)
MCH: 32 pg (ref 26.0–34.0)
MCHC: 33.8 g/dL (ref 30.0–36.0)
MCV: 94.7 fL (ref 80.0–100.0)
Platelets: 245 10*3/uL (ref 150–400)
RBC: 2.84 MIL/uL — ABNORMAL LOW (ref 3.87–5.11)
RDW: 14.6 % (ref 11.5–15.5)
WBC: 3.5 10*3/uL — ABNORMAL LOW (ref 4.0–10.5)
nRBC: 0 % (ref 0.0–0.2)

## 2020-09-23 MED ORDER — TAMSULOSIN HCL 0.4 MG PO CAPS
0.4000 mg | ORAL_CAPSULE | Freq: Once | ORAL | Status: AC
  Administered 2020-09-23: 0.4 mg via ORAL
  Filled 2020-09-23: qty 1

## 2020-09-23 MED ORDER — HYDROXYZINE HCL 25 MG PO TABS
25.0000 mg | ORAL_TABLET | Freq: Three times a day (TID) | ORAL | Status: DC | PRN
  Administered 2020-09-23 – 2020-09-24 (×2): 25 mg via ORAL
  Filled 2020-09-23 (×2): qty 1

## 2020-09-23 MED ORDER — PANTOPRAZOLE SODIUM 40 MG PO TBEC: 40.0000 mg | DELAYED_RELEASE_TABLET | Freq: Two times a day (BID) | ORAL | 2 refills | Status: DC

## 2020-09-23 MED ORDER — HYDROXYZINE HCL 25 MG PO TABS: 25.0000 mg | ORAL_TABLET | Freq: Three times a day (TID) | ORAL | 0 refills | Status: DC | PRN

## 2020-09-23 MED ORDER — ACETAMINOPHEN 325 MG PO TABS: 650.0000 mg | ORAL_TABLET | Freq: Four times a day (QID) | ORAL | 0 refills | Status: AC | PRN

## 2020-09-23 MED ORDER — TAMSULOSIN HCL 0.4 MG PO CAPS: 0.4000 mg | ORAL_CAPSULE | Freq: Every day | ORAL | 0 refills | Status: AC

## 2020-09-23 MED ORDER — HYDROCODONE-ACETAMINOPHEN 5-325 MG PO TABS: 1.0000 | ORAL_TABLET | Freq: Three times a day (TID) | ORAL | 0 refills | Status: DC | PRN

## 2020-09-23 NOTE — Discharge Summary (Addendum)
Krystal Ali, is a 75 y.o. female  DOB 1945/06/30  MRN [id number].  Admission date:  09/18/2020  Admitting Physician  Christel Mormon, MD  Discharge Date:  09/23/2020   Primary MD  Caprice Renshaw, MD  Recommendations for primary care physician for things to follow:   1)follow up with Dr Tania Ade, MD  [place] next week for  endometrial sampling to exclude malignancy---As per Dr Elonda Husky  she will need formal hysteroscopy and uterine curettage . Address: [address] [place], [place] [address] Phone: [phone]  2) Please follow-up with Urologist Dr. Alyson Ingles next week-- for recheck and follow-up evaluation  in his office----[facility], [address] [place] [address], [place] [address] Phone Number----[phone]  3)Follow-up Gastroenterologist Dr. Jenetta Downer with [facility]--- for evaluation  -address: [address] [place], [place] [address], Phone: [phone]----you advised to have a repeat colonoscopy in 3 months  4)Avoid ibuprofen/Advil/Aleve/Motrin/Goody Powders/Naproxen/BC powders/Meloxicam/Diclofenac/Indomethacin and other Nonsteroidal anti-inflammatory medications as these will make you more likely to bleed and can cause stomach ulcers, can also cause Kidney problems.   5) repeat CBC and BMP blood test advised next week   Admission Diagnosis  Hemorrhagic cystitis [N30.91] GI bleeding [K92.2] Guaiac positive stools [R19.5] Anemia, unspecified type [D64.9]   Discharge Diagnosis  Hemorrhagic cystitis [N30.91] GI bleeding [K92.2] Guaiac positive stools [R19.5] Anemia, unspecified type [D64.9]    Principal Problem:   Abnormal vaginal bleeding with endometrial thickness less than 16 mm present on transvaginal ultrasound in POST-Menopausal patient Active Problems:   Post-menopausal bleeding-EndoMetrial Thickening 28 mm   GI  bleeding   Anemia   Hematuria   Diabetes mellitus without complication (HCC)   Guaiac positive stools      Past Medical History:  Diagnosis Date   Anxiety    Diabetes mellitus without complication (HCC)    GERD (gastroesophageal reflux disease)    Hyperlipidemia    Hypertension    Retention of urine, unspecified    Spinal stenosis of lumbar region without neurogenic claudication     Past Surgical History:  Procedure Laterality Date   APPENDECTOMY     BACK SURGERY  08/2020   BREAST SURGERY     CHOLECYSTECTOMY      HPI  from the history and physical done on the day of admission:    Krystal Ali is a 75 y.o. female with medical history significant for type 2 diabetes mellitus, GERD, hypertension, dyslipidemia, depression and anxiety, overactive bladder, lumbar stenosis and urinary retention, who presented to the emergency room with acute onset of suspected rectal bleeding versus hematuria versus vaginal bleeding.  The patient has been having intermittent hematuria for the last several weeks.  The patient was seen here on 7/1 and treated for UTI with p.o. Bactrim.  Urine culture then grew E. coli that had MIC less than 24 Bactrim and 44 Proteus mirabilis.  Both bacteria had MIC less than 0.254 ceftriaxone.  She has been having continued dysuria, urinary frequency and urgency with hematuria.  She denies any nausea or vomiting but has been having lower abdominal discomfort and malaise.  She is recovering from back surgery 7 weeks ago and is currently at rehab.  She has some residual low back pain that radiates to both flanks but it has been getting better.  She denies any headache or dizziness or blurred vision.  No chest pain or dyspnea or palpitations, cough or wheezing or hemoptysis.  No dyspnea on exertion.  No other bleeding diathesis.  ED Course: Upon position to the emergency room, blood pressure was 108/46 with a heart rate of 93 and respiratory to 16.  Temperature was 98.1 and  pulse ox 91% on room air.  Later respiratory rate was up to 22-24 with pulse oximetry of 97% on room air.Marland Kitchen  Urinalysis was positive for UTI with many bacteria, more than 50 RBCs and 21-50 WBCs.  CMP was remarkable for hyponatremia of 128 and blood glucose of 167 with a BUN of 27 and alk phos of 151 with albumin of 2.5 and total protein 6.6.  CBC showed WBC of 7.7 hemoglobin 8.9 hematocrit 27.8 with platelets of 496.  Her CBC on 7/1 showed hemoglobin of 13 and hematocrit 39.5 with platelets of 378 however on 6/11 hemoglobin was 9 and hematocrit 26.1 with platelets of 181.  The patient's stool Hemoccult came back positive.  EKG: None ordered. Imaging: None ordered.  The patient was given 1 g of IV Rocephin, 1 p.o. Norco and 500 mill IV normal saline bolus.  She will be admitted to a telemetry bed for further evaluation and management.    Hospital Course:   Assessment & Plan: 1-acute blood loss anemia secondary to hematuria and GI bleed -GI consult appreciated, status post  EGD and colonoscopy on 09/20/20 -No major abnormalities appreciated on endoscopy; colonoscopy without overt bleeding seen but poor preparation.  GI recommending repeat colonoscopy in about 3 months due to poor prep -Continue PPI twice a day  -H&H relatively stable after transfusion of 1 unit of PRBC --hemoglobin currently above 9 -   2-sepsis due to UTI Recent Proteus mirabilis and E. Coli-based on culture from 09/08/2020 -Repeat urine culture from 09/18/2020 NGTD Empirically treated with IV Rocephin , however repeat urine cultures are negative so no further antibiotics indicated at this time   3-hyponatremia -in the setting of poor oral intake and dehydration -Sodium is up to 133 -Avoid dehydration   4-type 2 diabetes with neuropathy -A1c is 5.5 reflecting excellent diabetic control PTA -Continue gabapentin for diabetic neuropathy   5-depression and anxiety -no SI or hallucinations -continue effexor    6-overreactive bladder -continue myrbetriq/Flomax   7-hemorrhagic cystitis -urology consulted, a previously discussed with Dr. Alinda Money   -Official urology consult from Dr. Alyson Ingles appreciated -She might end up requiring a cystoscopy as outpatient - okay to remove Foley catheter per urologist -Flomax as ordered   8)Possible Gyn Malignancy- Pelvic ultrasound with 28 mm endometrial thickness, -CT abd shows--- Abnormal distension of the endometrial cavity suspicious for cervical or endometrial malignancy.  Given concerns about vaginal bleeding patient will need endometrial sampling to exclude malignancy -As per Dr Elonda Husky she will need formal hysteroscopy and uterine curettage  -Patient will be seen next week as outpatient oncologist  9)Urinary Retention--- patient failed voiding trial on 09/23/2020 -Foley catheter reinserted, --she will need another voiding trial when she sees urologist next week    Code Status: full code. Family Communication: daughter Colletta [place] updated over the phone on 09/22/2020 Disposition:  Dispo: The patient  is from: SNF              Anticipated d/c is to:  [facility]               Consultants:  Urology--Dr. Alyson Ingles GI service-Dr Jenetta Downer Gyn--Dr. Elonda Husky   Procedures:   Discharge Condition: stable  Follow UP   Follow-up Information     Florian Buff, MD. Schedule an appointment as soon as possible for a visit in 3 day(s).   Specialties: Obstetrics and Gynecology, Radiology Contact information: [place] [address] [phone]         Cleon Gustin, MD. Schedule an appointment as soon as possible for a visit in 3 day(s).   Specialty: Urology Contact information: [address] [place] [address] [phone]         Montez Morita, Quillian Quince, MD. Schedule an appointment as soon as possible for a visit in 2 month(s).   Specialty: Gastroenterology Why: for Colonoscopy in 3 months Contact  information: [address] [place] [address] [phone]                 Diet and Activity recommendation:  As advised  Discharge Instructions    Discharge Instructions     Call MD for:  difficulty breathing, headache or visual disturbances   Complete by: As directed    Call MD for:  persistant dizziness or light-headedness   Complete by: As directed    Call MD for:  persistant nausea and vomiting   Complete by: As directed    Call MD for:  severe uncontrolled pain   Complete by: As directed    Call MD for:  temperature >100.4   Complete by: As directed    Diet - low sodium heart healthy   Complete by: As directed    Discharge instructions   Complete by: As directed    1)follow up with Dr Tania Ade, MD  [facility] next week for  endometrial sampling to exclude malignancy---As per Dr Elonda Husky  she will need formal hysteroscopy and uterine curettage . Address: [address] [place], [place] [address] Phone: [phone]  2) Please follow-up with Urologist Dr. Alyson Ingles next week-- for recheck and follow-up evaluation  in his office----[facility], [address], [place] [address], [place] [address] Phone Number----[phone]  3)Follow-up Gastroenterologist Dr. Jenetta Downer with [facility]--- for evaluation  -address: [address] [place], [place] [address], Phone: [phone]----you advised to have a repeat colonoscopy in 3 months  4)Avoid ibuprofen/Advil/Aleve/Motrin/Goody Powders/Naproxen/BC powders/Meloxicam/Diclofenac/Indomethacin and other Nonsteroidal anti-inflammatory medications as these will make you more likely to bleed and can cause stomach ulcers, can also cause Kidney problems.   5) repeat CBC and BMP blood test advised next week   Increase activity slowly   Complete by: As directed         Discharge Medications     Allergies as of 09/23/2020       Reactions   Demerol  [meperidine Hcl]    aggitation   Morphine And Related         Medication List     STOP taking these medications    lidocaine 5 % Commonly known as: Lidoderm       TAKE these medications    acetaminophen 325 MG tablet Commonly known as: TYLENOL Take 2 tablets (650 mg total) by mouth every 6 (six) hours as needed for mild pain (or Fever >/=  101).   blood glucose meter kit and supplies Dispense based on patient and insurance preference. Use up to four times daily as directed. (FOR ICD-10 E10.9, E11.9).   gabapentin 100 MG capsule Commonly known as: NEURONTIN Take 1 capsule (100 mg total) by mouth at bedtime.   HYDROcodone-acetaminophen 5-325 MG tablet Commonly known as: NORCO/VICODIN Take 1 tablet by mouth every 8 (eight) hours as needed for moderate pain.   hydrOXYzine 25 MG tablet Commonly known as: ATARAX/VISTARIL Take 1 tablet (25 mg total) by mouth 3 (three) times daily as needed for itching or anxiety.   melatonin 5 MG Tabs Take 5 mg by mouth at bedtime.   oxybutynin 5 MG 24 hr tablet Commonly known as: DITROPAN-XL Take 5 mg by mouth daily.   pantoprazole 40 MG tablet Commonly known as: Protonix Take 1 tablet (40 mg total) by mouth 2 (two) times daily.   tamsulosin 0.4 MG Caps capsule Commonly known as: FLOMAX Take 1 capsule (0.4 mg total) by mouth daily after supper.   venlafaxine XR 150 MG 24 hr capsule Commonly known as: EFFEXOR-XR Take 1 capsule (150 mg total) by mouth daily with breakfast.       Major procedures and Radiology Reports - PLEASE review detailed and final reports for all details, in brief -  DG Chest Port 1 View  Result Date: 09/08/2020 CLINICAL DATA:  Hematuria. EXAM: PORTABLE CHEST 1 VIEW COMPARISON:  None. FINDINGS: The lungs are hyperinflated. Mild, diffuse, chronic appearing increased lung markings are seen. There is no evidence of focal consolidation, pleural effusion or pneumothorax. The cardiac silhouette is mildly enlarged.  A large hiatal hernia is seen. A radiopaque fusion plate and screws are seen overlying the cervical spine with bilateral pedicle screws noted within the lower thoracic and upper lumbar spine. A chronic fracture of the mid right clavicle is seen. IMPRESSION: Chronic and postoperative changes without acute or active cardiopulmonary disease. Electronically Signed   By: Virgina Norfolk M.D.   On: 09/08/2020 19:21   CT HEMATURIA WORKUP  Result Date: 09/22/2020 CLINICAL DATA:  Hematuria for 6-7 weeks with rectal bleeding. History of appendectomy, cholecystectomy and diabetes. EXAM: CT ABDOMEN AND PELVIS WITHOUT AND WITH CONTRAST TECHNIQUE: Multidetector CT imaging of the abdomen and pelvis was performed following the standard protocol before and following the bolus administration of intravenous contrast. CONTRAST:  100 mL OMNIPAQUE IOHEXOL 300 MG/ML  SOLN COMPARISON:  Limited correlation made with CT lumbar spine 06/08/2020. FINDINGS: Lower chest: Mild dependent atelectasis or scarring at both lung bases with a trace left pleural effusion. There is a moderate size hiatal hernia. Diffuse coronary artery atherosclerosis noted. Hepatobiliary: Pre contrast images demonstrate decreased hepatic density consistent with steatosis. There are scattered small calcified granulomas. Following contrast, no suspicious focal lesion or abnormal enhancement. No significant biliary dilatation status post cholecystectomy. Pancreas: Unremarkable. No pancreatic ductal dilatation or surrounding inflammatory changes. Spleen: Scattered small calcified granulomas. No splenomegaly or other focal abnormality. Adrenals/Urinary Tract: There is a right adrenal adenoma measuring 2.0 cm and demonstrating a density of -4 HU. The left adrenal gland appears normal. Pre-contrast images demonstrate no renal, ureteral or bladder calculi. Post-contrast, both kidneys enhance normally. There is no evidence of enhancing renal mass. There is mild left-sided  pelvicaliectasis without evidence ureteral obstruction or focal urothelial lesion. On the right, there is irregular wall thickening and enhancement of the renal pelvis and proximal ureter without evidence of obstruction. Foley catheter is in place. There is diffuse bladder wall thickening. There is dependent  high density within the bladder lumen on the pre contrast images, likely reflecting blood. No focal bladder lesion identified. Stomach/Bowel: No enteric contrast administered. As above, moderate-size hiatal hernia. Probable postsurgical changes at the cecum related to previous appendectomy. No evidence of bowel wall thickening, distention or surrounding inflammation. Vascular/Lymphatic: There are no enlarged abdominal or pelvic lymph nodes. Mild aortic and branch vessel atherosclerosis without acute vascular findings. The portal, superior mesenteric and splenic veins are patent. Reproductive: There is moderate distension of the endometrial cavity to 3.7 cm. No focal endometrial or cervical mass identified. No adnexal mass. Other: There is soft tissue stranding in the base of the mesentery, inferior to pancreas. No focal fluid collection. No ascites or free intraperitoneal air. Musculoskeletal: No acute or significant osseous findings. Extensive postsurgical changes throughout the thoracolumbar spine status post fusion from T11 through the upper sacrum. Compared with the prior CT, the fusion has been extended inferiorly from the L4 level. Partial corpectomy and strut graft extending from T12 to L3 appear unchanged. There is discontinuity of both posterior spinal rods at L1 on the right and L2 on the left, new from previous examination and presumably iatrogenic, related to the interval extension of the fusion. Degenerative changes are present at both hips. IMPRESSION: 1. Abnormal wall thickening and enhancement of the right renal pelvis and proximal ureter without apparent focal urothelial lesion or obstruction.  This could reflect inflammation or neoplasm. No evidence of urinary tract calculus. 2. Probable hemorrhage within the bladder lumen with diffuse nonspecific bladder wall thickening. Consider cystoscopy. 3. Abnormal distension of the endometrial cavity suspicious for cervical or endometrial malignancy. Gynecology evaluation recommended. 4. Interval inferior extension of the thoracolumbar fusion to the upper sacrum with further discussion as above. 5. Additional incidental findings including evidence of prior granulomatous disease, coronary atherosclerosis, a moderate size hiatal hernia and nonspecific soft tissue stranding in the mesenteric fat. Electronically Signed   By: Richardean Sale M.D.   On: 09/22/2020 07:48   US PELVIC COMPLETE WITH TRANSVAGINAL  Result Date: 09/22/2020 CLINICAL DATA:  Endometrial thickening on recent CT. EXAM: TRANSABDOMINAL AND TRANSVAGINAL ULTRASOUND OF PELVIS TECHNIQUE: Both transabdominal and transvaginal ultrasound examinations of the pelvis were performed. Transabdominal technique was performed for global imaging of the pelvis including uterus, ovaries, adnexal regions, and pelvic cul-de-sac. It was necessary to proceed with endovaginal exam following the transabdominal exam to visualize the endometrium. COMPARISON:  CT scan 09/21/2020 FINDINGS: Uterus Measurements: 7.7 x 5.2 x 6.0 cm = volume: 126 mL. No fibroids or other mass visualized. Endometrium Thickness: 28 mm.  No focal abnormality visualized. Right ovary Not visualized. Left ovary Not visualized. Other findings Trace free fluid in the pelvis. IMPRESSION: 28 mm endometrial thickness. Endometrial thickness is considered abnormal for an asymptomatic post-menopausal female. Endometrial sampling should be considered to exclude carcinoma. Electronically Signed   By: Misty Stanley M.D.   On: 09/22/2020 13:41    Micro Results   Recent Results (from the past 240 hour(s))  Urine culture     Status: None   Collection Time:  09/18/20  4:15 PM   Specimen: Urine, Catheterized  Result Value Ref Range Status   Specimen Description   Final    URINE, CATHETERIZED Performed at [facility], [address], [place], [place] [address]    Special Requests   Final    NONE Performed at [facility], [address] [place], [place] [address]    Culture   Final    NO GROWTH Performed at [facility], [place] [address] [place], [place] [address]    Report Status 09/20/2020 FINAL  Final  MRSA Next Gen by PCR, Nasal     Status: Abnormal   Collection Time: 09/18/20 11:30 PM   Specimen: Nasal Mucosa; Nasal Swab  Result Value Ref Range Status   MRSA by PCR Next Gen DETECTED (A) NOT DETECTED Final    Comment: RESULT CALLED TO, READ BACK BY AND VERIFIED WITH:  DEAN,TRISHA @ 0803 09/19/20 BY STEPTR (NOTE) The GeneXpert MRSA Assay (FDA approved for NASAL specimens only), is one component of a comprehensive MRSA colonization surveillance program. It is not intended to diagnose MRSA infection nor to guide or monitor treatment for MRSA infections. Test performance is not FDA approved in patients less than 16 years old. Performed at [facility], [address], [place], [place] [address]        Today   [place] today has no new complaints  No fever  Or chills  -Patient failed voiding trial, Foley catheter reinserted on 09/23/20  No Nausea, Vomiting or Diarrhea         Patient has been seen and examined prior to discharge   Objective   Blood pressure (!) 141/75, pulse 80, temperature 98.7 F (37.1 C), temperature source Oral, resp. rate 18, height 5' 5" (1.651 m), weight 63.8 kg, SpO2 96 %.   Intake/Output Summary (Last 24 hours) at 09/23/2020 1227 Last data filed at 09/23/2020 0910 Gross per 24 hour  Intake 1080 ml  Output 2000 ml  Net -920 ml   Exam General exam: Alert, awake, oriented x 3;   Respiratory system: Clear to auscultation. Respiratory effort normal.   Cardiovascular  system:RRR. No rubs, no gallops, no JVD. Gastrointestinal system: soft, NT, ND, +BS,  Central nervous system: Alert and oriented. No focal neurological deficits. Extremities: No cyanosis or clubbing, no lower extremity edema. Skin: No petechiae. Psychiatry: Judgement and insight appear normal. Mood & affect appropriate. GU- Foley was taken out, -Patient failed voiding trial, Foley catheter reinserted on 09/23/20   Data Review   CBC w Diff:  Lab Results  Component Value Date   WBC 3.5 (L) 09/23/2020   HGB 9.1 (L) 09/23/2020   HCT 26.9 (L) 09/23/2020   PLT 245 09/23/2020   LYMPHOPCT 3 09/08/2020   MONOPCT 5 09/08/2020   EOSPCT 0 09/08/2020   BASOPCT 0 09/08/2020    CMP:  Lab Results  Component Value Date   NA 133 (L) 09/23/2020   K 4.2 09/23/2020   CL 103 09/23/2020   CO2 27 09/23/2020   BUN 10 09/23/2020   CREATININE 0.47 09/23/2020   PROT 6.6 09/18/2020   ALBUMIN 2.5 (L) 09/18/2020   BILITOT 0.1 (L) 09/18/2020   ALKPHOS 151 (H) 09/18/2020   AST 29 09/18/2020   ALT 24 09/18/2020  .   Total Discharge time is about 33 minutes  Roxan Hockey M.D on 09/23/2020 at 12:27 PM  Go to [email or web address] -  for contact info  [facility] - Office  [phone]

## 2020-09-23 NOTE — Progress Notes (Signed)
14 Fr foley place.   Report called to [place] at [place]. Pt. Waiting for transport.

## 2020-09-23 NOTE — TOC Transition Note (Signed)
Transition of Care [facility]) - CM/SW Discharge Note   Patient Details  Name: Krystal Ali MRN: [id number] Date of Birth: 31-May-1945  Transition of Care [facility]) CM/SW Contact:  Natasha Bence, LCSW Phone Number: 09/23/2020, 12:05 PM   Clinical Narrative:    CSW notified of patient's readiness for discharge. Debbie with [facility] agreeable to take patient back on 02/13/21. CSW completed med necessity. EMS called by RN. Nurse to call report. TOC signing off.    Final next level of care: Skilled Nursing Facility Barriers to Discharge: Barriers Resolved   Patient Goals and CMS Choice Patient states their goals for this hospitalization and ongoing recovery are:: Rehab with SNF CMS Medicare.gov Compare Post Acute Care list provided to:: Patient Choice offered to / list presented to : Patient  Discharge Placement                Patient to be transferred to facility by: [facility] Name of family member notified: Lucas,Stephanie Patient and family notified of of transfer: 09/23/20  Discharge Plan and Services In-house Referral: Clinical Social Work   Post Acute Care Choice: Resumption of Product/process development scientist, [place]          DME Arranged: N/A                    Social Determinants of Health (SDOH) Interventions     Readmission Risk Interventions No flowsheet data found.

## 2020-09-23 NOTE — Discharge Instructions (Signed)
1)follow up with Dr Tania Ade, MD  [place] next week for  endometrial sampling to exclude malignancy---As per Dr Elonda Husky  she will need formal hysteroscopy and uterine curettage . Address: [address] [place], [place] [address] Phone: [phone]  2) Please follow-up with Urologist Dr. Alyson Ingles next week-- for recheck and follow-up evaluation  in his office----[facility], [address] [place] [address], [place] [address] Phone Number----[phone]  3)Follow-up Gastroenterologist Dr. Jenetta Downer with [facility]--- for evaluation  -address: [address] [place], [place] [address], Phone: [phone]----you advised to have a repeat colonoscopy in 3 months  4)Avoid ibuprofen/Advil/Aleve/Motrin/Goody Powders/Naproxen/BC powders/Meloxicam/Diclofenac/Indomethacin and other Nonsteroidal anti-inflammatory medications as these will make you more likely to bleed and can cause stomach ulcers, can also cause Kidney problems.   5) repeat CBC and BMP blood test advised next week

## 2020-09-23 NOTE — Progress Notes (Signed)
Pt. Due to void at 1300. Nurse assisted pt. With bedpan and female urinal, no success on void. Bladder scan pt. Scan showed 563mL in bladder. Provider notified. Foley will be replace for D/C.

## 2020-09-24 DIAGNOSIS — F411 Generalized anxiety disorder: Secondary | ICD-10-CM | POA: Diagnosis not present

## 2020-09-24 DIAGNOSIS — R52 Pain, unspecified: Secondary | ICD-10-CM | POA: Diagnosis not present

## 2020-09-24 DIAGNOSIS — R531 Weakness: Secondary | ICD-10-CM | POA: Diagnosis not present

## 2020-09-24 DIAGNOSIS — R2681 Unsteadiness on feet: Secondary | ICD-10-CM | POA: Diagnosis present

## 2020-09-24 DIAGNOSIS — R41841 Cognitive communication deficit: Secondary | ICD-10-CM | POA: Diagnosis present

## 2020-09-24 DIAGNOSIS — Z978 Presence of other specified devices: Secondary | ICD-10-CM | POA: Diagnosis not present

## 2020-09-24 DIAGNOSIS — K219 Gastro-esophageal reflux disease without esophagitis: Secondary | ICD-10-CM | POA: Diagnosis present

## 2020-09-24 DIAGNOSIS — G629 Polyneuropathy, unspecified: Secondary | ICD-10-CM | POA: Diagnosis not present

## 2020-09-24 DIAGNOSIS — M6281 Muscle weakness (generalized): Secondary | ICD-10-CM | POA: Diagnosis present

## 2020-09-24 DIAGNOSIS — D649 Anemia, unspecified: Secondary | ICD-10-CM | POA: Diagnosis not present

## 2020-09-24 DIAGNOSIS — I959 Hypotension, unspecified: Secondary | ICD-10-CM | POA: Diagnosis not present

## 2020-09-24 DIAGNOSIS — M4800 Spinal stenosis, site unspecified: Secondary | ICD-10-CM | POA: Diagnosis not present

## 2020-09-24 DIAGNOSIS — M48062 Spinal stenosis, lumbar region with neurogenic claudication: Secondary | ICD-10-CM | POA: Diagnosis present

## 2020-09-24 DIAGNOSIS — R319 Hematuria, unspecified: Secondary | ICD-10-CM | POA: Diagnosis not present

## 2020-09-24 DIAGNOSIS — E785 Hyperlipidemia, unspecified: Secondary | ICD-10-CM | POA: Diagnosis present

## 2020-09-24 DIAGNOSIS — R5381 Other malaise: Secondary | ICD-10-CM | POA: Diagnosis not present

## 2020-09-24 DIAGNOSIS — Z743 Need for continuous supervision: Secondary | ICD-10-CM | POA: Diagnosis not present

## 2020-09-24 DIAGNOSIS — I10 Essential (primary) hypertension: Secondary | ICD-10-CM | POA: Diagnosis present

## 2020-09-24 DIAGNOSIS — M19071 Primary osteoarthritis, right ankle and foot: Secondary | ICD-10-CM | POA: Diagnosis not present

## 2020-09-24 DIAGNOSIS — M79661 Pain in right lower leg: Secondary | ICD-10-CM | POA: Diagnosis not present

## 2020-09-24 DIAGNOSIS — M961 Postlaminectomy syndrome, not elsewhere classified: Secondary | ICD-10-CM | POA: Diagnosis not present

## 2020-09-24 DIAGNOSIS — R9389 Abnormal findings on diagnostic imaging of other specified body structures: Secondary | ICD-10-CM

## 2020-09-24 DIAGNOSIS — Z87440 Personal history of urinary (tract) infections: Secondary | ICD-10-CM | POA: Diagnosis not present

## 2020-09-24 DIAGNOSIS — R2689 Other abnormalities of gait and mobility: Secondary | ICD-10-CM | POA: Diagnosis present

## 2020-09-24 DIAGNOSIS — R8290 Unspecified abnormal findings in urine: Secondary | ICD-10-CM | POA: Diagnosis not present

## 2020-09-24 DIAGNOSIS — R310 Gross hematuria: Secondary | ICD-10-CM | POA: Diagnosis not present

## 2020-09-24 DIAGNOSIS — R4701 Aphasia: Secondary | ICD-10-CM | POA: Diagnosis present

## 2020-09-24 DIAGNOSIS — R279 Unspecified lack of coordination: Secondary | ICD-10-CM | POA: Diagnosis present

## 2020-09-24 DIAGNOSIS — F419 Anxiety disorder, unspecified: Secondary | ICD-10-CM | POA: Diagnosis present

## 2020-09-24 DIAGNOSIS — F32A Depression, unspecified: Secondary | ICD-10-CM | POA: Diagnosis not present

## 2020-09-24 DIAGNOSIS — N924 Excessive bleeding in the premenopausal period: Secondary | ICD-10-CM

## 2020-09-24 DIAGNOSIS — N390 Urinary tract infection, site not specified: Secondary | ICD-10-CM | POA: Diagnosis not present

## 2020-09-24 DIAGNOSIS — M4326 Fusion of spine, lumbar region: Secondary | ICD-10-CM | POA: Diagnosis present

## 2020-09-24 DIAGNOSIS — Z9181 History of falling: Secondary | ICD-10-CM | POA: Diagnosis not present

## 2020-09-24 DIAGNOSIS — R339 Retention of urine, unspecified: Secondary | ICD-10-CM | POA: Diagnosis not present

## 2020-09-24 DIAGNOSIS — M1990 Unspecified osteoarthritis, unspecified site: Secondary | ICD-10-CM | POA: Diagnosis not present

## 2020-09-24 DIAGNOSIS — K921 Melena: Secondary | ICD-10-CM | POA: Diagnosis present

## 2020-09-24 DIAGNOSIS — Z79899 Other long term (current) drug therapy: Secondary | ICD-10-CM | POA: Diagnosis not present

## 2020-09-24 LAB — GLUCOSE, CAPILLARY: Glucose-Capillary: 83 mg/dL (ref 70–99)

## 2020-09-24 MED ORDER — TAMSULOSIN HCL 0.4 MG PO CAPS: 0.4000 mg | ORAL_CAPSULE | Freq: Every day | ORAL | Status: DC

## 2020-09-24 NOTE — Progress Notes (Signed)
-  Patient was discharged to [facility] on 9/39/0300 - I was advised that there was some difficulties with transportation from the hospital to [facility] on 12/01/28 -- The transport team picked patient up this morning on 09/24/2020 transferred to [place] today has no new complaints  No fever  Or chills No Nausea, Vomiting or Diarrhea        Patient has been seen and examined prior to discharge -- Patient remains appropriate for discharge to [facility] today -Please see full discharge summary dated 09/23/2020 Roxan Hockey, MD

## 2020-09-25 ENCOUNTER — Encounter (HOSPITAL_COMMUNITY): Payer: Self-pay | Admitting: Gastroenterology

## 2020-09-25 DIAGNOSIS — K219 Gastro-esophageal reflux disease without esophagitis: Secondary | ICD-10-CM | POA: Diagnosis not present

## 2020-09-25 DIAGNOSIS — F32A Depression, unspecified: Secondary | ICD-10-CM | POA: Diagnosis not present

## 2020-09-25 DIAGNOSIS — D649 Anemia, unspecified: Secondary | ICD-10-CM | POA: Diagnosis not present

## 2020-09-26 DIAGNOSIS — R5381 Other malaise: Secondary | ICD-10-CM | POA: Diagnosis not present

## 2020-09-26 DIAGNOSIS — G629 Polyneuropathy, unspecified: Secondary | ICD-10-CM | POA: Diagnosis not present

## 2020-10-03 DIAGNOSIS — R52 Pain, unspecified: Secondary | ICD-10-CM | POA: Diagnosis not present

## 2020-10-03 DIAGNOSIS — R319 Hematuria, unspecified: Secondary | ICD-10-CM | POA: Diagnosis not present

## 2020-10-04 ENCOUNTER — Telehealth (INDEPENDENT_AMBULATORY_CARE_PROVIDER_SITE_OTHER): Payer: Self-pay | Admitting: Gastroenterology

## 2020-10-04 NOTE — Telephone Encounter (Signed)
Naomi from [facility] called stated patients discharge summary stated to schedule a colonoscopy in 3 months with Dr Jenetta Downer - ph# [phone]

## 2020-10-05 DIAGNOSIS — F32A Depression, unspecified: Secondary | ICD-10-CM | POA: Diagnosis not present

## 2020-10-05 DIAGNOSIS — R5381 Other malaise: Secondary | ICD-10-CM | POA: Diagnosis not present

## 2020-10-05 DIAGNOSIS — Z978 Presence of other specified devices: Secondary | ICD-10-CM | POA: Diagnosis not present

## 2020-10-05 DIAGNOSIS — N390 Urinary tract infection, site not specified: Secondary | ICD-10-CM | POA: Diagnosis not present

## 2020-10-05 DIAGNOSIS — R319 Hematuria, unspecified: Secondary | ICD-10-CM | POA: Diagnosis not present

## 2020-10-05 DIAGNOSIS — M4800 Spinal stenosis, site unspecified: Secondary | ICD-10-CM | POA: Diagnosis not present

## 2020-10-19 ENCOUNTER — Encounter: Payer: Medicare Other | Admitting: Obstetrics & Gynecology

## 2020-10-30 DIAGNOSIS — K219 Gastro-esophageal reflux disease without esophagitis: Secondary | ICD-10-CM | POA: Diagnosis not present

## 2020-10-30 DIAGNOSIS — D649 Anemia, unspecified: Secondary | ICD-10-CM | POA: Diagnosis not present

## 2020-10-30 DIAGNOSIS — F32A Depression, unspecified: Secondary | ICD-10-CM | POA: Diagnosis not present

## 2020-10-30 DIAGNOSIS — E785 Hyperlipidemia, unspecified: Secondary | ICD-10-CM | POA: Diagnosis not present

## 2020-10-30 DIAGNOSIS — M4800 Spinal stenosis, site unspecified: Secondary | ICD-10-CM | POA: Diagnosis not present

## 2020-10-30 DIAGNOSIS — F419 Anxiety disorder, unspecified: Secondary | ICD-10-CM | POA: Diagnosis not present

## 2020-10-30 DIAGNOSIS — I10 Essential (primary) hypertension: Secondary | ICD-10-CM | POA: Diagnosis not present

## 2020-11-06 ENCOUNTER — Encounter: Payer: Medicare Other | Admitting: Obstetrics & Gynecology

## 2020-11-09 DIAGNOSIS — M4800 Spinal stenosis, site unspecified: Secondary | ICD-10-CM | POA: Diagnosis not present

## 2020-11-09 DIAGNOSIS — K219 Gastro-esophageal reflux disease without esophagitis: Secondary | ICD-10-CM | POA: Diagnosis not present

## 2020-11-09 DIAGNOSIS — E785 Hyperlipidemia, unspecified: Secondary | ICD-10-CM | POA: Diagnosis not present

## 2020-11-09 DIAGNOSIS — N390 Urinary tract infection, site not specified: Secondary | ICD-10-CM | POA: Diagnosis not present

## 2020-11-09 DIAGNOSIS — F419 Anxiety disorder, unspecified: Secondary | ICD-10-CM | POA: Diagnosis not present

## 2020-11-09 DIAGNOSIS — I10 Essential (primary) hypertension: Secondary | ICD-10-CM | POA: Diagnosis not present

## 2020-11-16 DIAGNOSIS — I10 Essential (primary) hypertension: Secondary | ICD-10-CM | POA: Diagnosis not present

## 2020-11-16 DIAGNOSIS — M1990 Unspecified osteoarthritis, unspecified site: Secondary | ICD-10-CM | POA: Diagnosis not present

## 2020-11-16 DIAGNOSIS — R5381 Other malaise: Secondary | ICD-10-CM | POA: Diagnosis not present

## 2020-11-16 DIAGNOSIS — R52 Pain, unspecified: Secondary | ICD-10-CM | POA: Diagnosis not present

## 2020-11-16 DIAGNOSIS — M4800 Spinal stenosis, site unspecified: Secondary | ICD-10-CM | POA: Diagnosis not present

## 2020-11-16 DIAGNOSIS — F419 Anxiety disorder, unspecified: Secondary | ICD-10-CM | POA: Diagnosis not present

## 2020-11-16 DIAGNOSIS — G629 Polyneuropathy, unspecified: Secondary | ICD-10-CM | POA: Diagnosis not present

## 2020-11-16 DIAGNOSIS — E785 Hyperlipidemia, unspecified: Secondary | ICD-10-CM | POA: Diagnosis not present

## 2020-11-17 ENCOUNTER — Ambulatory Visit: Payer: Medicare Other | Admitting: Urology

## 2020-11-28 ENCOUNTER — Encounter: Payer: Self-pay | Admitting: Urology

## 2020-11-28 ENCOUNTER — Other Ambulatory Visit: Payer: Self-pay

## 2020-11-28 ENCOUNTER — Ambulatory Visit (INDEPENDENT_AMBULATORY_CARE_PROVIDER_SITE_OTHER): Payer: Medicare Other | Admitting: Urology

## 2020-11-28 VITALS — BP 157/70 | HR 78

## 2020-11-28 DIAGNOSIS — Z87440 Personal history of urinary (tract) infections: Secondary | ICD-10-CM | POA: Diagnosis not present

## 2020-11-28 DIAGNOSIS — R339 Retention of urine, unspecified: Secondary | ICD-10-CM | POA: Diagnosis not present

## 2020-11-28 DIAGNOSIS — M961 Postlaminectomy syndrome, not elsewhere classified: Secondary | ICD-10-CM | POA: Diagnosis not present

## 2020-11-28 DIAGNOSIS — R8290 Unspecified abnormal findings in urine: Secondary | ICD-10-CM | POA: Diagnosis not present

## 2020-11-28 DIAGNOSIS — R310 Gross hematuria: Secondary | ICD-10-CM | POA: Diagnosis not present

## 2020-11-28 DIAGNOSIS — D649 Anemia, unspecified: Secondary | ICD-10-CM | POA: Diagnosis not present

## 2020-11-28 DIAGNOSIS — N3091 Cystitis, unspecified with hematuria: Secondary | ICD-10-CM

## 2020-11-28 DIAGNOSIS — M1990 Unspecified osteoarthritis, unspecified site: Secondary | ICD-10-CM | POA: Diagnosis not present

## 2020-11-28 LAB — URINALYSIS, ROUTINE W REFLEX MICROSCOPIC
Bilirubin, UA: NEGATIVE
Glucose, UA: NEGATIVE
Ketones, UA: NEGATIVE
Nitrite, UA: POSITIVE — AB
Specific Gravity, UA: 1.02 (ref 1.005–1.030)
Urobilinogen, Ur: 1 mg/dL (ref 0.2–1.0)
pH, UA: 6 (ref 5.0–7.5)

## 2020-11-28 LAB — MICROSCOPIC EXAMINATION

## 2020-11-28 NOTE — Progress Notes (Signed)
Urological Symptom Review  Patient is experiencing the following symptoms: Blood in urine Urinary tract infection   Review of Systems  Gastrointestinal (upper)  : Negative for upper GI symptoms  Gastrointestinal (lower) : Negative for lower GI symptoms  Constitutional : Negative for symptoms  Skin: Negative for skin symptoms  Eyes: Negative for eye symptoms  Ear/Nose/Throat : Negative for Ear/Nose/Throat symptoms  Hematologic/Lymphatic: Negative for Hematologic/Lymphatic symptoms  Cardiovascular : Leg swelling  Respiratory : Negative for respiratory symptoms  Endocrine: Excessive thirst  Musculoskeletal: Back pain Joint pain  Neurological: Negative for neurological symptoms  Psychologic: Depression Anxiety

## 2020-11-28 NOTE — Progress Notes (Signed)
Assessment: 1. Gross hematuria   2. History of UTI   3. Incomplete bladder emptying   4. Abnormal urine findings     Plan: I personally reviewed the patient's hospital record including notes, labs, and imaging studies.  I personally reviewed the CT study from 09/21/2020. She has had a recent episode of gross hematuria with associated UTI.  She will likely need further evaluation with cystoscopy for evaluation of the bladder.  I also think it would be reasonable to consider repeat imaging given the prior findings involving the right upper collecting system.  Unfortunately, cystoscopy will need to be postponed until apparent UTI has been treated. I do think it would be reasonable to attempt removal of the Foley catheter and see if she is able to void spontaneously. Urine culture sent today. Will contact with results. Begin Omnicef 300 mg PO BID x 7 days. Continue Flomax D/C oxybutynin Return to office in 2-3 weeks for cystoscopy She also needs GYN follow-up for abnormal findings on transvaginal U/S  Chief Complaint:  Chief Complaint  Patient presents with   Hematuria     History of Present Illness:  Krystal Ali is a 75 y.o. year old female who is seen in consultation from Caprice Renshaw, MD for evaluation of gross hematuria.  She presented to the emergency room on 09/08/2020 with gross hematuria.  Urinalysis demonstrated >50 RBCs, >50 WBCs, and many bacteria.  Urine culture grew >100 K. Proteus and >100 K E. coli.  She presented to the emergency room again on 09/21/2020 with rectal bleeding and gross hematuria as well as difficulty urinating.  She reported gross painless hematuria for several weeks.  She was also having associated urgency, frequency, and dysuria.  A Foley catheter was placed at that time.  CT imaging showed a thickened bladder wall and thickened right renal pelvis consistent with infection or possible malignancy.  She also had changes noted to the uterus and cervix.  She was  discharged from the hospital with plans for urologic follow-up.  She has not returned until today's visit. She continues with a Foley catheter.  She denies any problems voiding prior to her hospitalization.  She reports that the catheter was last changed several days ago.  She is unsure if she has had a voiding trial at the nursing home. She has not had any further gross hematuria.  No abdominal or flank pain. She does report a history of recurrent UTIs. No history of tobacco use. Review of her medications indicate that she is currently taking tamsulosin 0.4 mg daily and oxybutynin ER 5 mg daily.  She was treated with Macrobid x7 days from 11/10/2020 to 11/17/2020. She is a resident at [facility].   Past Medical History:  Diagnosis Date   Anxiety    Diabetes mellitus without complication ([place])    GERD (gastroesophageal reflux disease)    Hyperlipidemia    Hypertension    Retention of urine, unspecified    Spinal stenosis of lumbar region without neurogenic claudication     Past Surgical History:  Procedure Laterality Date   APPENDECTOMY     BACK SURGERY  08/2020   BIOPSY  09/20/2020   Procedure: BIOPSY;  Surgeon: Harvel Quale, MD;  [facility];  Service: Gastroenterology;;   BREAST SURGERY     CHOLECYSTECTOMY     COLONOSCOPY WITH PROPOFOL N/A 09/20/2020   Procedure: COLONOSCOPY WITH PROPOFOL;  Surgeon: Harvel Quale, MD;  [facility];  Service: Gastroenterology;  Laterality: N/A;   ESOPHAGOGASTRODUODENOSCOPY (EGD) WITH PROPOFOL N/A 09/20/2020   Procedure: ESOPHAGOGASTRODUODENOSCOPY (EGD) WITH PROPOFOL;  Surgeon: Harvel Quale, MD;  [facility];  Service: Gastroenterology;  Laterality: N/A;   POLYPECTOMY  09/20/2020   Procedure: POLYPECTOMY;  Surgeon: Montez Morita, Quillian Quince, MD;  [facility];  Service: Gastroenterology;;     Allergies  Allergen Reactions   Demerol [Meperidine Hcl]      aggitation   Morphine And Related      Family History  Problem Relation Age of Onset   Heart disease Father    COPD Brother     Social History   Tobacco Use   Smoking status: Never   Smokeless tobacco: Never  Vaping Use   Vaping Use: Never used  Substance Use Topics   Alcohol use: Not Currently   Drug use: Never    Review of symptoms:  Constitutional:  Negative for unexplained weight loss, night sweats, fever, chills ENT:  Negative for nose bleeds, sinus pain, painful swallowing CV:  Negative for chest pain, shortness of breath, exercise intolerance, palpitations, loss of consciousness Resp:  Negative for cough, wheezing, shortness of breath GI:  Negative for nausea, vomiting, diarrhea, bloody stools GU:  Positives noted in HPI; otherwise negative for urinary incontinence Neuro:  Negative for seizures, slurred speech Psych:  Negative for lack of energy, depression, anxiety Endocrine:  Negative for polydipsia, polyuria, symptoms of hypoglycemia (dizziness, hunger, sweating) Hematologic:  Negative for anemia, purpura, petechia, prolonged or excessive bleeding, use of anticoagulants  Allergic:  Negative for difficulty breathing or choking as a result of exposure to anything; no shellfish allergy; no allergic response (rash/itch) to materials, foods  Physical exam: BP (!) 157/70   Pulse 78  GENERAL APPEARANCE:  Elderly female in wheelchair, well developed, well nourished, NAD HEENT: Atraumatic, Normocephalic, oropharynx clear. NECK: Supple without lymphadenopathy or thyromegaly. LUNGS: Clear to auscultation bilaterally. HEART: Regular Rate and Rhythm without murmurs, gallops, or rubs. ABDOMEN: Soft, non-tender, No Masses. EXTREMITIES: Without clubbing, cyanosis, or edema. NEUROLOGIC:  Alert and oriented x 3, CN II-XII grossly intact.  MENTAL STATUS:  Appropriate. BACK:  Non-tender to palpation.  No CVAT SKIN:  Warm, dry and intact.    Results: U/A;  11-30 WBC, 3-10  RBC, many bacteria, + nitrite   I personally reviewed the CT study from 09/21/2020.  I agree with the results as noted above showing apparent inflammatory change involving the right renal pelvis and proximal ureter with enhancement as well as diffuse bladder wall thickening and dependent high density material within the bladder consistent with blood.

## 2020-11-30 DIAGNOSIS — R52 Pain, unspecified: Secondary | ICD-10-CM | POA: Diagnosis not present

## 2020-11-30 DIAGNOSIS — F419 Anxiety disorder, unspecified: Secondary | ICD-10-CM | POA: Diagnosis not present

## 2020-11-30 DIAGNOSIS — I10 Essential (primary) hypertension: Secondary | ICD-10-CM | POA: Diagnosis not present

## 2020-11-30 DIAGNOSIS — Z79899 Other long term (current) drug therapy: Secondary | ICD-10-CM | POA: Diagnosis not present

## 2020-11-30 DIAGNOSIS — G629 Polyneuropathy, unspecified: Secondary | ICD-10-CM | POA: Diagnosis not present

## 2020-11-30 DIAGNOSIS — E785 Hyperlipidemia, unspecified: Secondary | ICD-10-CM | POA: Diagnosis not present

## 2020-11-30 DIAGNOSIS — M1990 Unspecified osteoarthritis, unspecified site: Secondary | ICD-10-CM | POA: Diagnosis not present

## 2020-11-30 DIAGNOSIS — M4800 Spinal stenosis, site unspecified: Secondary | ICD-10-CM | POA: Diagnosis not present

## 2020-12-03 LAB — URINE CULTURE

## 2020-12-04 ENCOUNTER — Telehealth: Payer: Self-pay

## 2020-12-04 DIAGNOSIS — F419 Anxiety disorder, unspecified: Secondary | ICD-10-CM | POA: Diagnosis not present

## 2020-12-04 DIAGNOSIS — G629 Polyneuropathy, unspecified: Secondary | ICD-10-CM | POA: Diagnosis not present

## 2020-12-04 DIAGNOSIS — I10 Essential (primary) hypertension: Secondary | ICD-10-CM | POA: Diagnosis not present

## 2020-12-04 DIAGNOSIS — R52 Pain, unspecified: Secondary | ICD-10-CM | POA: Diagnosis not present

## 2020-12-04 DIAGNOSIS — E785 Hyperlipidemia, unspecified: Secondary | ICD-10-CM | POA: Diagnosis not present

## 2020-12-04 DIAGNOSIS — M1990 Unspecified osteoarthritis, unspecified site: Secondary | ICD-10-CM | POA: Diagnosis not present

## 2020-12-04 DIAGNOSIS — K219 Gastro-esophageal reflux disease without esophagitis: Secondary | ICD-10-CM | POA: Diagnosis not present

## 2020-12-04 DIAGNOSIS — M4800 Spinal stenosis, site unspecified: Secondary | ICD-10-CM | POA: Diagnosis not present

## 2020-12-04 NOTE — Progress Notes (Signed)
Sent via fax to [facility]. Spoke to Medical laboratory scientific officer. Orders faxed to [phone]

## 2020-12-04 NOTE — Telephone Encounter (Signed)
Verified with receptionist Tamika at [facility] that orders was received via fax.

## 2020-12-04 NOTE — Telephone Encounter (Signed)
-----   Message from Primus Bravo, MD sent at 12/04/2020  9:26 AM EDT ----- Please notify [facility] with following orders:  Complete Omnicef BID x 7 days.   Cephalexin 500 mg PO daily to begin after completing Omnicef. D/C oxybutynin Remove foley catheter in AM on 12/06/20.  May replace if patient unable to void spontaneously.

## 2020-12-07 ENCOUNTER — Encounter: Payer: Medicare Other | Admitting: Obstetrics & Gynecology

## 2020-12-13 DIAGNOSIS — G8929 Other chronic pain: Secondary | ICD-10-CM | POA: Diagnosis not present

## 2020-12-13 DIAGNOSIS — F331 Major depressive disorder, recurrent, moderate: Secondary | ICD-10-CM | POA: Diagnosis not present

## 2020-12-19 ENCOUNTER — Other Ambulatory Visit: Payer: Medicare Other | Admitting: Urology

## 2020-12-19 DIAGNOSIS — Z87440 Personal history of urinary (tract) infections: Secondary | ICD-10-CM

## 2020-12-25 DIAGNOSIS — F411 Generalized anxiety disorder: Secondary | ICD-10-CM | POA: Diagnosis not present

## 2020-12-28 ENCOUNTER — Other Ambulatory Visit: Payer: Medicaid Other | Admitting: Urology

## 2020-12-31 DIAGNOSIS — F411 Generalized anxiety disorder: Secondary | ICD-10-CM | POA: Diagnosis not present

## 2021-01-09 DIAGNOSIS — R52 Pain, unspecified: Secondary | ICD-10-CM | POA: Diagnosis not present

## 2021-01-09 DIAGNOSIS — K5900 Constipation, unspecified: Secondary | ICD-10-CM | POA: Diagnosis not present

## 2021-01-09 DIAGNOSIS — K219 Gastro-esophageal reflux disease without esophagitis: Secondary | ICD-10-CM | POA: Diagnosis not present

## 2021-01-10 ENCOUNTER — Encounter (INDEPENDENT_AMBULATORY_CARE_PROVIDER_SITE_OTHER): Payer: Self-pay

## 2021-01-10 ENCOUNTER — Other Ambulatory Visit (INDEPENDENT_AMBULATORY_CARE_PROVIDER_SITE_OTHER): Payer: Self-pay

## 2021-01-10 ENCOUNTER — Telehealth (INDEPENDENT_AMBULATORY_CARE_PROVIDER_SITE_OTHER): Payer: Self-pay

## 2021-01-10 DIAGNOSIS — Z86010 Personal history of colonic polyps: Secondary | ICD-10-CM

## 2021-01-10 MED ORDER — PEG 3350-KCL-NA BICARB-NACL 420 G PO SOLR: 4000.0000 mL | ORAL | 0 refills | Status: DC

## 2021-01-10 NOTE — Telephone Encounter (Signed)
LeighAnn Lavette Yankovich, CMA  

## 2021-01-15 ENCOUNTER — Encounter (INDEPENDENT_AMBULATORY_CARE_PROVIDER_SITE_OTHER): Payer: Self-pay

## 2021-01-16 NOTE — Patient Instructions (Signed)
Krystal Ali  01/16/2021     @PREFPERIOPPHARMACY @   Your procedure is scheduled on  01/23/2021.   Report to [facility] at  0930 A.M.   Call this number if you have problems the morning of surgery:  [phone]   Remember:  Follow the diet and prep instructions given to you by the office.    Take these medicines the morning of surgery with A SIP OF WATER      gabapentin, hydrocodone (if needed),protonix, effexor.    Do not wear jewelry, make-up or nail polish.  Do not wear lotions, powders, or perfumes, or deodorant.  Do not shave 48 hours prior to surgery.  Men may shave face and neck.  Do not bring valuables to the hospital.  [facility] is not responsible for any belongings or valuables.  Contacts, dentures or bridgework may not be worn into surgery.  Leave your suitcase in the car.  After surgery it may be brought to your room.  For patients admitted to the hospital, discharge time will be determined by your treatment team.  Patients discharged the day of surgery will not be allowed to drive home and must  have someone with them for 24 hours.    Special instructions:   DO NOT smoke tobacco or vape for 24 hours before your procedure.  Please read over the following fact sheets that you were given. Anesthesia Post-op Instructions and Care and Recovery After Surgery      Colonoscopy, Adult, Care After This sheet gives you information about how to care for yourself after your procedure. Your health care provider may also give you more specific instructions. If you have problems or questions, contact your health care provider. What can I expect after the procedure? After the procedure, it is common to have: A small amount of blood in your stool for 24 hours after the procedure. Some gas. Mild cramping or bloating of your abdomen. Follow these instructions at home: Eating and drinking  Drink enough fluid to keep your urine pale yellow. Follow  instructions from your health care provider about eating or drinking restrictions. Resume your normal diet as instructed by your health care provider. Avoid heavy or fried foods that are hard to digest. Activity Rest as told by your health care provider. Avoid sitting for a long time without moving. Get up to take short walks every 1-2 hours. This is important to improve blood flow and breathing. Ask for help if you feel weak or unsteady. Return to your normal activities as told by your health care provider. Ask your health care provider what activities are safe for you. Managing cramping and bloating  Try walking around when you have cramps or feel bloated. Apply heat to your abdomen as told by your health care provider. Use the heat source that your health care provider recommends, such as a moist heat pack or a heating pad. Place a towel between your skin and the heat source. Leave the heat on for 20-30 minutes. Remove the heat if your skin turns bright red. This is especially important if you are unable to feel pain, heat, or cold. You may have a greater risk of getting burned. General instructions If you were given a sedative during the procedure, it can affect you for several hours. Do not drive or operate machinery until your health care provider says that it is safe. For the first 24 hours after the procedure: Do not sign important documents.  Do not drink alcohol. Do your regular daily activities at a slower pace than normal. Eat soft foods that are easy to digest. Take over-the-counter and prescription medicines only as told by your health care provider. Keep all follow-up visits as told by your health care provider. This is important. Contact a health care provider if: You have blood in your stool 2-3 days after the procedure. Get help right away if you have: More than a small spotting of blood in your stool. Large blood clots in your stool. Swelling of your abdomen. Nausea or  vomiting. A fever. Increasing pain in your abdomen that is not relieved with medicine. Summary After the procedure, it is common to have a small amount of blood in your stool. You may also have mild cramping and bloating of your abdomen. If you were given a sedative during the procedure, it can affect you for several hours. Do not drive or operate machinery until your health care provider says that it is safe. Get help right away if you have a lot of blood in your stool, nausea or vomiting, a fever, or increased pain in your abdomen. This information is not intended to replace advice given to you by your health care provider. Make sure you discuss any questions you have with your health care provider. Document Revised: 01/01/2019 Document Reviewed: 09/21/2018 Elsevier Patient Education  [place] After This sheet gives you information about how to care for yourself after your procedure. Your health care provider may also give you more specific instructions. If you have problems or questions, contact your health care provider. What can I expect after the procedure? After the procedure, it is common to have: Tiredness. Forgetfulness about what happened after the procedure. Impaired judgment for important decisions. Nausea or vomiting. Some difficulty with balance. Follow these instructions at home: For the time period you were told by your health care provider:   Rest as needed. Do not participate in activities where you could fall or become injured. Do not drive or use machinery. Do not drink alcohol. Do not take sleeping pills or medicines that cause drowsiness. Do not make important decisions or sign legal documents. Do not take care of children on your own. Eating and drinking Follow the diet that is recommended by your health care provider. Drink enough fluid to keep your urine pale yellow. If you vomit: Drink water, juice, or soup when  you can drink without vomiting. Make sure you have little or no nausea before eating solid foods. General instructions Have a responsible adult stay with you for the time you are told. It is important to have someone help care for you until you are awake and alert. Take over-the-counter and prescription medicines only as told by your health care provider. If you have sleep apnea, surgery and certain medicines can increase your risk for breathing problems. Follow instructions from your health care provider about wearing your sleep device: Anytime you are sleeping, including during daytime naps. While taking prescription pain medicines, sleeping medicines, or medicines that make you drowsy. Avoid smoking. Keep all follow-up visits as told by your health care provider. This is important. Contact a health care provider if: You keep feeling nauseous or you keep vomiting. You feel light-headed. You are still sleepy or having trouble with balance after 24 hours. You develop a rash. You have a fever. You have redness or swelling around the IV site. Get help right away if: You have trouble breathing.  You have new-onset confusion at home. Summary For several hours after your procedure, you may feel tired. You may also be forgetful and have poor judgment. Have a responsible adult stay with you for the time you are told. It is important to have someone help care for you until you are awake and alert. Rest as told. Do not drive or operate machinery. Do not drink alcohol or take sleeping pills. Get help right away if you have trouble breathing, or if you suddenly become confused. This information is not intended to replace advice given to you by your health care provider. Make sure you discuss any questions you have with your health care provider. Document Revised: 11/11/2019 Document Reviewed: 01/28/2019 Elsevier Patient Education  2022 [employer].

## 2021-01-18 ENCOUNTER — Telehealth (INDEPENDENT_AMBULATORY_CARE_PROVIDER_SITE_OTHER): Payer: Self-pay

## 2021-01-18 ENCOUNTER — Encounter (HOSPITAL_COMMUNITY): Payer: Self-pay

## 2021-01-18 ENCOUNTER — Encounter (HOSPITAL_COMMUNITY)
Admission: RE | Admit: 2021-01-18 | Discharge: 2021-01-18 | Disposition: A | Discharge status: Home / Self Care | Payer: Medicare (Managed Care) | Source: Ambulatory Visit | Attending: Gastroenterology | Admitting: Gastroenterology

## 2021-01-18 DIAGNOSIS — E119 Type 2 diabetes mellitus without complications: Secondary | ICD-10-CM

## 2021-01-18 DIAGNOSIS — D649 Anemia, unspecified: Secondary | ICD-10-CM

## 2021-01-18 NOTE — Telephone Encounter (Signed)
Krystal Ali, Krystal Ali, CMA  Jyl Heinz, MD Well I suppose we have no choice thanks Krystal Ali        Previous Messages   ----- Message -----  From: Krystal Ali  Sent: 01/18/2021   9:11 AM EST  To: Krystal Ali, [facility] called and states this patient will not come to her PAT and they want to cancel the procedure.  I am going to go ahead and cancel her completely.   Thanks,   Krystal Ali

## 2021-01-23 ENCOUNTER — Encounter (HOSPITAL_COMMUNITY): Admission: RE | Payer: Self-pay | Source: Home / Self Care

## 2021-01-23 ENCOUNTER — Ambulatory Visit (HOSPITAL_COMMUNITY)
Admission: RE | Admit: 2021-01-23 | Payer: Medicare (Managed Care) | Source: Home / Self Care | Admitting: Gastroenterology

## 2021-01-23 SURGERY — COLONOSCOPY WITH PROPOFOL
Anesthesia: Monitor Anesthesia Care

## 2021-01-30 DIAGNOSIS — G8929 Other chronic pain: Secondary | ICD-10-CM | POA: Diagnosis not present

## 2021-01-30 DIAGNOSIS — F331 Major depressive disorder, recurrent, moderate: Secondary | ICD-10-CM | POA: Diagnosis not present

## 2021-01-31 ENCOUNTER — Other Ambulatory Visit (INDEPENDENT_AMBULATORY_CARE_PROVIDER_SITE_OTHER): Payer: Self-pay

## 2021-01-31 ENCOUNTER — Telehealth (INDEPENDENT_AMBULATORY_CARE_PROVIDER_SITE_OTHER): Payer: Self-pay

## 2021-01-31 ENCOUNTER — Encounter (INDEPENDENT_AMBULATORY_CARE_PROVIDER_SITE_OTHER): Payer: Self-pay

## 2021-01-31 DIAGNOSIS — Z86010 Personal history of colonic polyps: Secondary | ICD-10-CM

## 2021-01-31 MED ORDER — PEG 3350-KCL-NA BICARB-NACL 420 G PO SOLR: 4000.0000 mL | ORAL | 0 refills | Status: DC

## 2021-01-31 NOTE — Telephone Encounter (Signed)
Krystal Ali, CMA  

## 2021-02-12 ENCOUNTER — Telehealth (INDEPENDENT_AMBULATORY_CARE_PROVIDER_SITE_OTHER): Payer: Self-pay | Admitting: Gastroenterology

## 2021-02-12 NOTE — Telephone Encounter (Signed)
Krystal Ali from [facility] called stated patient wants to cancel her colonoscopy

## 2021-02-16 ENCOUNTER — Ambulatory Visit (HOSPITAL_COMMUNITY)
Admission: RE | Admit: 2021-02-16 | Payer: Medicare (Managed Care) | Source: Home / Self Care | Admitting: Gastroenterology

## 2021-02-16 ENCOUNTER — Encounter (HOSPITAL_COMMUNITY): Admission: RE | Payer: Self-pay | Source: Home / Self Care

## 2021-02-16 SURGERY — COLONOSCOPY WITH PROPOFOL
Anesthesia: Monitor Anesthesia Care

## 2021-05-01 ENCOUNTER — Other Ambulatory Visit: Payer: Self-pay

## 2021-05-03 ENCOUNTER — Ambulatory Visit: Payer: Medicare (Managed Care) | Admitting: Urology

## 2021-05-03 ENCOUNTER — Other Ambulatory Visit: Payer: Self-pay

## 2021-05-03 ENCOUNTER — Ambulatory Visit (INDEPENDENT_AMBULATORY_CARE_PROVIDER_SITE_OTHER): Payer: Medicare (Managed Care) | Admitting: Urology

## 2021-05-03 ENCOUNTER — Encounter: Payer: Self-pay | Admitting: Urology

## 2021-05-03 VITALS — BP 107/78 | HR 137 | Wt 140.0 lb

## 2021-05-03 DIAGNOSIS — R310 Gross hematuria: Secondary | ICD-10-CM

## 2021-05-03 DIAGNOSIS — R339 Retention of urine, unspecified: Secondary | ICD-10-CM

## 2021-05-03 DIAGNOSIS — Z87440 Personal history of urinary (tract) infections: Secondary | ICD-10-CM

## 2021-05-03 DIAGNOSIS — N390 Urinary tract infection, site not specified: Secondary | ICD-10-CM | POA: Diagnosis not present

## 2021-05-03 DIAGNOSIS — R8290 Unspecified abnormal findings in urine: Secondary | ICD-10-CM

## 2021-05-03 NOTE — Progress Notes (Signed)
Assessment: 1. Frequent UTI   2. Gross hematuria   3. Incomplete bladder emptying      Plan: I reviewed her recent culture results. Recommend repeat urine culture following completion of the IV antibiotics to confirm resolution of her UTI.  Please fax results to [phone]. Continue Flomax Recommend repeat evaluation with CT hematuria protocol given abnormalities noted on prior study from 7/22. Schedule for cystoscopy after CT done. Please transport patient in a Geri chair to allow cystoscopy to be performed in the office since we are unable to move her to the exam table.   Chief Complaint:  Chief Complaint  Patient presents with   Recurrent UTI    History of Present Illness:  Krystal Ali is a 76 y.o. year old female who is seen for further evaluation of recurrent UTIs and history of gross hematuria.  She was initially seen for the same in September 2022.   History:  She presented to the emergency room on 09/08/2020 with gross hematuria.  Urinalysis demonstrated >50 RBCs, >50 WBCs, and many bacteria.  Urine culture grew >100 K. Proteus and >100 K E. coli.  She presented to the emergency room again on 09/21/2020 with rectal bleeding and gross hematuria as well as difficulty urinating.  She reported gross painless hematuria for several weeks.  She was also having associated urgency, frequency, and dysuria.  A Foley catheter was placed at that time.  CT imaging showed a thickened bladder wall and thickened right renal pelvis consistent with infection or possible malignancy.  She also had changes noted to the uterus and cervix.  She was discharged from the hospital with plans for urologic follow-up.  She did not follow-up until September 2022.  At that time, she continued with a Foley catheter.  She denied any problems voiding prior to her hospitalization.   She was taking tamsulosin 0.4 mg daily and oxybutynin ER 5 mg daily.  She was treated with Macrobid x7 days from 11/10/2020 to  11/17/2020. Her Foley catheter was removed and the oxybutynin was discontinued.  She was scheduled for further evaluation with cystoscopy but has not returned. She also needed GYN follow-up for the abnormalities noted on vaginal ultrasound.  Urinalysis from 04/14/2021 showed 20-30 WBCs and 3+ bacteria.  Urine culture grew >100 K mixed flora. Urinalysis from 04/25/2021 showed 11-20 RBCs, 51-100 WBCs, and 3+ bacteria.  Urine culture grew >100 K ESBL E. coli. She has had symptoms of dysuria, increased frequency and nausea associated with UTIs.  She has not had any further gross hematuria.  She is currently on IV ertapenem for treatment of her most recent UTI.  She began the IV antibiotics on 04/30/2021.  She has noted some improvement in her urinary symptoms with the antibiotic therapy.  Portions of the above documentation were copied from a prior visit for review purposes only.  Past Medical History:  Diagnosis Date   Anxiety    Diabetes mellitus without complication ([place])    GERD (gastroesophageal reflux disease)    Hyperlipidemia    Hypertension    Retention of urine, unspecified    Spinal stenosis of lumbar region without neurogenic claudication     Past Surgical History:  Procedure Laterality Date   APPENDECTOMY     BACK SURGERY  08/2020   BIOPSY  09/20/2020   Procedure: BIOPSY;  Surgeon: Harvel Quale, MD;  [facility];  Service: Gastroenterology;;   BREAST SURGERY     CHOLECYSTECTOMY     COLONOSCOPY WITH PROPOFOL  N/A 09/20/2020   Procedure: COLONOSCOPY WITH PROPOFOL;  Surgeon: Harvel Quale, MD;  [facility];  Service: Gastroenterology;  Laterality: N/A;   ESOPHAGOGASTRODUODENOSCOPY (EGD) WITH PROPOFOL N/A 09/20/2020   Procedure: ESOPHAGOGASTRODUODENOSCOPY (EGD) WITH PROPOFOL;  Surgeon: Harvel Quale, MD;  [facility];  Service: Gastroenterology;  Laterality: N/A;   POLYPECTOMY  09/20/2020   Procedure: POLYPECTOMY;   Surgeon: Montez Morita, Quillian Quince, MD;  [facility];  Service: Gastroenterology;;     Allergies  Allergen Reactions   Demerol [Meperidine Hcl]     Agitation   Morphine And Related     No reaction listed on nursing home record     Family History  Problem Relation Age of Onset   Heart disease Father    COPD Brother     Social History   Tobacco Use   Smoking status: Never   Smokeless tobacco: Never  Vaping Use   Vaping Use: Never used  Substance Use Topics   Alcohol use: Not Currently   Drug use: Never    ROS: Constitutional:  Negative for fever, chills, weight loss CV: Negative for chest pain, previous MI, hypertension Respiratory:  Negative for shortness of breath, wheezing, sleep apnea, frequent cough GI:  Negative for nausea, vomiting, bloody stool, GERD  Physical exam: BP 107/78    Pulse (!) 137    Wt 140 lb (63.5 kg)    BMI 23.30 kg/m  GENERAL APPEARANCE:  Well appearing, well developed, well nourished, NAD HEENT:  Atraumatic, normocephalic, oropharynx clear NECK:  Supple without lymphadenopathy or thyromegaly ABDOMEN:  Soft, non-tender, no masses EXTREMITIES: No clubbing, cyanosis, or edema NEUROLOGIC:  Alert and oriented x 3, in wheelchair ,CN II-XII grossly intact MENTAL STATUS:  appropriate BACK:  Non-tender to palpation, No CVAT SKIN:  Warm, dry, and intact   Results: No specimen provided today.

## 2021-05-03 NOTE — Progress Notes (Unsigned)
Assessment: 1. History of UTI   2. Gross hematuria   3. Incomplete bladder emptying   4. Abnormal urine findings     Plan: I personally reviewed the patient's hospital record including notes, labs, and imaging studies.  I personally reviewed the CT study from 09/21/2020. She has had a recent episode of gross hematuria with associated UTI.  She will likely need further evaluation with cystoscopy for evaluation of the bladder.  I also think it would be reasonable to consider repeat imaging given the prior findings involving the right upper collecting system.  Unfortunately, cystoscopy will need to be postponed until apparent UTI has been treated. I do think it would be reasonable to attempt removal of the Foley catheter and see if she is able to void spontaneously. Urine culture sent today. Will contact with results. Begin Omnicef 300 mg PO BID x 7 days. Continue Flomax D/C oxybutynin Return to office in 2-3 weeks for cystoscopy She also needs GYN follow-up for abnormal findings on transvaginal U/S  Chief Complaint:  No chief complaint on file.   History of Present Illness:  Krystal Ali is a 76 y.o. year old female who is seen for further evaluation of gross hematuria.  She was initially seen for the same in September 2022.   History:  She presented to the emergency room on 09/08/2020 with gross hematuria.  Urinalysis demonstrated >50 RBCs, >50 WBCs, and many bacteria.  Urine culture grew >100 K. Proteus and >100 K E. coli.  She presented to the emergency room again on 09/21/2020 with rectal bleeding and gross hematuria as well as difficulty urinating.  She reported gross painless hematuria for several weeks.  She was also having associated urgency, frequency, and dysuria.  A Foley catheter was placed at that time.  CT imaging showed a thickened bladder wall and thickened right renal pelvis consistent with infection or possible malignancy.  She also had changes noted to the uterus and  cervix.  She was discharged from the hospital with plans for urologic follow-up.  She did not follow-up until September 2022.  At that time, she continued with a Foley catheter.  She denied any problems voiding prior to her hospitalization.   She was taking tamsulosin 0.4 mg daily and oxybutynin ER 5 mg daily.  She was treated with Macrobid x7 days from 11/10/2020 to 11/17/2020. Her Foley catheter was removed and the oxybutynin was discontinued.  She was scheduled for further evaluation with cystoscopy but has not returned. She also needed GYN follow-up for the abnormalities noted on vaginal ultrasound.  Urinalysis from 04/14/2021 showed 20-30 WBCs and 3+ bacteria.  Urine culture grew >100 K mixed flora. Urinalysis from 04/25/2021 showed 11-20 RBCs, 51-100 WBCs, and 3+ bacteria.  Urine culture grew >100 K ESBL E. coli.   Past Medical History:  Diagnosis Date   Anxiety    Diabetes mellitus without complication ([place])    GERD (gastroesophageal reflux disease)    Hyperlipidemia    Hypertension    Retention of urine, unspecified    Spinal stenosis of lumbar region without neurogenic claudication     Past Surgical History:  Procedure Laterality Date   APPENDECTOMY     BACK SURGERY  08/2020   BIOPSY  09/20/2020   Procedure: BIOPSY;  Surgeon: Harvel Quale, MD;  [facility];  Service: Gastroenterology;;   BREAST SURGERY     CHOLECYSTECTOMY     COLONOSCOPY WITH PROPOFOL N/A 09/20/2020   Procedure: COLONOSCOPY WITH PROPOFOL;  Surgeon:  Harvel Quale, MD;  [facility];  Service: Gastroenterology;  Laterality: N/A;   ESOPHAGOGASTRODUODENOSCOPY (EGD) WITH PROPOFOL N/A 09/20/2020   Procedure: ESOPHAGOGASTRODUODENOSCOPY (EGD) WITH PROPOFOL;  Surgeon: Harvel Quale, MD;  [facility];  Service: Gastroenterology;  Laterality: N/A;   POLYPECTOMY  09/20/2020   Procedure: POLYPECTOMY;  Surgeon: Montez Morita, Quillian Quince, MD;  [facility];  Service: Gastroenterology;;     Allergies  Allergen Reactions   Demerol [Meperidine Hcl]     Agitation   Morphine And Related     No reaction listed on nursing home record     Family History  Problem Relation Age of Onset   Heart disease Father    COPD Brother     Social History   Tobacco Use   Smoking status: Never   Smokeless tobacco: Never  Vaping Use   Vaping Use: Never used  Substance Use Topics   Alcohol use: Not Currently   Drug use: Never    ROS: Constitutional:  Negative for fever, chills, weight loss CV: Negative for chest pain, previous MI, hypertension Respiratory:  Negative for shortness of breath, wheezing, sleep apnea, frequent cough GI:  Negative for nausea, vomiting, bloody stool, GERD  Physical exam: There were no vitals taken for this visit. ***  Results: U/A:

## 2021-05-16 ENCOUNTER — Other Ambulatory Visit: Payer: Self-pay

## 2021-05-18 ENCOUNTER — Ambulatory Visit (HOSPITAL_COMMUNITY): Payer: Medicare (Managed Care)

## 2021-05-18 ENCOUNTER — Telehealth: Payer: Self-pay

## 2021-05-18 NOTE — Telephone Encounter (Signed)
Spoke with patient's nurse (H[place]) from [facility]. Verbal orders given and understood.  ?Office fax number given.  ?Orders also sent via fax to [phone] ?

## 2021-05-18 NOTE — Telephone Encounter (Signed)
-----   Message from Primus Bravo, MD sent at 05/18/2021  8:55 AM EST ----- ?Please contact [facility] and rehab and inquire if they are able to obtain a catheterized urine specimen for culture on Krystal Ali.  The last culture that was sent showed mixed flora.  If possible, I would like to have them obtain a catheterized specimen for a urine culture.  Please have them fax results to our office when available. ? ? ?----- Message ----- ?From: Jennette Bill ?Sent: 05/16/2021   9:34 AM EST ?To: Primus Bravo, MD ? ? ?

## 2021-05-18 NOTE — Progress Notes (Signed)
Sent via fax

## 2021-05-21 ENCOUNTER — Encounter (HOSPITAL_COMMUNITY): Payer: Self-pay | Admitting: Emergency Medicine

## 2021-05-21 ENCOUNTER — Inpatient Hospital Stay (HOSPITAL_COMMUNITY)
Admission: EM | Admit: 2021-05-21 | Discharge: 2021-05-29 | DRG: 308 | Disposition: A | Discharge status: Skilled Nursing Facility | Payer: Medicare (Managed Care) | Source: Skilled Nursing Facility | Attending: Internal Medicine | Admitting: Internal Medicine

## 2021-05-21 ENCOUNTER — Emergency Department (HOSPITAL_COMMUNITY): Payer: Medicare (Managed Care)

## 2021-05-21 ENCOUNTER — Other Ambulatory Visit: Payer: Self-pay

## 2021-05-21 DIAGNOSIS — Z8249 Family history of ischemic heart disease and other diseases of the circulatory system: Secondary | ICD-10-CM | POA: Diagnosis not present

## 2021-05-21 DIAGNOSIS — I5020 Unspecified systolic (congestive) heart failure: Secondary | ICD-10-CM | POA: Diagnosis not present

## 2021-05-21 DIAGNOSIS — N390 Urinary tract infection, site not specified: Secondary | ICD-10-CM | POA: Diagnosis present

## 2021-05-21 DIAGNOSIS — R339 Retention of urine, unspecified: Secondary | ICD-10-CM | POA: Diagnosis present

## 2021-05-21 DIAGNOSIS — N179 Acute kidney failure, unspecified: Secondary | ICD-10-CM | POA: Diagnosis present

## 2021-05-21 DIAGNOSIS — B952 Enterococcus as the cause of diseases classified elsewhere: Secondary | ICD-10-CM | POA: Diagnosis present

## 2021-05-21 DIAGNOSIS — I110 Hypertensive heart disease with heart failure: Secondary | ICD-10-CM | POA: Diagnosis present

## 2021-05-21 DIAGNOSIS — I480 Paroxysmal atrial fibrillation: Secondary | ICD-10-CM | POA: Diagnosis present

## 2021-05-21 DIAGNOSIS — B9620 Unspecified Escherichia coli [E. coli] as the cause of diseases classified elsewhere: Secondary | ICD-10-CM | POA: Diagnosis present

## 2021-05-21 DIAGNOSIS — E119 Type 2 diabetes mellitus without complications: Secondary | ICD-10-CM | POA: Diagnosis present

## 2021-05-21 DIAGNOSIS — J9601 Acute respiratory failure with hypoxia: Secondary | ICD-10-CM

## 2021-05-21 DIAGNOSIS — K219 Gastro-esophageal reflux disease without esophagitis: Secondary | ICD-10-CM | POA: Diagnosis present

## 2021-05-21 DIAGNOSIS — Z993 Dependence on wheelchair: Secondary | ICD-10-CM

## 2021-05-21 DIAGNOSIS — R0602 Shortness of breath: Secondary | ICD-10-CM | POA: Diagnosis present

## 2021-05-21 DIAGNOSIS — I429 Cardiomyopathy, unspecified: Secondary | ICD-10-CM | POA: Diagnosis present

## 2021-05-21 DIAGNOSIS — I5021 Acute systolic (congestive) heart failure: Secondary | ICD-10-CM | POA: Diagnosis present

## 2021-05-21 DIAGNOSIS — Z7951 Long term (current) use of inhaled steroids: Secondary | ICD-10-CM | POA: Diagnosis not present

## 2021-05-21 DIAGNOSIS — Z79899 Other long term (current) drug therapy: Secondary | ICD-10-CM | POA: Diagnosis not present

## 2021-05-21 DIAGNOSIS — R8281 Pyuria: Secondary | ICD-10-CM

## 2021-05-21 DIAGNOSIS — E785 Hyperlipidemia, unspecified: Secondary | ICD-10-CM | POA: Diagnosis present

## 2021-05-21 DIAGNOSIS — Z885 Allergy status to narcotic agent status: Secondary | ICD-10-CM

## 2021-05-21 DIAGNOSIS — I4891 Unspecified atrial fibrillation: Secondary | ICD-10-CM | POA: Diagnosis not present

## 2021-05-21 DIAGNOSIS — G8929 Other chronic pain: Secondary | ICD-10-CM | POA: Diagnosis present

## 2021-05-21 DIAGNOSIS — R319 Hematuria, unspecified: Secondary | ICD-10-CM

## 2021-05-21 DIAGNOSIS — Z87440 Personal history of urinary (tract) infections: Secondary | ICD-10-CM | POA: Diagnosis not present

## 2021-05-21 DIAGNOSIS — M48062 Spinal stenosis, lumbar region with neurogenic claudication: Secondary | ICD-10-CM | POA: Diagnosis present

## 2021-05-21 DIAGNOSIS — I4819 Other persistent atrial fibrillation: Secondary | ICD-10-CM | POA: Diagnosis not present

## 2021-05-21 DIAGNOSIS — I509 Heart failure, unspecified: Secondary | ICD-10-CM | POA: Diagnosis not present

## 2021-05-21 DIAGNOSIS — F419 Anxiety disorder, unspecified: Secondary | ICD-10-CM | POA: Diagnosis present

## 2021-05-21 DIAGNOSIS — Z7401 Bed confinement status: Secondary | ICD-10-CM | POA: Diagnosis not present

## 2021-05-21 LAB — CBC WITH DIFFERENTIAL/PLATELET
Abs Immature Granulocytes: 0.01 10*3/uL (ref 0.00–0.07)
Basophils Absolute: 0 10*3/uL (ref 0.0–0.1)
Basophils Relative: 0 %
Eosinophils Absolute: 0 10*3/uL (ref 0.0–0.5)
Eosinophils Relative: 1 %
HCT: 40.3 % (ref 36.0–46.0)
Hemoglobin: 12.9 g/dL (ref 12.0–15.0)
Immature Granulocytes: 0 %
Lymphocytes Relative: 24 %
Lymphs Abs: 1.3 10*3/uL (ref 0.7–4.0)
MCH: 33.1 pg (ref 26.0–34.0)
MCHC: 32 g/dL (ref 30.0–36.0)
MCV: 103.3 fL — ABNORMAL HIGH (ref 80.0–100.0)
Monocytes Absolute: 0.3 10*3/uL (ref 0.1–1.0)
Monocytes Relative: 6 %
Neutro Abs: 3.6 10*3/uL (ref 1.7–7.7)
Neutrophils Relative %: 69 %
Platelets: 232 10*3/uL (ref 150–400)
RBC: 3.9 MIL/uL (ref 3.87–5.11)
RDW: 12.7 % (ref 11.5–15.5)
WBC: 5.2 10*3/uL (ref 4.0–10.5)
nRBC: 0 % (ref 0.0–0.2)

## 2021-05-21 LAB — COMPREHENSIVE METABOLIC PANEL
ALT: 14 U/L (ref 0–44)
AST: 14 U/L — ABNORMAL LOW (ref 15–41)
Albumin: 3.5 g/dL (ref 3.5–5.0)
Alkaline Phosphatase: 82 U/L (ref 38–126)
Anion gap: 10 (ref 5–15)
BUN: 26 mg/dL — ABNORMAL HIGH (ref 8–23)
CO2: 25 mmol/L (ref 22–32)
Calcium: 8.5 mg/dL — ABNORMAL LOW (ref 8.9–10.3)
Chloride: 102 mmol/L (ref 98–111)
Creatinine, Ser: 0.74 mg/dL (ref 0.44–1.00)
GFR, Estimated: >60 mL/min (ref >60)
Glucose, Bld: 138 mg/dL — ABNORMAL HIGH (ref 70–99)
Potassium: 4.1 mmol/L (ref 3.5–5.1)
Sodium: 137 mmol/L (ref 135–145)
Total Bilirubin: 0.6 mg/dL (ref 0.3–1.2)
Total Protein: 6.7 g/dL (ref 6.5–8.1)

## 2021-05-21 LAB — URINALYSIS, ROUTINE W REFLEX MICROSCOPIC
Bilirubin Urine: NEGATIVE
Glucose, UA: NEGATIVE mg/dL
Ketones, ur: NEGATIVE mg/dL
Nitrite: POSITIVE — AB
Protein, ur: 100 mg/dL — AB
Specific Gravity, Urine: 1.018 (ref 1.005–1.030)
WBC, UA: >50 WBC/hpf — ABNORMAL HIGH (ref 0–5)
pH: 5 (ref 5.0–8.0)

## 2021-05-21 LAB — TSH: TSH: 2.946 u[IU]/mL (ref 0.350–4.500)

## 2021-05-21 LAB — PROTIME-INR
INR: 1.3 — ABNORMAL HIGH (ref 0.8–1.2)
Prothrombin Time: 15.8 seconds — ABNORMAL HIGH (ref 11.4–15.2)

## 2021-05-21 LAB — TROPONIN I (HIGH SENSITIVITY)
Troponin I (High Sensitivity): 17 ng/L (ref <18)
Troponin I (High Sensitivity): 15 ng/L (ref <18)

## 2021-05-21 LAB — MRSA NEXT GEN BY PCR, NASAL: MRSA by PCR Next Gen: DETECTED — AB

## 2021-05-21 LAB — LACTIC ACID, PLASMA: Lactic Acid, Venous: 1.4 mmol/L (ref 0.5–1.9)

## 2021-05-21 MED ORDER — SODIUM CHLORIDE 0.9 % IV BOLUS
1000.0000 mL | Freq: Once | INTRAVENOUS | Status: AC
  Administered 2021-05-21: 1000 mL via INTRAVENOUS

## 2021-05-21 MED ORDER — IPRATROPIUM-ALBUTEROL 0.5-2.5 (3) MG/3ML IN SOLN: 3.0000 mL | Freq: Four times a day (QID) | RESPIRATORY_TRACT | Status: DC | PRN

## 2021-05-21 MED ORDER — SODIUM CHLORIDE 0.9 % IV SOLN
2.0000 g | Freq: Once | INTRAVENOUS | Status: AC
Start: 2021-05-21 — End: 2021-05-21
  Administered 2021-05-21: 2 g via INTRAVENOUS
  Filled 2021-05-21: qty 20

## 2021-05-21 MED ORDER — APIXABAN 5 MG PO TABS: 5.0000 mg | ORAL_TABLET | Freq: Two times a day (BID) | ORAL | Status: DC

## 2021-05-21 MED ORDER — UMECLIDINIUM BROMIDE 62.5 MCG/ACT IN AEPB
1.0000 | INHALATION_SPRAY | Freq: Every day | RESPIRATORY_TRACT | Status: DC
  Administered 2021-05-22 – 2021-05-29 (×8): 1 via RESPIRATORY_TRACT
  Filled 2021-05-21 (×2): qty 7

## 2021-05-21 MED ORDER — CHLORHEXIDINE GLUCONATE CLOTH 2 % EX PADS
6.0000 | MEDICATED_PAD | Freq: Every day | CUTANEOUS | Status: AC
  Administered 2021-05-21 – 2021-05-25 (×4): 6 via TOPICAL

## 2021-05-21 MED ORDER — GABAPENTIN 300 MG PO CAPS
600.0000 mg | ORAL_CAPSULE | Freq: Three times a day (TID) | ORAL | Status: DC
  Administered 2021-05-21 – 2021-05-29 (×22): 600 mg via ORAL
  Filled 2021-05-21 (×22): qty 2

## 2021-05-21 MED ORDER — TAMSULOSIN HCL 0.4 MG PO CAPS
0.4000 mg | ORAL_CAPSULE | Freq: Every day | ORAL | Status: DC
  Administered 2021-05-21 – 2021-05-28 (×8): 0.4 mg via ORAL
  Filled 2021-05-21 (×8): qty 1

## 2021-05-21 MED ORDER — DILTIAZEM HCL 25 MG/5ML IV SOLN
5.0000 mg | Freq: Once | INTRAVENOUS | Status: AC
  Administered 2021-05-21: 5 mg via INTRAVENOUS
  Filled 2021-05-21: qty 5

## 2021-05-21 MED ORDER — ACETAMINOPHEN 325 MG PO TABS: 650.0000 mg | ORAL_TABLET | Freq: Four times a day (QID) | ORAL | Status: DC | PRN

## 2021-05-21 MED ORDER — UMECLIDINIUM BROMIDE 62.5 MCG/ACT IN AEPB: 1.0000 | INHALATION_SPRAY | Freq: Every day | RESPIRATORY_TRACT | Status: DC

## 2021-05-21 MED ORDER — POLYETHYLENE GLYCOL 3350 17 G PO PACK
17.0000 g | PACK | Freq: Every day | ORAL | Status: DC
  Administered 2021-05-22 – 2021-05-29 (×5): 17 g via ORAL
  Filled 2021-05-21 (×5): qty 1

## 2021-05-21 MED ORDER — VENLAFAXINE HCL ER 75 MG PO CP24
150.0000 mg | ORAL_CAPSULE | Freq: Every day | ORAL | Status: DC
  Administered 2021-05-22 – 2021-05-29 (×8): 150 mg via ORAL
  Filled 2021-05-21 (×8): qty 2

## 2021-05-21 MED ORDER — DILTIAZEM HCL-DEXTROSE 125-5 MG/125ML-% IV SOLN (PREMIX)
5.0000 mg/h | INTRAVENOUS | Status: DC
  Administered 2021-05-21 – 2021-05-22 (×2): 5 mg/h via INTRAVENOUS
  Filled 2021-05-21 (×2): qty 125

## 2021-05-21 MED ORDER — ACETAMINOPHEN 650 MG RE SUPP: 650.0000 mg | Freq: Four times a day (QID) | RECTAL | Status: DC | PRN

## 2021-05-21 MED ORDER — HEPARIN BOLUS VIA INFUSION
3900.0000 [IU] | Freq: Once | INTRAVENOUS | Status: AC
  Administered 2021-05-21: 3900 [IU] via INTRAVENOUS

## 2021-05-21 MED ORDER — MUPIROCIN 2 % EX OINT
1.0000 "application " | TOPICAL_OINTMENT | Freq: Two times a day (BID) | CUTANEOUS | Status: AC
  Administered 2021-05-21 – 2021-05-26 (×10): 1 via NASAL
  Filled 2021-05-21: qty 22

## 2021-05-21 MED ORDER — FUROSEMIDE 10 MG/ML IJ SOLN
40.0000 mg | Freq: Once | INTRAMUSCULAR | Status: AC
  Administered 2021-05-21: 40 mg via INTRAVENOUS
  Filled 2021-05-21: qty 4

## 2021-05-21 MED ORDER — SODIUM CHLORIDE 0.9 % IV SOLN
2.0000 g | INTRAVENOUS | Status: DC
  Administered 2021-05-22 – 2021-05-24 (×3): 2 g via INTRAVENOUS
  Filled 2021-05-21 (×3): qty 20

## 2021-05-21 MED ORDER — TIOTROPIUM BROMIDE MONOHYDRATE 18 MCG IN CAPS: 1.0000 | ORAL_CAPSULE | Freq: Every day | RESPIRATORY_TRACT | Status: DC

## 2021-05-21 MED ORDER — HEPARIN (PORCINE) 25000 UT/250ML-% IV SOLN
1200.0000 [IU]/h | INTRAVENOUS | Status: DC
  Administered 2021-05-21: 19:00:00 900 [IU]/h via INTRAVENOUS
  Administered 2021-05-22 – 2021-05-25 (×4): 1200 [IU]/h via INTRAVENOUS
  Filled 2021-05-21 (×5): qty 250

## 2021-05-21 MED ORDER — BISACODYL 5 MG PO TBEC: 5.0000 mg | DELAYED_RELEASE_TABLET | Freq: Every day | ORAL | Status: DC | PRN

## 2021-05-21 MED ORDER — FLUTICASONE PROPIONATE 50 MCG/ACT NA SUSP
1.0000 | Freq: Every day | NASAL | Status: DC
  Administered 2021-05-22 – 2021-05-29 (×8): 1 via NASAL
  Filled 2021-05-21: qty 16

## 2021-05-21 NOTE — Progress Notes (Signed)
ANTICOAGULATION CONSULT NOTE - Initial Consult ? ?Pharmacy Consult for Heparin ?Indication: atrial fibrillation ? ?Allergies  ?Allergen Reactions  ? Demerol [Meperidine Hcl]   ?  Agitation  ? Morphine And Related   ?  No reaction listed on nursing home record  ? ? ?Patient Measurements: ?Height: '5\' 5"'$  (165.1 cm) ?Weight: 63.5 kg (140 lb) ?IBW/kg (Calculated) : 57 ?HEPARIN DW (KG): 63.5  ? ?Vital Signs: ?Temp: 97.7 ?F (36.5 ?C) (03/13 1257) ?Temp Source: Oral (03/13 1257) ?BP: 104/77 (03/13 1600) ?Pulse Rate: 122 (03/13 1600) ? ?Labs: ?Recent Labs  ?  05/21/21 ?1315 05/21/21 ?1501  ?HGB 12.9  --   ?HCT 40.3  --   ?PLT 232  --   ?LABPROT 15.8*  --   ?INR 1.3*  --   ?CREATININE 0.74  --   ?TROPONINIHS 17 15  ? ? ?Estimated Creatinine Clearance: 54.7 mL/min (by C-G formula based on SCr of 0.74 mg/dL). ? ? ?Medical History: ?Past Medical History:  ?Diagnosis Date  ? Anxiety   ? Diabetes mellitus without complication ([place])   ? GERD (gastroesophageal reflux disease)   ? Hyperlipidemia   ? Hypertension   ? Retention of urine, unspecified   ? 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 ? ?Medications:  ?See med rec ? ?Assessment: ?76 y.o. female with a hx of HTN, HLD and DMII who is being seen 05/21/2021 for the evaluation of Afib with RVR, new onset. Patient is not on any oral anticoagulants. Pharmacy asked to start heparin ? ?Goal of Therapy:  ?Heparin level 0.3-0.7 units/ml ?Monitor platelets by anticoagulation protocol: Yes ?  ?Plan:  ?Give 3900 units bolus x 1 ?Start heparin infusion at 900 units/hr ?Check anti-Xa level in ~8 hours and daily while on heparin ?Continue to monitor H&H and platelets ? ?Isac Sarna, BS Pharm D, BCPS ?Clinical Pharmacist ?Pager [phone] ?05/21/2021,5:01 PM ? ? ?

## 2021-05-21 NOTE — ED Triage Notes (Signed)
Pt arrived via [facility] from [facility] c/o SOB and low oxygen saturation of 92 on room air at facility. Per EMS, pt is also tachycardiac 130s ?

## 2021-05-21 NOTE — H&P (Signed)
History and Physical    Patient: Krystal Ali WCB:[id number] DOB: 11-Jul-1945 DOA: 05/21/2021 DOS: the patient was seen and examined on 05/21/2021 PCP: Hal Morales, DO  Patient coming from: SNF  Chief Complaint: shortness of breath  HPI: Krystal Ali is a 76 y.o. female with medical history significant of anxiety, type 2 diabetes, GERD, HLD, HTN, urinary retention, chronic back pain.  Patient presents from SNF on 05/21/21 due to shortness of breath worsening over the last couple of days.  She states that she does not typically require oxygen at baseline.  She lives at a nursing home currently and is bedbound/wheelchair-bound. She denies chest pain, nausea/vomiting, abdominal pain, diaphoresis. Endorses feeling tired and difficulty catching her breath. She otherwise feels like her normal self. EMS was called to her nursing home today for shortness of breath and her saturation was 92 on room air and heart rate 130, per her report. Patient tells me she thinks she is here for treatment of her UTI.  Denies urinary symptoms currently.  She is oriented to self, place, year. Asked me if atrial fibrillation will kill her.   In the ED: Significant findings are: Urinalysis today showed positive nitrites, large leukocytes, >50WBC, many bacteria, 11-20 RBC/HPF. Overall normal electrolyte panel, Cr. 0.74, LA 1.4, normal CBC, troponin 17, TSH 2.946 Chest xray incidental finding-  Right paratracheal soft tissue fullness is present on the prior exam. This may be due to prominent great vessels and patient obliquity, given lack of right paratracheal mass or thyroid enlargement on the 06/08/2020 thoracic spine CT/myelogram. Consider follow-up nonemergent PA and lateral radiographs.  They received diltiazem bolus plus infusion for Afib with improvement in HR to low 100s. CTX for UTI and 1L bolus.  Review of Systems: As mentioned in the history of present illness. All other systems  reviewed and are negative. Past Medical History:  Diagnosis Date   Anxiety    Diabetes mellitus without complication ([place])    GERD (gastroesophageal reflux disease)    Hyperlipidemia    Hypertension    Retention of urine, unspecified    Spinal stenosis of lumbar region without neurogenic claudication    Past Surgical History:  Procedure Laterality Date   APPENDECTOMY     BACK SURGERY  08/2020   BIOPSY  09/20/2020   Procedure: BIOPSY;  Surgeon: Harvel Quale, MD;  [facility];  Service: Gastroenterology;;   BREAST SURGERY     CHOLECYSTECTOMY     COLONOSCOPY WITH PROPOFOL N/A 09/20/2020   Procedure: COLONOSCOPY WITH PROPOFOL;  Surgeon: Harvel Quale, MD;  [facility];  Service: Gastroenterology;  Laterality: N/A;   ESOPHAGOGASTRODUODENOSCOPY (EGD) WITH PROPOFOL N/A 09/20/2020   Procedure: ESOPHAGOGASTRODUODENOSCOPY (EGD) WITH PROPOFOL;  Surgeon: Harvel Quale, MD;  [facility];  Service: Gastroenterology;  Laterality: N/A;   POLYPECTOMY  09/20/2020   Procedure: POLYPECTOMY;  Surgeon: Harvel Quale, MD;  [facility];  Service: Gastroenterology;;   Social History:  reports that she has never smoked. She has never used smokeless tobacco. She reports that she does not currently use alcohol. She reports that she does not use drugs.  Allergies  Allergen Reactions   Demerol [Meperidine Hcl]     Agitation   Morphine And Related     No reaction listed on nursing home record    Family History  Problem Relation Age of Onset   Heart disease Father    COPD Brother     Prior to Admission  medications   Medication Sig Start Date End Date Taking? Authorizing Provider  acetaminophen (TYLENOL) 325 MG tablet Take 2 tablets (650 mg total) by mouth every 6 (six) hours as needed for mild pain (or Fever >/= 101). 09/23/20  Yes Emokpae, Courage, MD  diclofenac Sodium (VOLTAREN) 1 % GEL Apply 2 g topically in  the morning and at bedtime.   Yes [provider]  ferrous sulfate 325 (65 FE) MG tablet Take 325 mg by mouth daily with breakfast.   Yes [provider]  fluticasone (FLONASE) 50 MCG/ACT nasal spray Place 1 spray into both nostrils 2 (two) times daily. 05/03/21  Yes [provider]  gabapentin (NEURONTIN) 300 MG capsule Take 600 mg by mouth 3 (three) times daily.   Yes [provider]  HYDROcodone-acetaminophen (NORCO) 7.5-325 MG tablet Take 1 tablet by mouth every 6 (six) hours as needed for moderate pain.   Yes [provider]  ipratropium-albuterol (DUONEB) 0.5-2.5 (3) MG/3ML SOLN Take 3 mLs by nebulization every 6 (six) hours as needed (SOB).   Yes [provider]  Multiple Vitamin (MULTIVITAMIN WITH MINERALS) TABS tablet Take 1 tablet by mouth daily.   Yes [provider]  pantoprazole (PROTONIX) 40 MG tablet Take 1 tablet (40 mg total) by mouth 2 (two) times daily. 09/23/20 09/23/21 Yes Emokpae, Courage, MD  polyethylene glycol (MIRALAX / GLYCOLAX) 17 g packet Take 17 g by mouth daily.   Yes [provider]  SPIRIVA HANDIHALER 18 MCG inhalation capsule Place 1 capsule into inhaler and inhale daily. 05/03/21  Yes [provider]  tamsulosin (FLOMAX) 0.4 MG CAPS capsule Take 1 capsule (0.4 mg total) by mouth daily after supper. 09/23/20  Yes Roxan Hockey, MD  venlafaxine XR (EFFEXOR-XR) 150 MG 24 hr capsule Take 1 capsule (150 mg total) by mouth daily with breakfast. 07/18/20  Yes [name], NP  blood glucose meter kit and supplies Dispense based on patient and insurance preference. Use up to four times daily as directed. (FOR ICD-10 E10.9, E11.9). 02/15/20   [name], NP  polyethylene glycol-electrolytes (TRILYTE) 420 g solution Take 4,000 mLs by mouth as directed. Patient not taking: Reported on 05/21/2021 01/10/21   Montez Morita, Quillian Quince, MD  polyethylene glycol-electrolytes (TRILYTE) 420 g solution  Take 4,000 mLs by mouth as directed. Patient not taking: Reported on 05/21/2021 01/31/21   Harvel Quale, MD    Physical Exam: Vitals:   05/21/21 1530 05/21/21 1600 05/21/21 1655 05/21/21 1700  BP: 103/73 104/77    Pulse: (!) 119 (!) 122    Resp: (!) 23 17 (!) 26   Temp:      TempSrc:      SpO2: 95% 99%    Weight:    63.5 kg  Height:    5' 5"  (1.651 m)   Physical Exam Vitals and nursing note reviewed.  Constitutional:      General: She is not in acute distress.    Appearance: She is obese. She is not toxic-appearing or diaphoretic.  HENT:     Mouth/Throat:     Mouth: Mucous membranes are moist.  Cardiovascular:     Rate and Rhythm: Tachycardia present. Rhythm irregular.     Pulses: Normal pulses.     Comments: Cool lower extremities Pulmonary:     Effort: Pulmonary effort is normal. No respiratory distress.     Breath sounds: Normal breath sounds. No wheezing or rales.  Chest:     Chest wall: No tenderness.  Abdominal:     General: There is no distension.     Palpations: Abdomen is soft.     Tenderness: There is no abdominal tenderness.  Musculoskeletal:     Cervical back: Neck supple.     Right lower leg: No edema.     Left lower leg: No edema.  Skin:    General: Skin is dry.     Capillary Refill: Capillary refill takes less than 2 seconds.  Neurological:     General: No focal deficit present.     Mental Status: She is alert and oriented to person, place, and time. Mental status is at baseline.  Psychiatric:        Mood and Affect: Mood normal.        Behavior: Behavior normal.    Data Reviewed: Urinalysis today showed positive nitrites, large leukocytes, >50WBC, many bacteria, 11-20 RBC/HPF. Overall normal electrolyte panel, Cr. 0.74, LA 1.4, normal CBC, troponin 17, TSH 2.946  Assessment and Plan: Krystal Ali is a 76 y.o. female with medical history significant of anxiety, type 2 diabetes, GERD, HLD, HTN, urinary retention, chronic back  pain.  New onset Atrial fibrillation RVR- HR initially 130s and improved to low 100s with diltiazem. - cardiology consulted, appreciate recommendations - echo - BMP, CBC am - continue diltiazem - anticoagulation initiated - wean oxygen as tolerated  Type II DM- recent hgb A1c 5.5 - monitor blood sugars on regular labs  Anxiety- chronic, well controlled - continue home venlafaxine  Frequent UTIs   hematuria- per her most recent urology appointment note 2/23, patient was treated with IV Abx for UTI and was to complete a repeat UxCx to ensure resolution of infection. She was to continue flomax and have repeat CT hematuria 3/30 followed by cystoscopy. Urinalysis today showed positive nitrites, large leukocytes, >50WBC, many bacteria, 11-20 RBC/HPF. She is asymptomatic. - continue IV Abx - f/u urine culture - continue home flomax  Home medications include duonebs and spiriva but do not see dx of asthma or COPD - continue home breathing treatments - wean oxygen if able   Advance Care Planning:   Code Status: Full Code   Consults: cardiology  Family Communication: none  Severity of Illness: The appropriate patient status for this patient is INPATIENT. Inpatient status is judged to be reasonable and necessary in order to provide the required intensity of service to ensure the patient's safety. The patient's presenting symptoms, physical exam findings, and initial radiographic and laboratory data in the context of their chronic comorbidities is felt to place them at high risk for further clinical deterioration. Furthermore, it is not anticipated that the patient will be medically stable for discharge from the hospital within 2 midnights of admission.   * I certify that at the point of admission it is my clinical judgment that the patient will require inpatient hospital care spanning beyond 2 midnights from the point of admission due to high intensity of service, high risk for further  deterioration and high frequency of surveillance required.*  Author: Richarda Osmond, MD 05/21/2021 5:40 PM  For on call review [email or web address].

## 2021-05-21 NOTE — ED Provider Notes (Incomplete)
[facility] Provider Note   CSN: [id number] Arrival date & time: 05/21/21  1253     History {Add pertinent medical, surgical, social history, OB history to HPI:1} No chief complaint on file.   Krystal Ali is a 76 y.o. female.  Patient with hypertension and diabetes and complains of shortness of breath   Shortness of Breath     Home Medications Prior to Admission medications   Medication Sig Start Date End Date Taking? Authorizing Provider  acetaminophen (TYLENOL) 325 MG tablet Take 2 tablets (650 mg total) by mouth every 6 (six) hours as needed for mild pain (or Fever >/= 101). 09/23/20  Yes Emokpae, Courage, MD  diclofenac Sodium (VOLTAREN) 1 % GEL Apply 2 g topically in the morning and at bedtime.   Yes [provider]  ferrous sulfate 325 (65 FE) MG tablet Take 325 mg by mouth daily with breakfast.   Yes [provider]  fluticasone (FLONASE) 50 MCG/ACT nasal spray Place 1 spray into both nostrils 2 (two) times daily. 05/03/21  Yes [provider]  gabapentin (NEURONTIN) 300 MG capsule Take 600 mg by mouth 3 (three) times daily.   Yes [provider]  HYDROcodone-acetaminophen (NORCO) 7.5-325 MG tablet Take 1 tablet by mouth every 6 (six) hours as needed for moderate pain.   Yes [provider]  ipratropium-albuterol (DUONEB) 0.5-2.5 (3) MG/3ML SOLN Take 3 mLs by nebulization every 6 (six) hours as needed (SOB).   Yes [provider]  Multiple Vitamin (MULTIVITAMIN WITH MINERALS) TABS tablet Take 1 tablet by mouth daily.   Yes [provider]  pantoprazole (PROTONIX) 40 MG tablet Take 1 tablet (40 mg total) by mouth 2 (two) times daily. 09/23/20 09/23/21 Yes Emokpae, Courage, MD  polyethylene glycol (MIRALAX / GLYCOLAX) 17 g packet Take 17 g by mouth daily.   Yes [provider]  SPIRIVA HANDIHALER 18 MCG inhalation capsule Place 1 capsule into inhaler and inhale daily. 05/03/21  Yes  [provider]  tamsulosin (FLOMAX) 0.4 MG CAPS capsule Take 1 capsule (0.4 mg total) by mouth daily after supper. 09/23/20  Yes Roxan Hockey, MD  venlafaxine XR (EFFEXOR-XR) 150 MG 24 hr capsule Take 1 capsule (150 mg total) by mouth daily with breakfast. 07/18/20  Yes [name], NP  blood glucose meter kit and supplies Dispense based on patient and insurance preference. Use up to four times daily as directed. (FOR ICD-10 E10.9, E11.9). 02/15/20   [name], NP  polyethylene glycol-electrolytes (TRILYTE) 420 g solution Take 4,000 mLs by mouth as directed. Patient not taking: Reported on 05/21/2021 01/10/21   Montez Morita, Quillian Quince, MD  polyethylene glycol-electrolytes (TRILYTE) 420 g solution Take 4,000 mLs by mouth as directed. Patient not taking: Reported on 05/21/2021 01/31/21   Harvel Quale, MD      Allergies    Demerol [meperidine hcl] and Morphine and related    Review of Systems   Review of Systems  Respiratory:  Positive for shortness of breath.    Physical Exam Updated Vital Signs BP 125/89    Pulse (!) 108    Temp 97.7 F (36.5 C) (Oral)    Resp (!) 27    SpO2 99%  Physical Exam  ED Results / Procedures / Treatments   Labs (all labs ordered are listed, but only abnormal results are displayed) Labs Reviewed  COMPREHENSIVE METABOLIC PANEL - Abnormal; Notable for the following components:      Result Value  Glucose, Bld 138 (*)    BUN 26 (*)    Calcium 8.5 (*)    AST 14 (*)    All other components within normal limits  CBC WITH DIFFERENTIAL/PLATELET - Abnormal; Notable for the following components:   MCV 103.3 (*)    All other components within normal limits  PROTIME-INR - Abnormal; Notable for the following components:   Prothrombin Time 15.8 (*)    INR 1.3 (*)    All other components within normal limits  URINALYSIS, ROUTINE W REFLEX MICROSCOPIC - Abnormal; Notable for the following components:   APPearance TURBID (*)    Hgb  urine dipstick MODERATE (*)    Protein, ur 100 (*)    Nitrite POSITIVE (*)    Leukocytes,Ua LARGE (*)    WBC, UA >50 (*)    Bacteria, UA MANY (*)    Non Squamous Epithelial 0-5 (*)    All other components within normal limits  CULTURE, BLOOD (ROUTINE X 2)  CULTURE, BLOOD (ROUTINE X 2)  LACTIC ACID, PLASMA  TROPONIN I (HIGH SENSITIVITY)  TROPONIN I (HIGH SENSITIVITY)    EKG None  Radiology DG Chest Port 1 View  Result Date: 05/21/2021 CLINICAL DATA:  Sepsis. EXAM: PORTABLE CHEST 1 VIEW COMPARISON:  09/08/2020 FINDINGS: Thoracolumbar spine fixation. Cervical spine fixation. Patient rotated left. Right paratracheal soft tissue fullness is present on the prior exam, suboptimally evaluated. Moderate cardiomegaly. Moderate hiatal hernia. Probable layering small bilateral pleural effusions. Mild interstitial edema. Breast tissue overlies the left greater than right lung bases. Concurrent bibasilar airspace disease. IMPRESSION: Limited exam secondary to patient body habitus, AP portable technique, and obliquity. Cardiomegaly with mild congestive heart failure and layering bilateral pleural effusions. Probable bibasilar airspace disease. Either atelectasis or concurrent infection. Hiatal hernia. Right paratracheal soft tissue fullness is present on the prior exam. This may be due to prominent great vessels and patient obliquity, given lack of right paratracheal mass or thyroid enlargement on the 06/08/2020 thoracic spine CT/myelogram. Consider follow-up nonemergent PA and lateral radiographs. Electronically Signed   By: Abigail Miyamoto M.D.   On: 05/21/2021 13:35    Procedures Procedures  {Document cardiac monitor, telemetry assessment procedure when appropriate:1}  Medications Ordered in ED Medications  diltiazem (CARDIZEM) 125 mg in dextrose 5% 125 mL (1 mg/mL) infusion (5 mg/hr Intravenous New Bag/Given 05/21/21 1346)  cefTRIAXone (ROCEPHIN) 2 g in sodium chloride 0.9 % 100 mL IVPB (2 g  Intravenous New Bag/Given 05/21/21 1531)  sodium chloride 0.9 % bolus 1,000 mL (1,000 mLs Intravenous Bolus 05/21/21 1325)  diltiazem (CARDIZEM) injection 5 mg (5 mg Intravenous Given 05/21/21 1411)    ED Course/ Medical Decision Making/ A&P  CRITICAL CARE Performed by: Milton Ferguson Total critical care time: 40 minutes Critical care time was exclusive of separately billable procedures and treating other patients. Critical care was necessary to treat or prevent imminent or life-threatening deterioration. Critical care was time spent personally by me on the following activities: development of treatment plan with patient and/or surrogate as well as nursing, discussions with consultants, evaluation of patient's response to treatment, examination of patient, obtaining history from patient or surrogate, ordering and performing treatments and interventions, ordering and review of laboratory studies, ordering and review of radiographic studies, pulse oximetry and re-evaluation of patient's condition.                          Medical Decision Making Amount and/or Complexity of Data Reviewed Labs: ordered. Radiology: ordered.  Risk Prescription drug management. Decision regarding hospitalization.   Patient with new onset atrial fibs.  She will be admitted to medicine with cardiology consult  {Document critical care time when appropriate:1} {Document review of labs and clinical decision tools ie heart score, Chads2Vasc2 etc:1}  {Document your independent review of radiology images, and any outside records:1} {Document your discussion with family members, caretakers, and with consultants:1} {Document social determinants of health affecting pt's care:1} {Document your decision making why or why not admission, treatments were needed:1} Final Clinical Impression(s) / ED Diagnoses Final diagnoses:  Atrial fibrillation, unspecified type ([place])    Rx / DC Orders ED Discharge Orders     None

## 2021-05-21 NOTE — Plan of Care (Signed)

## 2021-05-21 NOTE — Consult Note (Signed)
Cardiology Consultation:   Patient ID: Krystal Ali MRN: [id number]; DOB: 02-25-1946  Admit date: 05/21/2021 Date of Consult: 05/21/2021  PCP:  Hal Morales, DO   [facility] Providers Cardiologist:  None   {     Patient Profile:   Krystal Ali is a 76 y.o. female with a hx of HTN, HLD, urinary retention and DMII who is being seen 05/21/2021 for the evaluation of Afib with RVR at the request of Dr. Roderic Palau.  History of Present Illness:   Krystal Ali is a 76 year old female with no known cardiac history who presented to the ER from SNF due to progressive SOB. Specifically, the patient states that over the past several days she has been feeling more dyspneic. No associated chest pain, lightheadedness, dizziness or palpitations. She was noted to be mildly hypoxic with HR 130s at her nursing home prompting her to be brought to the ER for further evaluation.  Upon arrival here, the patient was noted to be in Afib with RVR with HR 130s. UA grossly positive. Trop 17>15, TSH normal, lactate normal. CXR with evidence of pulmonary vascular congestion. She was started on dilt gtt with improvement of HR as well as heparin for AC. Cardiology is consulted for further management of Afib.  Currently, the patient feels better. Reports continued mild SOB. No chest pain or palpitations. Has some mild orthopnea.   Past Medical History:  Diagnosis Date   Anxiety    Diabetes mellitus without complication ([place])    GERD (gastroesophageal reflux disease)    Hyperlipidemia    Hypertension    Retention of urine, unspecified    Spinal stenosis of lumbar region without neurogenic claudication     Past Surgical History:  Procedure Laterality Date   APPENDECTOMY     BACK SURGERY  08/2020   BIOPSY  09/20/2020   Procedure: BIOPSY;  Surgeon: Harvel Quale, MD;  [facility];  Service: Gastroenterology;;   BREAST SURGERY     CHOLECYSTECTOMY     COLONOSCOPY WITH  PROPOFOL N/A 09/20/2020   Procedure: COLONOSCOPY WITH PROPOFOL;  Surgeon: Harvel Quale, MD;  Location: [facility];  Service: Gastroenterology;  Laterality: N/A;   ESOPHAGOGASTRODUODENOSCOPY (EGD) WITH PROPOFOL N/A 09/20/2020   Procedure: ESOPHAGOGASTRODUODENOSCOPY (EGD) WITH PROPOFOL;  Surgeon: Harvel Quale, MD;  Location[facility];  Service: Gastroenterology;  Laterality: N/A;   POLYPECTOMY  09/20/2020   Procedure: POLYPECTOMY;  Surgeon: Harvel Quale, MD;  Location[facility];  Service: Gastroenterology;;     Home Medications:  Prior to Admission medications   Medication Sig Start Date End Date Taking? Authorizing Provider  acetaminophen (TYLENOL) 325 MG tablet Take 2 tablets (650 mg total) by mouth every 6 (six) hours as needed for mild pain (or Fever >/= 101). 09/23/20  Yes Emokpae, Courage, MD  diclofenac Sodium (VOLTAREN) 1 % GEL Apply 2 g topically in the morning and at bedtime.   Yes [provider]  ferrous sulfate 325 (65 FE) MG tablet Take 325 mg by mouth daily with breakfast.   Yes [provider]  fluticasone (FLONASE) 50 MCG/ACT nasal spray Place 1 spray into both nostrils 2 (two) times daily. 05/03/21  Yes [provider]  gabapentin (NEURONTIN) 300 MG capsule Take 600 mg by mouth 3 (three) times daily.   Yes [provider]  HYDROcodone-acetaminophen (NORCO) 7.5-325 MG tablet Take 1 tablet by mouth every 6 (six) hours as needed for moderate pain.   Yes [provider]  ipratropium-albuterol (DUONEB) 0.5-2.5 (3) MG/3ML SOLN Take 3 mLs by nebulization every 6 (six) hours as needed (SOB).   Yes [provider]  Multiple Vitamin (MULTIVITAMIN WITH MINERALS) TABS tablet Take 1 tablet by mouth daily.   Yes [provider]  pantoprazole (PROTONIX) 40 MG tablet Take 1 tablet (40 mg total) by mouth 2 (two) times daily. 09/23/20 09/23/21 Yes Emokpae, Courage, MD  polyethylene glycol  (MIRALAX / GLYCOLAX) 17 g packet Take 17 g by mouth daily.   Yes [provider]  SPIRIVA HANDIHALER 18 MCG inhalation capsule Place 1 capsule into inhaler and inhale daily. 05/03/21  Yes [provider]  tamsulosin (FLOMAX) 0.4 MG CAPS capsule Take 1 capsule (0.4 mg total) by mouth daily after supper. 09/23/20  Yes Roxan Hockey, MD  venlafaxine XR (EFFEXOR-XR) 150 MG 24 hr capsule Take 1 capsule (150 mg total) by mouth daily with breakfast. 07/18/20  Yes [name], NP  blood glucose meter kit and supplies Dispense based on patient and insurance preference. Use up to four times daily as directed. (FOR ICD-10 E10.9, E11.9). 02/15/20   [name], NP  polyethylene glycol-electrolytes (TRILYTE) 420 g solution Take 4,000 mLs by mouth as directed. Patient not taking: Reported on 05/21/2021 01/10/21   Montez Morita, Quillian Quince, MD  polyethylene glycol-electrolytes (TRILYTE) 420 g solution Take 4,000 mLs by mouth as directed. Patient not taking: Reported on 05/21/2021 01/31/21   Harvel Quale, MD    Inpatient Medications: Scheduled Meds:  Continuous Infusions:  cefTRIAXone (ROCEPHIN)  IV 2 g (05/21/21 1531)   diltiazem (CARDIZEM) infusion 5 mg/hr (05/21/21 1346)   PRN Meds:   Allergies:    Allergies  Allergen Reactions   Demerol [Meperidine Hcl]     Agitation   Morphine And Related     No reaction listed on nursing home record    Social History:   Social History   Socioeconomic History   Marital status: Divorced    Spouse name: Not on file   Number of children: Not on file   Years of education: Not on file   Highest education level: Not on file  Occupational History   Not on file  Tobacco Use   Smoking status: Never   Smokeless tobacco: Never  Vaping Use   Vaping Use: Never used  Substance and Sexual Activity   Alcohol use: Not Currently   Drug use: Never   Sexual activity: Not Currently  Other Topics Concern   Not on file  Social  History Narrative   Lives at [facility] Determinants of Health   Financial Resource Strain: Not on file  Food Insecurity: Not on file  Transportation Needs: Not on file  Physical Activity: Not on file  Stress: Not on file  Social Connections: Not on file  Intimate Partner Violence: Not on file    Family History:    Family History  Problem Relation Age of Onset   Heart disease Father    COPD Brother      ROS:  Please see the history of present illness.  Review of Systems  Constitutional:  Positive for malaise/fatigue.  Respiratory:  Positive for shortness of breath.   Cardiovascular:  Positive for orthopnea. Negative for chest pain, palpitations, claudication, leg swelling and PND.  Gastrointestinal:  Positive for abdominal pain. Negative for nausea.  Genitourinary:  Positive for dysuria.  Musculoskeletal:  Negative for falls.  Neurological:  Negative for dizziness and loss of consciousness.  Physical Exam/Data:   Vitals:   05/21/21 1330 05/21/21 1345 05/21/21 1420 05/21/21 1450  BP: (!) 118/101 115/70 (!) 112/96 125/89  Pulse: (!) 135 (!) 133 96 (!) 108  Resp: (!) 31 (!) 28 (!) 30 (!) 27  Temp:      TempSrc:      SpO2: 100% 100%  99%   No intake or output data in the 24 hours ending 05/21/21 1534 Last 3 Weights 05/03/2021 09/20/2020 09/18/2020  Weight (lbs) 140 lb (No Data) 140 lb 10.5 oz  Weight (kg) 63.504 kg (No Data) 63.8 kg     There is no height or weight on file to calculate BMI.  General:  Comfortable, laying in bed HEENT: normal Neck: mildly elevated JVD Cardiac:  Irregular, tachycardic, no murmurs Lungs:  Crackles at the bases Abd: soft, nontender, no hepatomegaly  Ext: no edema Musculoskeletal:  No deformities, BUE and BLE strength normal and equal Skin: mildly cool on lower legs Neuro:  CNs 2-12 intact, no focal abnormalities noted Psych:  Normal affect   EKG:  The EKG was personally reviewed and demonstrates:  Afib with RVR with HR  130 Telemetry:  Telemetry was personally reviewed and demonstrates:  Afib with HR 100-130s; currently 105  Relevant CV Studies: TTE ordered  Laboratory Data:  High Sensitivity Troponin:   Recent Labs  Lab 05/21/21 1315  TROPONINIHS 17     Chemistry Recent Labs  Lab 05/21/21 1315  NA 137  K 4.1  CL 102  CO2 25  GLUCOSE 138*  BUN 26*  CREATININE 0.74  CALCIUM 8.5*  GFRNONAA >60  ANIONGAP 10    Recent Labs  Lab 05/21/21 1315  PROT 6.7  ALBUMIN 3.5  AST 14*  ALT 14  ALKPHOS 82  BILITOT 0.6   Lipids No results for input(s): CHOL, TRIG, HDL, LABVLDL, LDLCALC, CHOLHDL in the last 168 hours.  Hematology Recent Labs  Lab 05/21/21 1315  WBC 5.2  RBC 3.90  HGB 12.9  HCT 40.3  MCV 103.3*  MCH 33.1  MCHC 32.0  RDW 12.7  PLT 232   Thyroid No results for input(s): TSH, FREET4 in the last 168 hours.  BNPNo results for input(s): BNP, PROBNP in the last 168 hours.  DDimer No results for input(s): DDIMER in the last 168 hours.   Radiology/Studies:  DG Chest Port 1 View  Result Date: 05/21/2021 CLINICAL DATA:  Sepsis. EXAM: PORTABLE CHEST 1 VIEW COMPARISON:  09/08/2020 FINDINGS: Thoracolumbar spine fixation. Cervical spine fixation. Patient rotated left. Right paratracheal soft tissue fullness is present on the prior exam, suboptimally evaluated. Moderate cardiomegaly. Moderate hiatal hernia. Probable layering small bilateral pleural effusions. Mild interstitial edema. Breast tissue overlies the left greater than right lung bases. Concurrent bibasilar airspace disease. IMPRESSION: Limited exam secondary to patient body habitus, AP portable technique, and obliquity. Cardiomegaly with mild congestive heart failure and layering bilateral pleural effusions. Probable bibasilar airspace disease. Either atelectasis or concurrent infection. Hiatal hernia. Right paratracheal soft tissue fullness is present on the prior exam. This may be due to prominent great vessels and patient  obliquity, given lack of right paratracheal mass or thyroid enlargement on the 06/08/2020 thoracic spine CT/myelogram. Consider follow-up nonemergent PA and lateral radiographs. Electronically Signed   By: Abigail Miyamoto M.D.   On: 05/21/2021 13:35     Assessment and Plan:   #Afib with RVR: CHADs-vasc 5. Newly diagnosed. Patient presented with progressive SOB found to be in Afib with RVR with HR 130s in the setting  of a UTI. Was initiated on dilt gtt with improvement of rates and heparin gtt for [facility].  -Continue dilt gtt for now -Continue heparin gtt; ultimately plan to transition to apixaban longterm -Check TTE -TSH normal  #Suspected HF: Patient mildly volume overloaded on exam with evidence of pulmonary edema on CXR concerning for acute HF exacerbation. Likely precipitated by Afib with RVR as above. TTE pending.  -Check TTE -Check BNP -Give lasix 83m IV x1 dose and monitor response -Will add GDMT as tolerated pending TTE findings -Monitor I/Os and daily weights  #UTI: -Management per IM   Risk Assessment/Risk Scores:        New York Heart Association (NYHA) Functional Class NYHA Class III  CHA2DS2-VASc Score =   5            For questions or updates, please contact [facility] Please consult [email or web address] for contact info under    Signed, HFreada Bergeron MD  05/21/2021 3:34 PM

## 2021-05-22 ENCOUNTER — Inpatient Hospital Stay (HOSPITAL_COMMUNITY): Payer: Medicare (Managed Care)

## 2021-05-22 DIAGNOSIS — E119 Type 2 diabetes mellitus without complications: Secondary | ICD-10-CM | POA: Diagnosis not present

## 2021-05-22 DIAGNOSIS — I4891 Unspecified atrial fibrillation: Secondary | ICD-10-CM | POA: Diagnosis not present

## 2021-05-22 DIAGNOSIS — N390 Urinary tract infection, site not specified: Secondary | ICD-10-CM | POA: Insufficient documentation

## 2021-05-22 DIAGNOSIS — J9601 Acute respiratory failure with hypoxia: Secondary | ICD-10-CM

## 2021-05-22 DIAGNOSIS — R8281 Pyuria: Secondary | ICD-10-CM

## 2021-05-22 DIAGNOSIS — I509 Heart failure, unspecified: Secondary | ICD-10-CM

## 2021-05-22 DIAGNOSIS — I5021 Acute systolic (congestive) heart failure: Secondary | ICD-10-CM

## 2021-05-22 DIAGNOSIS — M48062 Spinal stenosis, lumbar region with neurogenic claudication: Secondary | ICD-10-CM

## 2021-05-22 LAB — BASIC METABOLIC PANEL
Anion gap: 9 (ref 5–15)
BUN: 24 mg/dL — ABNORMAL HIGH (ref 8–23)
CO2: 26 mmol/L (ref 22–32)
Calcium: 8.4 mg/dL — ABNORMAL LOW (ref 8.9–10.3)
Chloride: 103 mmol/L (ref 98–111)
Creatinine, Ser: 0.81 mg/dL (ref 0.44–1.00)
GFR, Estimated: >60 mL/min (ref >60)
Glucose, Bld: 128 mg/dL — ABNORMAL HIGH (ref 70–99)
Potassium: 4 mmol/L (ref 3.5–5.1)
Sodium: 138 mmol/L (ref 135–145)

## 2021-05-22 LAB — CBC
HCT: 39.7 % (ref 36.0–46.0)
Hemoglobin: 12.4 g/dL (ref 12.0–15.0)
MCH: 32.5 pg (ref 26.0–34.0)
MCHC: 31.2 g/dL (ref 30.0–36.0)
MCV: 104.2 fL — ABNORMAL HIGH (ref 80.0–100.0)
Platelets: 192 10*3/uL (ref 150–400)
RBC: 3.81 MIL/uL — ABNORMAL LOW (ref 3.87–5.11)
RDW: 12.7 % (ref 11.5–15.5)
WBC: 4.7 10*3/uL (ref 4.0–10.5)
nRBC: 0 % (ref 0.0–0.2)

## 2021-05-22 LAB — HEPARIN LEVEL (UNFRACTIONATED)
Heparin Unfractionated: 0.28 IU/mL — ABNORMAL LOW (ref 0.30–0.70)
Heparin Unfractionated: 0.17 IU/mL — ABNORMAL LOW (ref 0.30–0.70)
Heparin Unfractionated: 0.38 IU/mL (ref 0.30–0.70)

## 2021-05-22 LAB — ECHOCARDIOGRAM COMPLETE
AR max vel: 2.99 cm2
AV Area VTI: 2.57 cm2
AV Area mean vel: 2.68 cm2
AV Mean grad: 1.7 mmHg
AV Peak grad: 3.2 mmHg
Ao pk vel: 0.89 m/s
Area-P 1/2: 4.15 cm2
Height: 65 in
MV VTI: 2.43 cm2
S' Lateral: 4.8 cm
Weight: 3012.37 oz

## 2021-05-22 LAB — BRAIN NATRIURETIC PEPTIDE: B Natriuretic Peptide: 269 pg/mL — ABNORMAL HIGH (ref 0.0–100.0)

## 2021-05-22 MED ORDER — HYDROCODONE-ACETAMINOPHEN 5-325 MG PO TABS
1.0000 | ORAL_TABLET | Freq: Four times a day (QID) | ORAL | Status: DC | PRN
  Administered 2021-05-22 – 2021-05-28 (×6): 1 via ORAL
  Filled 2021-05-22 (×6): qty 1

## 2021-05-22 MED ORDER — AMIODARONE HCL IN DEXTROSE 360-4.14 MG/200ML-% IV SOLN
30.0000 mg/h | INTRAVENOUS | Status: DC
  Administered 2021-05-22 – 2021-05-28 (×9): 30 mg/h via INTRAVENOUS
  Filled 2021-05-22 (×14): qty 200

## 2021-05-22 MED ORDER — DICLOFENAC SODIUM 1 % EX GEL
2.0000 g | Freq: Four times a day (QID) | CUTANEOUS | Status: DC
  Administered 2021-05-22 – 2021-05-29 (×25): 2 g via TOPICAL
  Filled 2021-05-22: qty 100

## 2021-05-22 MED ORDER — AMIODARONE HCL IN DEXTROSE 360-4.14 MG/200ML-% IV SOLN
60.0000 mg/h | INTRAVENOUS | Status: AC
Start: 2021-05-22 — End: 2021-05-22
  Administered 2021-05-22 (×2): 60 mg/h via INTRAVENOUS
  Filled 2021-05-22: qty 200

## 2021-05-22 MED ORDER — HEPARIN BOLUS VIA INFUSION
2000.0000 [IU] | Freq: Once | INTRAVENOUS | Status: AC
  Administered 2021-05-22: 2000 [IU] via INTRAVENOUS
  Filled 2021-05-22: qty 2000

## 2021-05-22 MED ORDER — FUROSEMIDE 10 MG/ML IJ SOLN
40.0000 mg | Freq: Once | INTRAMUSCULAR | Status: AC
  Administered 2021-05-22: 40 mg via INTRAVENOUS
  Filled 2021-05-22: qty 4

## 2021-05-22 MED ORDER — AMIODARONE LOAD VIA INFUSION
150.0000 mg | Freq: Once | INTRAVENOUS | Status: AC
Start: 2021-05-22 — End: 2021-05-22
  Administered 2021-05-22: 150 mg via INTRAVENOUS
  Filled 2021-05-22: qty 83.34

## 2021-05-22 MED ORDER — FUROSEMIDE 10 MG/ML IJ SOLN
40.0000 mg | Freq: Two times a day (BID) | INTRAMUSCULAR | Status: DC
  Administered 2021-05-22 – 2021-05-24 (×4): 40 mg via INTRAVENOUS
  Filled 2021-05-22 (×4): qty 4

## 2021-05-22 MED ORDER — PERFLUTREN LIPID MICROSPHERE
1.0000 mL | INTRAVENOUS | Status: AC | PRN
  Administered 2021-05-22: 5 mL via INTRAVENOUS

## 2021-05-22 NOTE — Assessment & Plan Note (Addendum)
3/13 UA >50 WBC ?-empiric ceftriaxone ?-urine culture with ESBL E coli and enterococcus ?-she was treated with 5 days of meropenem ?-blood cultures from 3/13 with no growth ?

## 2021-05-22 NOTE — Progress Notes (Signed)
*  PRELIMINARY RESULTS* ?Echocardiogram ?2D Echocardiogram has been performed. ? ?Krystal Ali ?05/22/2021, 9:29 AM ?

## 2021-05-22 NOTE — Assessment & Plan Note (Addendum)
Presented with dyspnea, tachypnea and hypoxia 86% on RA ?Currently oxygen on 2L ?Due to pulmonary edema/pleural effusions ?Lasix '40mg'$  IV bid started, but held due to rising creatinine ?Cardiology following ?She has been restarted on oral Lasix ?3/14 Echo--EF 20-25%, global HK, reduced RV systolic function, mild-mod TR ?Respiratory status has been stable ?

## 2021-05-22 NOTE — Assessment & Plan Note (Addendum)
Not on any agents in the outpatient setting ?A1c 5.9 ?

## 2021-05-22 NOTE — [place] FL2 (Signed)
?[place] MEDICAID FL2 LEVEL OF CARE SCREENING TOOL  ?  ? ?IDENTIFICATION  ?Patient Name: ?Krystal Ali Birthdate: 01-01-1946 Sex: female Admission Date (Current Location): ?05/21/2021  ?[place] and [place] Number: ? [place] and Address:  ?[place] [address], [place] ?     Provider Number: ?[id number]  ?Attending Physician Name and Address:  ?Orson Eva, MD ? Relative Name and Phone Number:  ?  ?   ?Current Level of Care: ?Hospital Recommended Level of Care: ?[facility] Prior Approval Number: ?  ? ?Date Approved/Denied: ?  PASRR Number: ?  ? ?Discharge Plan: ?SNF ?  ? ?Current Diagnoses: ?Patient Active Problem List  ? Diagnosis Date Noted  ? Acute respiratory failure with hypoxia ([place]) 05/22/2021  ? Atrial fibrillation with RVR ([place]) 05/21/2021  ? SOB (shortness of breath)   ? Urinary tract infection with hematuria   ? History of UTI 11/28/2020  ? Incomplete bladder emptying 11/28/2020  ? Hematuria 09/23/2020  ? Abnormal vaginal bleeding with endometrial thickness less than 16 mm present on transvaginal ultrasound in POST-Menopausal patient 09/23/2020  ? Post-menopausal bleeding-EndoMetrial Thickening 28 mm 09/23/2020  ? Anemia   ? Guaiac positive stools   ? GI bleeding 09/18/2020  ? Lumbar stenosis with neurogenic claudication 08/18/2020  ? Anxiety 07/18/2020  ? Depression, recurrent ([place]) 07/18/2020  ? Chronic low back pain with right-sided sciatica 05/01/2020  ? Overactive bladder 02/15/2020  ? Diabetes mellitus without complication ([place]) 32/54/9826  ? History of back surgery 02/15/2020  ? ? ?Orientation RESPIRATION BLADDER Height & Weight   ?  ?Self, Situation, Place, Time ? Normal Incontinent Weight: 188 lb 4.4 oz (85.4 kg) ?Height:  '5\' 5"'$  (165.1 cm)  ?BEHAVIORAL SYMPTOMS/MOOD NEUROLOGICAL BOWEL NUTRITION STATUS  ?    Incontinent Diet (see dc summary)  ?AMBULATORY STATUS COMMUNICATION OF NEEDS Skin   ?Extensive Assist Verbally Normal ?  ?  ?  ?     ?     ?     ? ? ?Personal Care Assistance Level of Assistance  ?Bathing, Feeding, Dressing Bathing Assistance: Maximum assistance ?Feeding assistance: Limited assistance ?Dressing Assistance: Maximum assistance ?   ? ?Functional Limitations Info  ?Sight, Hearing, Speech Sight Info: Adequate ?Hearing Info: Adequate ?Speech Info: Adequate  ? ? ?SPECIAL CARE FACTORS FREQUENCY  ?    ?  ?  ?  ?  ?  ?  ?   ? ? ?Contractures Contractures Info: Not present  ? ? ?Additional Factors Info  ?Code Status, Allergies Code Status Info: Full ?Allergies Info: Demerol, Morphine and related ?  ?  ?  ?   ? ?Current Medications (05/22/2021):  This is the current hospital active medication list ?Current Facility-Administered Medications  ?Medication Dose Route Frequency Provider Last Rate Last Admin  ? acetaminophen (TYLENOL) tablet 650 mg  650 mg Oral Q6H PRN Richarda Osmond, MD      ? Or  ? acetaminophen (TYLENOL) suppository 650 mg  650 mg Rectal Q6H PRN Richarda Osmond, MD      ? amiodarone (NEXTERONE) 1.8 mg/mL load via infusion 150 mg  150 mg Intravenous Once Freada Bergeron, MD      ? Followed by  ? amiodarone (NEXTERONE PREMIX) 360-4.14 MG/200ML-% (1.8 mg/mL) IV infusion  60 mg/hr Intravenous Continuous Freada Bergeron, MD      ? Followed by  ? amiodarone (NEXTERONE PREMIX) 360-4.14 MG/200ML-% (1.8 mg/mL) IV infusion  30 mg/hr Intravenous Continuous Pemberton,  Greer Ee, MD      ? bisacodyl (DULCOLAX) EC tablet 5 mg  5 mg Oral Daily PRN Richarda Osmond, MD      ? cefTRIAXone (ROCEPHIN) 2 g in sodium chloride 0.9 % 100 mL IVPB  2 g Intravenous Q24H Richarda Osmond, MD      ? Chlorhexidine Gluconate Cloth 2 % PADS 6 each  6 each Topical Q0600 Adefeso, Oladapo, DO   6 each at 05/21/21 2035  ? fluticasone (FLONASE) 50 MCG/ACT nasal spray 1 spray  1 spray Each Nare Daily Richarda Osmond, MD   1 spray at 05/22/21 0973  ? furosemide (LASIX) injection 40 mg  40 mg Intravenous BID Freada Bergeron, MD       ? gabapentin (NEURONTIN) capsule 600 mg  600 mg Oral TID Richarda Osmond, MD   600 mg at 05/22/21 5329  ? heparin ADULT infusion 100 units/mL (25000 units/260m)  1,000 Units/hr Intravenous Continuous LErenest Blank RPH 10 mL/hr at 05/22/21 0409 1,000 Units/hr at 05/22/21 0409  ? ipratropium-albuterol (DUONEB) 0.5-2.5 (3) MG/3ML nebulizer solution 3 mL  3 mL Nebulization Q6H PRN ARicharda Osmond MD      ? mupirocin ointment (BACTROBAN) 2 % 1 application.  1 application. Nasal BID Adefeso, Oladapo, DO   1 application. at 05/22/21 0808  ? perflutren lipid microspheres (DEFINITY) IV suspension  1-10 mL Intravenous PRN PFreada Bergeron MD   5 mL at 05/22/21 0925  ? polyethylene glycol (MIRALAX / GLYCOLAX) packet 17 g  17 g Oral Daily ARicharda Osmond MD   17 g at 05/22/21 09242 ? tamsulosin (FLOMAX) capsule 0.4 mg  0.4 mg Oral QPC supper ARicharda Osmond MD   0.4 mg at 05/21/21 1849  ? umeclidinium bromide (INCRUSE ELLIPTA) 62.5 MCG/ACT 1 puff  1 puff Inhalation Daily ARicharda Osmond MD      ? venlafaxine XR (EFFEXOR-XR) 24 hr capsule 150 mg  150 mg Oral Q breakfast ARicharda Osmond MD   150 mg at 05/22/21 06834 ? ? ? ?Discharge Medications: ?Please see discharge summary for a list of discharge medications. ? ?Relevant Imaging Results: ? ?Relevant Lab Results: ? ? ?Additional Information ?  ? ?KShade Flood LCSW ? ? ? ? ?

## 2021-05-22 NOTE — TOC Initial Note (Signed)
Transition of Care (TOC) - Initial/Assessment Note  ? ? ?Patient Details  ?Name: Krystal Ali ?MRN: [id number] ?Date of Birth: 1945/08/23 ? ?Transition of Care (TOC) CM/SW Contact:    ?Iona Beard, LCSWA ?Phone Number: ?05/22/2021, 10:49 AM ? ?Clinical Narrative:                 ?Pt is a long term resident at [facility]. CSW reached out to [place] in admissions at facility who states pt will be able to return once she is medically ready for discharge. Pt will need a new fl2 at that time. TOC to follow.  ? ?Expected Discharge Plan: [place] ?Barriers to Discharge: Continued Medical Work up ? ? ?Patient Goals and CMS Choice ?Patient states their goals for this hospitalization and ongoing recovery are:: Return to LTC facility ?  ?  ? ?Expected Discharge Plan and Services ?Expected Discharge Plan: [place] ?In-house Referral: Clinical Social Work ?  ?Post Acute Care Choice: Nursing Home ?Living arrangements for the past 2 months: [place] ?                ?  ?  ?  ?  ?  ?  ?  ?  ?  ?  ? ?Prior Living Arrangements/Services ?Living arrangements for the past 2 months: [place] ?Lives with:: Facility Resident ?Patient language and need for interpreter reviewed:: Yes ?Do you feel safe going back to the place where you live?: Yes      ?Need for Family Participation in Patient Care: Yes (Comment) ?Care giver support system in place?: Yes (comment) ?  ?Criminal Activity/Legal Involvement Pertinent to Current Situation/Hospitalization: No - Comment as needed ? ?Activities of Daily Living ?Home Assistive Devices/Equipment: None ?ADL Screening (condition at time of admission) ?Patient's cognitive ability adequate to safely complete daily activities?: Yes ?Is the patient deaf or have difficulty hearing?: No ?Does the patient have difficulty seeing, even when wearing glasses/contacts?: No ?Does the patient have difficulty concentrating, remembering, or making  decisions?: No ?Patient able to express need for assistance with ADLs?: Yes ?Does the patient have difficulty dressing or bathing?: Yes ?Independently performs ADLs?: No ?Communication: Independent ?Dressing (OT): Needs assistance ?Is this a change from baseline?: Pre-admission baseline ?Grooming: Needs assistance ?Is this a change from baseline?: Pre-admission baseline ?Feeding: Independent ?Bathing: Needs assistance ?Is this a change from baseline?: Pre-admission baseline ?Toileting: Needs assistance ?Is this a change from baseline?: Pre-admission baseline ?In/Out Bed: Dependent ?Is this a change from baseline?: Pre-admission baseline ?Walks in Home: Dependent ?Does the patient have difficulty walking or climbing stairs?: Yes ?Weakness of Legs: Both ?Weakness of Arms/Hands: None ? ?Permission Sought/Granted ?  ?  ?   ?   ?   ?   ? ?Emotional Assessment ?  ?  ?  ?  ?Alcohol / Substance Use: Not Applicable ?Psych Involvement: No (comment) ? ?Admission diagnosis:  SOB (shortness of breath) [R06.02] ?Atrial fibrillation with RVR ([place]) [I48.91] ?Atrial fibrillation, unspecified type ([place]) [I48.91] ?Patient Active Problem List  ? Diagnosis Date Noted  ? Acute respiratory failure with hypoxia ([place]) 05/22/2021  ? Atrial fibrillation with RVR ([place]) 05/21/2021  ? SOB (shortness of breath)   ? Urinary tract infection with hematuria   ? History of UTI 11/28/2020  ? Incomplete bladder emptying 11/28/2020  ? Hematuria 09/23/2020  ? Abnormal vaginal bleeding with endometrial thickness less than 16 mm present on transvaginal ultrasound in POST-Menopausal patient 09/23/2020  ? Post-menopausal bleeding-EndoMetrial  Thickening 28 mm 09/23/2020  ? Anemia   ? Guaiac positive stools   ? GI bleeding 09/18/2020  ? Lumbar stenosis with neurogenic claudication 08/18/2020  ? Anxiety 07/18/2020  ? Depression, recurrent ([place]) 07/18/2020  ? Chronic low back pain with right-sided sciatica 05/01/2020  ? Overactive bladder 02/15/2020  ? Diabetes  mellitus without complication ([place]) 78/29/5621  ? History of back surgery 02/15/2020  ? ?PCP:  Hal Morales, DO ?Pharmacy:  No Pharmacies Listed ? ? ? ?Social Determinants of Health (SDOH) Interventions ?  ? ?Readmission Risk Interventions ?No flowsheet data found. ? ? ?

## 2021-05-22 NOTE — Assessment & Plan Note (Addendum)
Paroxysmal atrial fibrillation ?Initially on diltiazem drip>>amiodarone drip with echo showed low EF ?HR remains in the 90s to low 100s in atrial fibrillation ?Cardiology following ?She was treated with IV amiodarone infusion, and subsequently transitioned to oral dosing ?She was also started on toprol XL ?We will hold off on TEE/cardioversion at this time since heart rate appears to be stabilizing ?She is anticoagulated with Eliquis ?TSH 2.946 ?

## 2021-05-22 NOTE — Assessment & Plan Note (Addendum)
Initially Lasix '40mg'$  IV bid started ?Cardiology following ?3/14 Echo--EF 20-25%, global HK, reduced RV systolic function, mild-mod TR ?Daily weights ?Accurate I/Os ?Diuretics transiently held since creatinine trending up ?-Creatinine has since improved ?Restarted on oral lasix, volume status improved ?She is also on toprol XL ?Blood pressure precludes addition of ARB ?

## 2021-05-22 NOTE — Assessment & Plan Note (Signed)
S/P L4, L5 laminectomies, L5 and S1 pedicle screw placement, L2-S1 posterolateral fusion, L4-5 TLIF on 08/18/2020 (Dr. Zada Finders) ?Pt has been bed bound/WC bound for many months now ?

## 2021-05-22 NOTE — Progress Notes (Signed)
? ?Progress Note ? ?Patient Name: Krystal Ali ?Date of Encounter: 05/22/2021 ? ?[facility] Cardiologist: None  ? ?Subjective  ? ?Feeling better and SOB improving. No chest pain or palpitations. Reports good UOP to lasix ? ?HR 90-110s ?Cr stable 0.81 ?BNP 269 ?Net negative 240; received lasix '40mg'$  IV ? ?Inpatient Medications  ?  ?Scheduled Meds: ? Chlorhexidine Gluconate Cloth  6 each Topical Q0600  ? fluticasone  1 spray Each Nare Daily  ? gabapentin  600 mg Oral TID  ? mupirocin ointment  1 application. Nasal BID  ? polyethylene glycol  17 g Oral Daily  ? tamsulosin  0.4 mg Oral QPC supper  ? umeclidinium bromide  1 puff Inhalation Daily  ? venlafaxine XR  150 mg Oral Q breakfast  ? ?Continuous Infusions: ? cefTRIAXone (ROCEPHIN)  IV    ? diltiazem (CARDIZEM) infusion 5 mg/hr (05/22/21 1027)  ? heparin 1,000 Units/hr (05/22/21 0409)  ? ?PRN Meds: ?acetaminophen **OR** acetaminophen, bisacodyl, ipratropium-albuterol  ? ?Vital Signs  ?  ?Vitals:  ? 05/22/21 0443 05/22/21 0530 05/22/21 0600 05/22/21 0615  ?BP:  108/89 100/72   ?Pulse:  (!) 126 (!) 46 98  ?Resp:  (!) 22 (!) 25 (!) 22  ?Temp: (!) 97.5 ?F (36.4 ?C)     ?TempSrc: Axillary     ?SpO2:  92% 100% 97%  ?Weight: 85.4 kg     ?Height:      ? ? ?Intake/Output Summary (Last 24 hours) at 05/22/2021 2536 ?Last data filed at 05/22/2021 0600 ?Gross per 24 hour  ?Intake 359.8 ml  ?Output 600 ml  ?Net -240.2 ml  ? ?Last 3 Weights 05/22/2021 05/21/2021 05/03/2021  ?Weight (lbs) 188 lb 4.4 oz 140 lb 140 lb  ?Weight (kg) 85.4 kg 63.504 kg 63.504 kg  ?   ? ?Telemetry  ?  ?Afib with HR 100-110s - Personally Reviewed ? ?ECG  ?  ?No new tracing - Personally Reviewed ? ?Physical Exam  ? ?GEN: Comfortable, laying in bed  ?Neck: JVD mildly elevated ?Cardiac: Tachycardic, irregular, soft systolic murmur ?Respiratory: Faint crackles as bases ?GI: Soft, nontender, non-distended  ?MS: No edema; No deformity. ?Neuro:  Nonfocal  ?Psych: Normal affect  ? ?Labs  ?  ?High Sensitivity  Troponin:   ?Recent Labs  ?Lab 05/21/21 ?1315 05/21/21 ?1501  ?TROPONINIHS 17 15  ?   ?Chemistry ?Recent Labs  ?Lab 05/21/21 ?1315 05/22/21 ?0133  ?NA 137 138  ?K 4.1 4.0  ?CL 102 103  ?CO2 25 26  ?GLUCOSE 138* 128*  ?BUN 26* 24*  ?CREATININE 0.74 0.81  ?CALCIUM 8.5* 8.4*  ?PROT 6.7  --   ?ALBUMIN 3.5  --   ?AST 14*  --   ?ALT 14  --   ?ALKPHOS 82  --   ?BILITOT 0.6  --   ?GFRNONAA >60 >60  ?ANIONGAP 10 9  ?  ?Lipids No results for input(s): CHOL, TRIG, HDL, LABVLDL, LDLCALC, CHOLHDL in the last 168 hours.  ?Hematology ?Recent Labs  ?Lab 05/21/21 ?1315 05/22/21 ?0133  ?WBC 5.2 4.7  ?RBC 3.90 3.81*  ?HGB 12.9 12.4  ?HCT 40.3 39.7  ?MCV 103.3* 104.2*  ?MCH 33.1 32.5  ?MCHC 32.0 31.2  ?RDW 12.7 12.7  ?PLT 232 192  ? ?Thyroid  ?Recent Labs  ?Lab 05/21/21 ?1315  ?TSH 2.946  ?  ?BNP ?Recent Labs  ?Lab 05/22/21 ?0133  ?BNP 269.0*  ?  ?DDimer No results for input(s): DDIMER in the last 168 hours.  ? ?Radiology  ?  ?  DG Chest Port 1 View ? ?Result Date: 05/21/2021 ?CLINICAL DATA:  Sepsis. EXAM: PORTABLE CHEST 1 VIEW COMPARISON:  09/08/2020 FINDINGS: Thoracolumbar spine fixation. Cervical spine fixation. Patient rotated left. Right paratracheal soft tissue fullness is present on the prior exam, suboptimally evaluated. Moderate cardiomegaly. Moderate hiatal hernia. Probable layering small bilateral pleural effusions. Mild interstitial edema. Breast tissue overlies the left greater than right lung bases. Concurrent bibasilar airspace disease. IMPRESSION: Limited exam secondary to patient body habitus, AP portable technique, and obliquity. Cardiomegaly with mild congestive heart failure and layering bilateral pleural effusions. Probable bibasilar airspace disease. Either atelectasis or concurrent infection. Hiatal hernia. Right paratracheal soft tissue fullness is present on the prior exam. This may be due to prominent great vessels and patient obliquity, given lack of right paratracheal mass or thyroid enlargement on the  06/08/2020 thoracic spine CT/myelogram. Consider follow-up nonemergent PA and lateral radiographs. Electronically Signed   By: Abigail Miyamoto M.D.   On: 05/21/2021 13:35   ? ?Cardiac Studies  ? ?TTE pending ? ?Patient Profile  ?   ?76 y.o. female hx of HTN, HLD, urinary retention and DMII who presented with worsening SOB and UTI found to be in new Afib with RVR for which Cardiology was consulted. ? ?Assessment & Plan  ?  ?#Afib with RVR: ?CHADs-vasc 5. Newly diagnosed. Patient presented with progressive SOB found to be in Afib with RVR with HR 130s in the setting of a UTI. Was initiated on dilt gtt with improvement of rates and heparin gtt for [facility].  ?-Continue dilt gtt for today; plan to transition to PO tomorrow ?-Continue heparin gtt; ultimately plan to transition to apixaban longterm (will transition pending TTE findings) ?-Check TTE ?-TSH normal ?  ?#Suspected HF: ?Patient mildly volume overloaded on exam with evidence of pulmonary edema on CXR concerning for acute HF exacerbation. Likely precipitated by Afib with RVR as above. TTE pending.  ?-Check TTE ?-Will give additional lasix '40mg'$  IV ?-Will add GDMT as tolerated pending TTE findings ?-Monitor I/Os and daily weights ?  ?#UTI: ?-Management per IM ?  ?   ? ?For questions or updates, please contact [place] ?Please consult [email or web address] for contact info under  ? ?  ?   ?Signed, ?Freada Bergeron, MD  ?05/22/2021, 6:33 AM    ?

## 2021-05-22 NOTE — Progress Notes (Signed)
?  ?       ?PROGRESS NOTE ? ?Krystal Ali VQM:[id number] DOB: Feb 14, 1946 DOA: 05/21/2021 ?PCP: Hal Morales, DO ? ?Brief History:  ?76 year old female with a history of diabetes mellitus type 2, hypertension, hyperlipidemia, anxiety, urinary retention presenting from [facility] with 2-day history of worsening shortness of breath.  The patient has resided at [facility] for about 9 months.  She is essentially wheelchair-bound.  However with activity she has noted increasing dyspnea on exertion.  She denies any fevers, chills, chest pain, syncope, dizziness, nausea, vomiting.  As result, the patient was brought to emergency department for further evaluation.  She was noted to have tachycardia with heart rate in the 140s. ?In the ED, the patient was afebrile and hemodynamically stable with oxygen saturation 98% on 2 L.  She was noted to have atrial fibrillation with RVR.  She was started on a diltiazem drip.  Chest x-ray showed pulmonary vascular congestion and bilateral pleural effusions.  Patient was started on IV heparin and IV diltiazem.  She also received furosemide 40 mg IV on 05/21/2021 and 05/22/2021.  Cardiology is following.  ? ? ? ?Assessment and Plan: ?* Atrial fibrillation with RVR ([place]) ?Initially on diltiazem drip>>amiodarone drip with echo showed low EF ?Continue IV heparin  ?Convert to oral NOAC prior to d/c ?TSH 2.946 ? ?Acute systolic CHF (congestive heart failure) ([place]) ?Lasix '40mg'$  IV bid started ?Cardiology following ?3/14 Echo--EF 20-25%, global HK, reduced RV systolic function, mild-mod TR ?Daily weights ?Accurate I/Os ? ?Acute respiratory failure with hypoxia ([place]) ?Presented with dyspnea, tachypnea and hypoxia 86% on RA ?Stable on 2L ?Due to pulmonary edema/pleural effusions ?Lasix '40mg'$  IV bid started ?Cardiology following ?3/14 Echo--EF 20-25%, global HK, reduced RV systolic function, mild-mod TR ? ?Pyuria ?3/13 UA >50 WBC ?-urine culture was not sent>>pt was started on ceftriaxone ?-ordered  urine culture 3/14, lower yield at this point ?-continue empiric ceftriaxone for now ? ?Lumbar stenosis with neurogenic claudication ?S/P L4, L5 laminectomies, L5 and S1 pedicle screw placement, L2-S1 posterolateral fusion, L4-5 TLIF on 08/18/2020 (Dr. Zada Finders) ?Pt has been bed bound/WC bound for many months now ? ?Controlled type 2 diabetes mellitus without complication, without long-term current use of insulin ([place]) ?Not on any agents in the outpatient setting ?Repeat A1C ?09/18/20 A1C--5.5 ? ? ? ? ? ? ? ? ?Status is: Inpatient ?Remains inpatient appropriate because: due to severity of illness requiring IV amiodarone and work up for CHF ? ? ? ?Family Communication:   no Family at bedside ? ?Consultants:  cardiology ? ?Code Status:  FULL  ? ?DVT Prophylaxis:  IV Heparin ? ? ?Procedures: ?As Listed in Progress Note Above ? ?Antibiotics: ?Ceftriaxone 3/13>> ? ? ? ? ? ? ? ?Subjective: ?Patient states breathing is a little better today but remains sob.  Denies cp, n/v/d, abd pain, f/c ? ?Objective: ?Vitals:  ? 05/22/21 9326 05/22/21 1030 05/22/21 1100 05/22/21 1111  ?BP:  106/71 112/74   ?Pulse: (!) 101 84 (!) 106   ?Resp: (!) 22 (!) 25 (!) 21   ?Temp:    (!) 97.5 ?F (36.4 ?C)  ?TempSrc:    Axillary  ?SpO2: 98% 97% 100%   ?Weight:      ?Height:      ? ? ?Intake/Output Summary (Last 24 hours) at 05/22/2021 1145 ?Last data filed at 05/22/2021 1000 ?Gross per 24 hour  ?Intake 387.84 ml  ?Output 1450 ml  ?Net -1062.16 ml  ? ?Weight change:  ?Exam: ? ?General:  Pt is alert,  follows commands appropriately, not in acute distress ?HEENT: No icterus, No thrush, No neck mass, [place]/AT ?Cardiovascular: IRRR, S1/S2, no rubs, no gallops ?Respiratory: bibasilar crackles.  no wheezing, no crackles, no rhonchi ?Abdomen: Soft/+BS, non tender, non distended, no guarding ?Extremities: trace LE edema, No lymphangitis, No petechiae, No rashes, no synovitis ? ? ?Data Reviewed: ?I have personally reviewed following labs and imaging studies ?Basic  Metabolic Panel: ?Recent Labs  ?Lab 05/21/21 ?1315 05/22/21 ?0133  ?NA 137 138  ?K 4.1 4.0  ?CL 102 103  ?CO2 25 26  ?GLUCOSE 138* 128*  ?BUN 26* 24*  ?CREATININE 0.74 0.81  ?CALCIUM 8.5* 8.4*  ? ?Liver Function Tests: ?Recent Labs  ?Lab 05/21/21 ?1315  ?AST 14*  ?ALT 14  ?ALKPHOS 82  ?BILITOT 0.6  ?PROT 6.7  ?ALBUMIN 3.5  ? ?No results for input(s): LIPASE, AMYLASE in the last 168 hours. ?No results for input(s): AMMONIA in the last 168 hours. ?Coagulation Profile: ?Recent Labs  ?Lab 05/21/21 ?1315  ?INR 1.3*  ? ?CBC: ?Recent Labs  ?Lab 05/21/21 ?1315 05/22/21 ?0133  ?WBC 5.2 4.7  ?NEUTROABS 3.6  --   ?HGB 12.9 12.4  ?HCT 40.3 39.7  ?MCV 103.3* 104.2*  ?PLT 232 192  ? ?Cardiac Enzymes: ?No results for input(s): CKTOTAL, CKMB, CKMBINDEX, TROPONINI in the last 168 hours. ?BNP: ?Invalid input(s): POCBNP ?CBG: ?No results for input(s): GLUCAP in the last 168 hours. ?HbA1C: ?No results for input(s): HGBA1C in the last 72 hours. ?Urine analysis: ?   ?Component Value Date/Time  ? COLORURINE YELLOW 05/21/2021 1303  ? APPEARANCEUR TURBID (A) 05/21/2021 1303  ? APPEARANCEUR Cloudy (A) 11/28/2020 1445  ? LABSPEC 1.018 05/21/2021 1303  ? PHURINE 5.0 05/21/2021 1303  ? GLUCOSEU NEGATIVE 05/21/2021 1303  ? HGBUR MODERATE (A) 05/21/2021 1303  ? [place] NEGATIVE 05/21/2021 1303  ? BILIRUBINUR Negative 11/28/2020 1445  ? [place] NEGATIVE 05/21/2021 1303  ? PROTEINUR 100 (A) 05/21/2021 1303  ? NITRITE POSITIVE (A) 05/21/2021 1303  ? LEUKOCYTESUR LARGE (A) 05/21/2021 1303  ? ?Sepsis Labs: ?'@LABRCNTIP'$ (procalcitonin:4,lacticidven:4) ?) ?Recent Results (from the past 240 hour(s))  ?Culture, blood (Routine x 2)     Status: None (Preliminary result)  ? Collection Time: 05/21/21  1:15 PM  ? Specimen: BLOOD  ?Result Value Ref Range Status  ? Specimen Description BLOOD LEFT ANTECUBITAL  Final  ? Special Requests   Final  ?  BOTTLES DRAWN AEROBIC AND ANAEROBIC Blood Culture adequate volume ?Performed at [facility], [address], [place], [place] [address] ?  ? Culture PENDING  Incomplete  ? Report Status PENDING  Incomplete  ?Culture, blood (Routine x 2)     Status: None (Preliminary result)  ? Collection Time: 05/21/21  1:17 PM  ? Specimen: BLOOD  ?Result Value Ref Range Status  ? Specimen Description BLOOD BLOOD RIGHT FOREARM  Final  ? Special Requests   Final  ?  BOTTLES DRAWN AEROBIC AND ANAEROBIC Blood Culture results may not be optimal due to an inadequate volume of blood received in culture bottles ?Performed at [facility], [address] [place], [place] [address] ?  ? Culture PENDING  Incomplete  ? Report Status PENDING  Incomplete  ?MRSA Next Gen by PCR, Nasal     Status: Abnormal  ? Collection Time: 05/21/21  6:42 PM  ? Specimen: Nasal Mucosa; Nasal Swab  ?Result Value Ref Range Status  ? MRSA by PCR Next Gen DETECTED (A) NOT DETECTED Final  ?  Comment: RESULT CALLED TO, READ BACK BY  AND VERIFIED WITH: ?MAYNARD,R ON 05/21/21 AT 1825 BY LOY,C ?(NOTE) ?The GeneXpert MRSA Assay (FDA approved for NASAL specimens only), ?is one component of a comprehensive MRSA colonization surveillance ?program. It is not intended to diagnose MRSA infection nor to guide ?or monitor treatment for MRSA infections. ?Test performance is not FDA approved in patients less than 2 years ?old. ?Performed at [facility], [address], [place], [place] [address] ?  ?  ? ?Scheduled Meds: ? Chlorhexidine Gluconate Cloth  6 each Topical Q0600  ? fluticasone  1 spray Each Nare Daily  ? furosemide  40 mg Intravenous BID  ? gabapentin  600 mg Oral TID  ? mupirocin ointment  1 application. Nasal BID  ? polyethylene glycol  17 g Oral Daily  ? tamsulosin  0.4 mg Oral QPC supper  ? umeclidinium bromide  1 puff Inhalation Daily  ? venlafaxine XR  150 mg Oral Q breakfast  ? ?Continuous Infusions: ? amiodarone 60 mg/hr (05/22/21 1129)  ? Followed by  ? amiodarone    ? cefTRIAXone (ROCEPHIN)  IV    ? heparin 1,000 Units/hr (05/22/21 0409)  ? ? ?Procedures/Studies: ?DG  Chest Port 1 View ? ?Result Date: 05/21/2021 ?CLINICAL DATA:  Sepsis. EXAM: PORTABLE CHEST 1 VIEW COMPARISON:  09/08/2020 FINDINGS: Thoracolumbar spine fixation. Cervical spine fixation. Patient rotated lef

## 2021-05-22 NOTE — Hospital Course (Addendum)
76 year old female with a history of diabetes mellitus type 2, hypertension, hyperlipidemia, anxiety, urinary retention presenting from  ?SNF with 2-day history of worsening shortness of breath.  The patient has resided at [facility] for about 9 months.  She is essentially wheelchair-bound.  However with activity she has noted increasing dyspnea on exertion.  She denies any fevers, chills, chest pain, syncope, dizziness, nausea, vomiting.  As result, the patient was brought to emergency department for further evaluation.  She was noted to have tachycardia with heart rate in the 140s. ?In the ED, the patient was afebrile and hemodynamically stable with oxygen saturation 98% on 2 L.  She was noted to have atrial fibrillation with RVR.  She was started on a diltiazem drip.  Chest x-ray showed pulmonary vascular congestion and bilateral pleural effusions.  Patient was started on IV heparin and IV diltiazem.  She also received furosemide 40 mg IV on 05/21/2021 and 05/22/2021.  Cardiology is following. ?

## 2021-05-22 NOTE — Progress Notes (Signed)
ANTICOAGULATION CONSULT NOTE  ? ?Pharmacy Consult for Heparin ?Indication: atrial fibrillation ? ?Allergies  ?Allergen Reactions  ? Demerol [Meperidine Hcl]   ?  Agitation  ? Morphine And Related   ?  No reaction listed on nursing home record  ? ? ?Patient Measurements: ?Height: '5\' 5"'$  (165.1 cm) ?Weight: 63.5 kg (140 lb) ?IBW/kg (Calculated) : 57 ?HEPARIN DW (KG): 63.5  ? ?Vital Signs: ?Temp: 97.1 ?F (36.2 ?C) (03/14 0000) ?Temp Source: Axillary (03/14 0000) ?BP: 99/44 (03/14 0206) ?Pulse Rate: 107 (03/14 0206) ? ?Labs: ?Recent Labs  ?  05/21/21 ?1315 05/21/21 ?1501 05/22/21 ?0133  ?HGB 12.9  --  12.4  ?HCT 40.3  --  39.7  ?PLT 232  --  192  ?LABPROT 15.8*  --   --   ?INR 1.3*  --   --   ?HEPARINUNFRC  --   --  0.28*  ?CREATININE 0.74  --  0.81  ?TROPONINIHS 17 15  --   ? ? ? ?Estimated Creatinine Clearance: 54 mL/min (by C-G formula based on SCr of 0.81 mg/dL). ? ? ?Medical History: ?Past Medical History:  ?Diagnosis Date  ? Anxiety   ? Diabetes mellitus without complication ([place])   ? GERD (gastroesophageal reflux disease)   ? Hyperlipidemia   ? Hypertension   ? Retention of urine, unspecified   ? 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 ? ?Medications:  ?See med rec ? ?Assessment: ?76 y.o. female with a hx of HTN, HLD and DMII who is being seen 05/21/2021 for the evaluation of Afib with RVR, new onset. Patient is not on any oral anticoagulants. Pharmacy asked to start heparin ? ?3/14 AM update:  ?Heparin level just below goal ? ?Goal of Therapy:  ?Heparin level 0.3-0.7 units/ml ?Monitor platelets by anticoagulation protocol: Yes ?  ?Plan:  ?Inc heparin to 1000 units/hr ?1000 heparin level ? ?Narda Bonds, PharmD, BCPS ?Clinical Pharmacist ?Phone: [phone] ? ? ? ?

## 2021-05-22 NOTE — Progress Notes (Signed)
ANTICOAGULATION CONSULT NOTE -  ? ?Pharmacy Consult for Heparin ?Indication: atrial fibrillation ? ?Allergies  ?Allergen Reactions  ? Demerol [Meperidine Hcl]   ?  Agitation  ? Morphine And Related   ?  No reaction listed on nursing home record  ? ? ?Patient Measurements: ?Height: '5\' 5"'$  (165.1 cm) ?Weight: 85.4 kg (188 lb 4.4 oz) ?IBW/kg (Calculated) : 57 ?HEPARIN DW (KG): 63.5  ? ?Vital Signs: ?Temp: 97.5 ?F (36.4 ?C) (03/14 1111) ?Temp Source: Axillary (03/14 1111) ?BP: 106/80 (03/14 1200) ?Pulse Rate: 106 (03/14 1200) ? ?Labs: ?Recent Labs  ?  05/21/21 ?1315 05/21/21 ?1501 05/22/21 ?0133 05/22/21 ?1000  ?HGB 12.9  --  12.4  --   ?HCT 40.3  --  39.7  --   ?PLT 232  --  192  --   ?LABPROT 15.8*  --   --   --   ?INR 1.3*  --   --   --   ?HEPARINUNFRC  --   --  0.28* 0.17*  ?CREATININE 0.74  --  0.81  --   ?TROPONINIHS 17 15  --   --   ? ? ? ?Estimated Creatinine Clearance: 64.8 mL/min (by C-G formula based on SCr of 0.81 mg/dL). ? ? ?Medical History: ?Past Medical History:  ?Diagnosis Date  ? Anxiety   ? Diabetes mellitus without complication ([place])   ? GERD (gastroesophageal reflux disease)   ? Hyperlipidemia   ? Hypertension   ? Retention of urine, unspecified   ? 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 ? ?Medications:  ?See med rec ? ?Assessment: ?76 y.o. female with a hx of HTN, HLD and DMII who is being seen 05/21/2021 for the evaluation of Afib with RVR, new onset. Patient is not on any oral anticoagulants. Pharmacy asked to start heparin ?HL 0.28 > 0.17, subtherapeutic. No issues with infusion per RN ? ?Goal of Therapy:  ?Heparin level 0.3-0.7 units/ml ?Monitor platelets by anticoagulation protocol: Yes ?  ?Plan:  ?Give 2000 units bolus x 1 ?Increase heparin infusion at 1200 units/hr ?Check anti-Xa level in ~8 hours and daily while on heparin ?Continue to monitor H&H and platelets ? ?Isac Sarna, BS Pharm D, BCPS ?Clinical Pharmacist ?Pager [phone] ?05/22/2021,12:52 PM ? ? ?

## 2021-05-22 NOTE — Progress Notes (Signed)
TTE with severely reduced LVEF 20-25%, mild-to-moderately reduced RV systolic function, and elevated filling pressures. ? ?Will stop dilt and change to amiodarone gtt. Increase lasix dosing to '40mg'$  BID. Will add GDMT as able. ? ?Gwyndolyn Kaufman, MD ?

## 2021-05-23 DIAGNOSIS — I5021 Acute systolic (congestive) heart failure: Secondary | ICD-10-CM | POA: Diagnosis not present

## 2021-05-23 DIAGNOSIS — E119 Type 2 diabetes mellitus without complications: Secondary | ICD-10-CM | POA: Diagnosis not present

## 2021-05-23 DIAGNOSIS — I4891 Unspecified atrial fibrillation: Secondary | ICD-10-CM | POA: Diagnosis not present

## 2021-05-23 DIAGNOSIS — J9601 Acute respiratory failure with hypoxia: Secondary | ICD-10-CM | POA: Diagnosis not present

## 2021-05-23 LAB — CBC
HCT: 35.5 % — ABNORMAL LOW (ref 36.0–46.0)
Hemoglobin: 11.3 g/dL — ABNORMAL LOW (ref 12.0–15.0)
MCH: 32.5 pg (ref 26.0–34.0)
MCHC: 31.8 g/dL (ref 30.0–36.0)
MCV: 102 fL — ABNORMAL HIGH (ref 80.0–100.0)
Platelets: 186 10*3/uL (ref 150–400)
RBC: 3.48 MIL/uL — ABNORMAL LOW (ref 3.87–5.11)
RDW: 12.7 % (ref 11.5–15.5)
WBC: 4.3 10*3/uL (ref 4.0–10.5)
nRBC: 0 % (ref 0.0–0.2)

## 2021-05-23 LAB — BASIC METABOLIC PANEL
Anion gap: 10 (ref 5–15)
BUN: 28 mg/dL — ABNORMAL HIGH (ref 8–23)
CO2: 28 mmol/L (ref 22–32)
Calcium: 8.4 mg/dL — ABNORMAL LOW (ref 8.9–10.3)
Chloride: 99 mmol/L (ref 98–111)
Creatinine, Ser: 0.87 mg/dL (ref 0.44–1.00)
GFR, Estimated: >60 mL/min (ref >60)
Glucose, Bld: 124 mg/dL — ABNORMAL HIGH (ref 70–99)
Potassium: 3.4 mmol/L — ABNORMAL LOW (ref 3.5–5.1)
Sodium: 137 mmol/L (ref 135–145)

## 2021-05-23 LAB — MAGNESIUM: Magnesium: 1.1 mg/dL — ABNORMAL LOW (ref 1.7–2.4)

## 2021-05-23 LAB — HEPARIN LEVEL (UNFRACTIONATED): Heparin Unfractionated: 0.51 IU/mL (ref 0.30–0.70)

## 2021-05-23 MED ORDER — AMIODARONE LOAD VIA INFUSION
150.0000 mg | Freq: Once | INTRAVENOUS | Status: AC
  Administered 2021-05-23: 150 mg via INTRAVENOUS
  Filled 2021-05-23: qty 83.34

## 2021-05-23 MED ORDER — POTASSIUM CHLORIDE CRYS ER 20 MEQ PO TBCR
40.0000 meq | EXTENDED_RELEASE_TABLET | ORAL | Status: AC
  Administered 2021-05-23 (×2): 40 meq via ORAL
  Filled 2021-05-23 (×2): qty 2

## 2021-05-23 MED ORDER — ONDANSETRON HCL 4 MG/2ML IJ SOLN
4.0000 mg | Freq: Four times a day (QID) | INTRAMUSCULAR | Status: DC | PRN
Start: 2021-05-23 — End: 2021-05-29
  Administered 2021-05-23 – 2021-05-27 (×2): 4 mg via INTRAVENOUS
  Filled 2021-05-23 (×2): qty 2

## 2021-05-23 MED ORDER — MAGNESIUM SULFATE 4 GM/100ML IV SOLN
4.0000 g | Freq: Once | INTRAVENOUS | Status: AC
  Administered 2021-05-23: 4 g via INTRAVENOUS
  Filled 2021-05-23: qty 100

## 2021-05-23 MED ORDER — SPIRONOLACTONE 12.5 MG HALF TABLET
12.5000 mg | ORAL_TABLET | Freq: Every day | ORAL | Status: DC
Start: 2021-05-23 — End: 2021-05-29
  Administered 2021-05-23 – 2021-05-29 (×7): 12.5 mg via ORAL
  Filled 2021-05-23 (×8): qty 1

## 2021-05-23 MED ORDER — MELATONIN 3 MG PO TABS
3.0000 mg | ORAL_TABLET | Freq: Every day | ORAL | Status: DC
  Administered 2021-05-23 – 2021-05-28 (×6): 3 mg via ORAL
  Filled 2021-05-23 (×6): qty 1

## 2021-05-23 NOTE — Progress Notes (Signed)
Patient placed on O2 [place] @ 1L d/t O2 SAT dropped in high 80s while asleep  ?

## 2021-05-23 NOTE — Progress Notes (Signed)
Patient on room air at baseline, removed O2 [place] @ 2L at this time, no respiratory distress noted at this time, O2 SAT 98% RA at this time  ?

## 2021-05-23 NOTE — Progress Notes (Signed)
? ?Progress Note ? ?Patient Name: Krystal Ali ?Date of Encounter: 05/23/2021 ? ?[facility] Cardiologist: New ? ?Subjective  ? ?SOB improving ? ?Inpatient Medications  ?  ?Scheduled Meds: ? Chlorhexidine Gluconate Cloth  6 each Topical Q0600  ? diclofenac Sodium  2 g Topical QID  ? fluticasone  1 spray Each Nare Daily  ? furosemide  40 mg Intravenous BID  ? gabapentin  600 mg Oral TID  ? mupirocin ointment  1 application. Nasal BID  ? polyethylene glycol  17 g Oral Daily  ? tamsulosin  0.4 mg Oral QPC supper  ? umeclidinium bromide  1 puff Inhalation Daily  ? venlafaxine XR  150 mg Oral Q breakfast  ? ?Continuous Infusions: ? amiodarone 30 mg/hr (05/22/21 1900)  ? cefTRIAXone (ROCEPHIN)  IV Stopped (05/22/21 1445)  ? heparin 1,200 Units/hr (05/22/21 1350)  ? ?PRN Meds: ?acetaminophen **OR** acetaminophen, bisacodyl, HYDROcodone-acetaminophen, ipratropium-albuterol  ? ?Vital Signs  ?  ?Vitals:  ? 05/22/21 2007 05/23/21 0029 05/23/21 0410 05/23/21 0740  ?BP:      ?Pulse:      ?Resp:      ?Temp: 98 ?F (36.7 ?C) 97.6 ?F (36.4 ?C) 97.6 ?F (36.4 ?C) (!) 97.1 ?F (36.2 ?C)  ?TempSrc: Axillary Oral Oral Axillary  ?SpO2:      ?Weight:   84.3 kg   ?Height:      ? ? ?Intake/Output Summary (Last 24 hours) at 05/23/2021 0857 ?Last data filed at 05/23/2021 0400 ?Gross per 24 hour  ?Intake 1419.7 ml  ?Output 1100 ml  ?Net 319.7 ml  ? ?Last 3 Weights 05/23/2021 05/22/2021 05/21/2021  ?Weight (lbs) 185 lb 13.6 oz 188 lb 4.4 oz 140 lb  ?Weight (kg) 84.3 kg 85.4 kg 63.504 kg  ?   ? ?Telemetry  ?  ?Afib 100s - Personally Reviewed ? ?ECG  ?  ?N/a - Personally Reviewed ? ?Physical Exam  ? ?GEN: No acute distress.   ?Neck: +JVD ?Cardiac: irreg ?Respiratory: Clear to auscultation bilaterally. ?GI: Soft, nontender, non-distended  ?MS: No edema; No deformity. ?Neuro:  Nonfocal  ?Psych: Normal affect  ? ?Labs  ?  ?High Sensitivity Troponin:   ?Recent Labs  ?Lab 05/21/21 ?1315 05/21/21 ?1501  ?TROPONINIHS 17 15  ?   ?Chemistry ?Recent Labs   ?Lab 05/21/21 ?1315 05/22/21 ?0133 05/23/21 ?0357  ?NA 137 138 137  ?K 4.1 4.0 3.4*  ?CL 102 103 99  ?CO2 '25 26 28  '$ ?GLUCOSE 138* 128* 124*  ?BUN 26* 24* 28*  ?CREATININE 0.74 0.81 0.87  ?CALCIUM 8.5* 8.4* 8.4*  ?MG  --   --  1.1*  ?PROT 6.7  --   --   ?ALBUMIN 3.5  --   --   ?AST 14*  --   --   ?ALT 14  --   --   ?ALKPHOS 82  --   --   ?BILITOT 0.6  --   --   ?GFRNONAA >60 >60 >60  ?ANIONGAP '10 9 10  '$ ?  ?Lipids No results for input(s): CHOL, TRIG, HDL, LABVLDL, LDLCALC, CHOLHDL in the last 168 hours.  ?Hematology ?Recent Labs  ?Lab 05/21/21 ?1315 05/22/21 ?0133 05/23/21 ?0357  ?WBC 5.2 4.7 4.3  ?RBC 3.90 3.81* 3.48*  ?HGB 12.9 12.4 11.3*  ?HCT 40.3 39.7 35.5*  ?MCV 103.3* 104.2* 102.0*  ?MCH 33.1 32.5 32.5  ?MCHC 32.0 31.2 31.8  ?RDW 12.7 12.7 12.7  ?PLT 232 192 186  ? ?Thyroid  ?Recent Labs  ?Lab 05/21/21 ?1315  ?TSH  2.946  ?  ?BNP ?Recent Labs  ?Lab 05/22/21 ?0133  ?BNP 269.0*  ?  ?DDimer No results for input(s): DDIMER in the last 168 hours.  ? ?Radiology  ?  ?DG Chest Port 1 View ? ?Result Date: 05/21/2021 ?CLINICAL DATA:  Sepsis. EXAM: PORTABLE CHEST 1 VIEW COMPARISON:  09/08/2020 FINDINGS: Thoracolumbar spine fixation. Cervical spine fixation. Patient rotated left. Right paratracheal soft tissue fullness is present on the prior exam, suboptimally evaluated. Moderate cardiomegaly. Moderate hiatal hernia. Probable layering small bilateral pleural effusions. Mild interstitial edema. Breast tissue overlies the left greater than right lung bases. Concurrent bibasilar airspace disease. IMPRESSION: Limited exam secondary to patient body habitus, AP portable technique, and obliquity. Cardiomegaly with mild congestive heart failure and layering bilateral pleural effusions. Probable bibasilar airspace disease. Either atelectasis or concurrent infection. Hiatal hernia. Right paratracheal soft tissue fullness is present on the prior exam. This may be due to prominent great vessels and patient obliquity, given lack of  right paratracheal mass or thyroid enlargement on the 06/08/2020 thoracic spine CT/myelogram. Consider follow-up nonemergent PA and lateral radiographs. Electronically Signed   By: Abigail Miyamoto M.D.   On: 05/21/2021 13:35  ? ?ECHOCARDIOGRAM COMPLETE ? ?Result Date: 05/22/2021 ?   ECHOCARDIOGRAM REPORT   Patient Name:   Krystal Ali Date of Exam: 05/22/2021 Medical Rec #:  [id number]        Height:       65.0 in Accession #:    [id number]       Weight:       188.3 lb Date of Birth:  1945/06/19        BSA:          1.928 m? Patient Age:    76 years         BP:           119/75 mmHg Patient Gender: F                HR:           99 bpm. Exam Location:  [facility] Procedure: 2D Echo, Color Doppler, Cardiac Doppler and Intracardiac            Opacification Agent Indications:    Atrial Fibrillation  History:        Patient has no prior history of Echocardiogram examinations.                 Arrythmias:Atrial Fibrillation, Signs/Symptoms:Shortness of                 Breath; Risk Factors:Diabetes.  Sonographer:    Wenda Low Referring Phys: [id number] [place]  1. Left ventricular ejection fraction, by estimation, is 20 to 25%. The left ventricle has severely decreased function. The left ventricle demonstrates global hypokinesis. Diastolic function indeterminant due to Afib. Elevated left atrial pressure.  2. Right ventricular systolic function mildly-to-moderately reduced. The right ventricular size is normal. There is moderately elevated pulmonary artery systolic pressure. The estimated right ventricular systolic pressure is 40.0 mmHg.  3. Left atrial size was moderately dilated.  4. Right atrial size was moderately dilated.  5. The mitral valve is abnormal. Mild mitral valve regurgitation.  6. Tricuspid valve regurgitation is mild to moderate.  7. The aortic valve is tricuspid. There is mild calcification of the aortic valve. There is mild thickening of the aortic valve. Aortic valve  regurgitation is not visualized. Aortic valve sclerosis/calcification is present, without any evidence of aortic stenosis.  8. The  inferior vena cava is dilated in size with <50% respiratory variability, suggesting right atrial pressure of 15 mmHg. Comparison(s): No prior Echocardiogram. FINDINGS  Left Ventricle: Left ventricular ejection fraction, by estimation, is 20 to 25%. The left ventricle has severely decreased function. The left ventricle demonstrates global hypokinesis. Definity contrast agent was given IV to delineate the left ventricular endocardial borders. The left ventricular internal cavity size was normal in size. There is no left ventricular hypertrophy. Diastolic function indeterminant due to Afib. Elevated left atrial pressure. Right Ventricle: The right ventricular size is normal. No increase in right ventricular wall thickness. Right ventricular systolic function mildly-to-moderately reduced. There is moderately elevated pulmonary artery systolic pressure. The tricuspid regurgitant velocity is 2.87 m/s, and with an assumed right atrial pressure of 15 mmHg, the estimated right ventricular systolic pressure is 00.4 mmHg. Left [facility]: Left atrial size was moderately dilated. Right [facility]: Right atrial size was moderately dilated. Pericardium: There is no evidence of pericardial effusion. Mitral Valve: The mitral valve is abnormal. There is mild thickening of the mitral valve leaflet(s). There is mild calcification of the mitral valve leaflet(s). Mild to moderate mitral annular calcification. Mild mitral valve regurgitation. MV peak gradient, 3.1 mmHg. The mean mitral valve gradient is 1.0 mmHg. Tricuspid Valve: The tricuspid valve is normal in structure. Tricuspid valve regurgitation is mild to moderate. Aortic Valve: The aortic valve is tricuspid. There is mild calcification of the aortic valve. There is mild thickening of the aortic valve. Aortic valve regurgitation is not visualized. Aortic  valve sclerosis/calcification is present, without any evidence of aortic stenosis. Aortic valve mean gradient measures 1.7 mmHg. Aortic valve peak gradient measures 3.2 mmHg. Aortic valve area, by VTI measures 2.57 cm?Marland Kitchen

## 2021-05-23 NOTE — Progress Notes (Signed)
ANTICOAGULATION CONSULT NOTE -  ? ?Pharmacy Consult for Heparin ?Indication: atrial fibrillation ? ?Allergies  ?Allergen Reactions  ? Demerol [Meperidine Hcl]   ?  Agitation  ? Morphine And Related   ?  No reaction listed on nursing home record  ? ? ?Patient Measurements: ?Height: '5\' 5"'$  (165.1 cm) ?Weight: 84.3 kg (185 lb 13.6 oz) ?IBW/kg (Calculated) : 57 ?HEPARIN DW (KG): 63.5  ? ?Vital Signs: ?Temp: 97.1 ?F (36.2 ?C) (03/15 0740) ?Temp Source: Axillary (03/15 0740) ?BP: 132/50 (03/14 2000) ?Pulse Rate: 114 (03/14 2000) ? ?Labs: ?Recent Labs  ?  05/21/21 ?1315 05/21/21 ?1315 05/21/21 ?1501 05/22/21 ?0133 05/22/21 ?1000 05/22/21 ?1951 05/23/21 ?0357  ?HGB 12.9  --   --  12.4  --   --  11.3*  ?HCT 40.3  --   --  39.7  --   --  35.5*  ?PLT 232  --   --  192  --   --  186  ?LABPROT 15.8*  --   --   --   --   --   --   ?INR 1.3*  --   --   --   --   --   --   ?HEPARINUNFRC  --    < >  --  0.28* 0.17* 0.38 0.51  ?CREATININE 0.74  --   --  0.81  --   --  0.87  ?TROPONINIHS 17  --  15  --   --   --   --   ? < > = values in this interval not displayed.  ? ? ? ?Estimated Creatinine Clearance: 59.9 mL/min (by C-G formula based on SCr of 0.87 mg/dL). ? ? ?Medical History: ?Past Medical History:  ?Diagnosis Date  ? Anxiety   ? Diabetes mellitus without complication ([place])   ? GERD (gastroesophageal reflux disease)   ? Hyperlipidemia   ? Hypertension   ? Retention of urine, unspecified   ? 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 ? ?Medications:  ?See med rec ? ?Assessment: ?76 y.o. female with a hx of HTN, HLD and DMII who is being seen 05/21/2021 for the evaluation of Afib with RVR, new onset. Patient is not on any oral anticoagulants. Pharmacy asked to start heparin ?HL 0.51 therapeutic. No issues with infusion per RN ? ?Goal of Therapy:  ?Heparin level 0.3-0.7 units/ml ?Monitor platelets by anticoagulation protocol: Yes ?  ?Plan:  ?Continue heparin infusion at 1200 units/hr ?Check anti-Xa level  daily while on heparin ?Continue to monitor H&H and platelets ? ?Donna Christen Cricket Goodlin, PharmD, MBA ?Clinical Pharmacist ? ?05/23/2021,7:45 AM ? ? ?

## 2021-05-23 NOTE — Progress Notes (Signed)
?Progress Note ? ? ?Patient: Krystal Ali TMA:[id number] DOB: September 07, 1945 DOA: 05/21/2021     2 ?DOS: the patient was seen and examined on 05/23/2021 ?  ?Brief hospital course: ?76 year old female with a history of diabetes mellitus type 2, hypertension, hyperlipidemia, anxiety, urinary retention presenting from [place] with 2-day history of worsening shortness of breath.  The patient has resided at [facility] for about 9 months.  She is essentially wheelchair-bound.  However with activity she has noted increasing dyspnea on exertion.  She denies any fevers, chills, chest pain, syncope, dizziness, nausea, vomiting.  As result, the patient was brought to emergency department for further evaluation.  She was noted to have tachycardia with heart rate in the 140s. ?In the ED, the patient was afebrile and hemodynamically stable with oxygen saturation 98% on 2 L.  She was noted to have atrial fibrillation with RVR.  She was started on a diltiazem drip.  Chest x-ray showed pulmonary vascular congestion and bilateral pleural effusions.  Patient was started on IV heparin and IV diltiazem.  She also received furosemide 40 mg IV on 05/21/2021 and 05/22/2021.  Cardiology is following. ? ?Assessment and Plan: ?* Atrial fibrillation with RVR ([place]) ?Initially on diltiazem drip>>amiodarone drip with echo showed low EF ?HR remains uncontrolled today and BP is labile. Avoiding starting BB today ?Cardiology following, re-bolus amiodarone today ?Continue IV heparin  ?Convert to oral NOAC prior to d/c ?TSH 2.946 ? ?Acute systolic CHF (congestive heart failure) ([place]) ?Lasix '40mg'$  IV bid started ?Cardiology following ?3/14 Echo--EF 20-25%, global HK, reduced RV systolic function, mild-mod TR ?Daily weights ?Accurate I/Os ?Plans to continue IV diuretics today ? ?Acute respiratory failure with hypoxia ([place]) ?Presented with dyspnea, tachypnea and hypoxia 86% on RA ?Currently oxygen weaned down to 1L ?Due to pulmonary edema/pleural effusions ?Lasix  '40mg'$  IV bid started ?Cardiology following ?3/14 Echo--EF 20-25%, global HK, reduced RV systolic function, mild-mod TR ?Continue to wean off oxygen as tolerated ? ?Pyuria ?3/13 UA >50 WBC ?-urine culture was not sent>>pt was started on ceftriaxone ?-ordered urine culture 3/14, lower yield at this point ?-continue empiric ceftriaxone for now ?-can likely discontinue tomorrow if urine culture with no growth ?-blood cultures from 3/13 still pending ? ?Lumbar stenosis with neurogenic claudication ?S/P L4, L5 laminectomies, L5 and S1 pedicle screw placement, L2-S1 posterolateral fusion, L4-5 TLIF on 08/18/2020 (Dr. Zada Finders) ?Pt has been bed bound/WC bound for many months now ? ?Controlled type 2 diabetes mellitus without complication, without long-term current use of insulin ([place]) ?Not on any agents in the outpatient setting ?Repeat A1C ?09/18/20 A1C--5.5 ? ? ? ? ?  ? ?Subjective: feels short of breath today. No chest pain. She does have palpitations ? ?Physical Exam: ?Vitals:  ? 05/23/21 0830 05/23/21 0900 05/23/21 1114 05/23/21 1606  ?BP: 105/82     ?Pulse: (!) 103     ?Resp:      ?Temp:   (!) 96.8 ?F (36 ?C) (!) 97 ?F (36.1 ?C)  ?TempSrc:   Axillary Axillary  ?SpO2:  94%    ?Weight:      ?Height:      ? ?General exam: Alert, awake, oriented x 3 ?Respiratory system: Clear to auscultation. Respiratory effort normal. ?Cardiovascular system:irregular. No murmurs, rubs, gallops. ?Gastrointestinal system: Abdomen is nondistended, soft and nontender. No organomegaly or masses felt. Normal bowel sounds heard. ?Central nervous system: Alert and oriented. No focal neurological deficits. ?Extremities: No C/C/E, +pedal pulses ?Skin: No rashes, lesions or ulcers ?Psychiatry: Judgement and insight appear normal. Mood &  affect appropriate.  ? ?Data Reviewed: ? ?Reviewed chemistry and cbc ? ?Family Communication: no family present ? ?Disposition: ?Status is: Inpatient ?Remains inpatient appropriate because: continued IV meds for  diuresis and management of rapid atrial fib ? Planned Discharge Destination: Skilled nursing facility ? ? ? ?Time spent: 35 minutes ? ?Author: ?Kathie Dike, MD ?05/23/2021 6:21 PM ? ?For on call review [email or web address].  ?

## 2021-05-24 ENCOUNTER — Other Ambulatory Visit: Payer: Self-pay

## 2021-05-24 ENCOUNTER — Telehealth: Payer: Self-pay

## 2021-05-24 DIAGNOSIS — J9601 Acute respiratory failure with hypoxia: Secondary | ICD-10-CM | POA: Diagnosis not present

## 2021-05-24 DIAGNOSIS — I4891 Unspecified atrial fibrillation: Secondary | ICD-10-CM | POA: Diagnosis not present

## 2021-05-24 DIAGNOSIS — I5021 Acute systolic (congestive) heart failure: Secondary | ICD-10-CM | POA: Diagnosis not present

## 2021-05-24 DIAGNOSIS — E119 Type 2 diabetes mellitus without complications: Secondary | ICD-10-CM | POA: Diagnosis not present

## 2021-05-24 LAB — HEPARIN LEVEL (UNFRACTIONATED): Heparin Unfractionated: 0.37 IU/mL (ref 0.30–0.70)

## 2021-05-24 LAB — CBC
HCT: 37.8 % (ref 36.0–46.0)
Hemoglobin: 12 g/dL (ref 12.0–15.0)
MCH: 32.9 pg (ref 26.0–34.0)
MCHC: 31.7 g/dL (ref 30.0–36.0)
MCV: 103.6 fL — ABNORMAL HIGH (ref 80.0–100.0)
Platelets: 221 10*3/uL (ref 150–400)
RBC: 3.65 MIL/uL — ABNORMAL LOW (ref 3.87–5.11)
RDW: 12.8 % (ref 11.5–15.5)
WBC: 5.2 10*3/uL (ref 4.0–10.5)
nRBC: 0 % (ref 0.0–0.2)

## 2021-05-24 LAB — BASIC METABOLIC PANEL
Anion gap: 9 (ref 5–15)
BUN: 32 mg/dL — ABNORMAL HIGH (ref 8–23)
CO2: 28 mmol/L (ref 22–32)
Calcium: 8.7 mg/dL — ABNORMAL LOW (ref 8.9–10.3)
Chloride: 98 mmol/L (ref 98–111)
Creatinine, Ser: 1.34 mg/dL — ABNORMAL HIGH (ref 0.44–1.00)
GFR, Estimated: 41 mL/min — ABNORMAL LOW (ref >60)
Glucose, Bld: 127 mg/dL — ABNORMAL HIGH (ref 70–99)
Potassium: 4.7 mmol/L (ref 3.5–5.1)
Sodium: 135 mmol/L (ref 135–145)

## 2021-05-24 LAB — MAGNESIUM: Magnesium: 1.9 mg/dL (ref 1.7–2.4)

## 2021-05-24 LAB — HEMOGLOBIN A1C
Hgb A1c MFr Bld: 5.9 % — ABNORMAL HIGH (ref 4.8–5.6)
Mean Plasma Glucose: 123 mg/dL

## 2021-05-24 LAB — PHOSPHORUS: Phosphorus: 4.7 mg/dL — ABNORMAL HIGH (ref 2.5–4.6)

## 2021-05-24 NOTE — Progress Notes (Signed)
? ?Progress Note ? ?Patient Name: Krystal Ali ?Date of Encounter: 05/24/2021 ? ?[facility] Cardiologist: New ? ?Subjective  ? ?Some fatigue this AM ? ?Inpatient Medications  ?  ?Scheduled Meds: ? Chlorhexidine Gluconate Cloth  6 each Topical Q0600  ? diclofenac Sodium  2 g Topical QID  ? fluticasone  1 spray Each Nare Daily  ? furosemide  40 mg Intravenous BID  ? gabapentin  600 mg Oral TID  ? melatonin  3 mg Oral QHS  ? mupirocin ointment  1 application. Nasal BID  ? polyethylene glycol  17 g Oral Daily  ? spironolactone  12.5 mg Oral Daily  ? tamsulosin  0.4 mg Oral QPC supper  ? umeclidinium bromide  1 puff Inhalation Daily  ? venlafaxine XR  150 mg Oral Q breakfast  ? ?Continuous Infusions: ? amiodarone 30 mg/hr (05/23/21 4097)  ? cefTRIAXone (ROCEPHIN)  IV 2 g (05/23/21 1453)  ? heparin 1,200 Units/hr (05/24/21 0717)  ? ?PRN Meds: ?acetaminophen **OR** acetaminophen, bisacodyl, HYDROcodone-acetaminophen, ipratropium-albuterol, ondansetron (ZOFRAN) IV  ? ?Vital Signs  ?  ?Vitals:  ? 05/24/21 0000 05/24/21 0400 05/24/21 0700 05/24/21 0726  ?BP:  109/71 116/62   ?Pulse:  (!) 108 (!) 103   ?Resp:  20 (!) 22   ?Temp: 98.1 ?F (36.7 ?C) 98 ?F (36.7 ?C)  (!) 97.3 ?F (36.3 ?C)  ?TempSrc:    Axillary  ?SpO2:  97% 96%   ?Weight:      ?Height:      ? ? ?Intake/Output Summary (Last 24 hours) at 05/24/2021 0806 ?Last data filed at 05/23/2021 2100 ?Gross per 24 hour  ?Intake 1071.05 ml  ?Output 950 ml  ?Net 121.05 ml  ? ?Last 3 Weights 05/23/2021 05/22/2021 05/21/2021  ?Weight (lbs) 185 lb 13.6 oz 188 lb 4.4 oz 140 lb  ?Weight (kg) 84.3 kg 85.4 kg 63.504 kg  ?   ? ?Telemetry  ?  ?Afib rates low 100s - Personally Reviewed ? ?ECG  ?  ?N/a - Personally Reviewed ? ?Physical Exam  ? ?GEN: No acute distress.   ?Neck: No JVD ?Cardiac: irreg ?Respiratory: Clear to auscultation bilaterally. ?GI: Soft, nontender, non-distended  ?MS: No edema; No deformity. ?Neuro:  Nonfocal  ?Psych: Normal affect  ? ?Labs  ?  ?High Sensitivity  Troponin:   ?Recent Labs  ?Lab 05/21/21 ?1315 05/21/21 ?1501  ?TROPONINIHS 17 15  ?   ?Chemistry ?Recent Labs  ?Lab 05/21/21 ?1315 05/22/21 ?0133 05/23/21 ?0357 05/24/21 ?0434  ?NA 137 138 137 135  ?K 4.1 4.0 3.4* 4.7  ?CL 102 103 99 98  ?CO2 '25 26 28 28  '$ ?GLUCOSE 138* 128* 124* 127*  ?BUN 26* 24* 28* 32*  ?CREATININE 0.74 0.81 0.87 1.34*  ?CALCIUM 8.5* 8.4* 8.4* 8.7*  ?MG  --   --  1.1* 1.9  ?PROT 6.7  --   --   --   ?ALBUMIN 3.5  --   --   --   ?AST 14*  --   --   --   ?ALT 14  --   --   --   ?ALKPHOS 82  --   --   --   ?BILITOT 0.6  --   --   --   ?GFRNONAA >60 >60 >60 41*  ?ANIONGAP '10 9 10 9  '$ ?  ?Lipids No results for input(s): CHOL, TRIG, HDL, LABVLDL, LDLCALC, CHOLHDL in the last 168 hours.  ?Hematology ?Recent Labs  ?Lab 05/22/21 ?0133 05/23/21 ?3532 05/24/21 ?0434  ?WBC  4.7 4.3 5.2  ?RBC 3.81* 3.48* 3.65*  ?HGB 12.4 11.3* 12.0  ?HCT 39.7 35.5* 37.8  ?MCV 104.2* 102.0* 103.6*  ?MCH 32.5 32.5 32.9  ?MCHC 31.2 31.8 31.7  ?RDW 12.7 12.7 12.8  ?PLT 192 186 221  ? ?Thyroid  ?Recent Labs  ?Lab 05/21/21 ?1315  ?TSH 2.946  ?  ?BNP ?Recent Labs  ?Lab 05/22/21 ?0133  ?BNP 269.0*  ?  ?DDimer No results for input(s): DDIMER in the last 168 hours.  ? ?Radiology  ?  ?ECHOCARDIOGRAM COMPLETE ? ?Result Date: 05/22/2021 ?   ECHOCARDIOGRAM REPORT   Patient Name:   Krystal Ali Date of Exam: 05/22/2021 Medical Rec #:  [id number]        Height:       65.0 in Accession #:    [id number]       Weight:       188.3 lb Date of Birth:  08/28/1945        BSA:          1.928 m? Patient Age:    76 years         BP:           119/75 mmHg Patient Gender: F                HR:           99 bpm. Exam Location:  [facility] Procedure: 2D Echo, Color Doppler, Cardiac Doppler and Intracardiac            Opacification Agent Indications:    Atrial Fibrillation  History:        Patient has no prior history of Echocardiogram examinations.                 Arrythmias:Atrial Fibrillation, Signs/Symptoms:Shortness of                 Breath; Risk  Factors:Diabetes.  Sonographer:    Wenda Low Referring Phys: [id number] [place]  1. Left ventricular ejection fraction, by estimation, is 20 to 25%. The left ventricle has severely decreased function. The left ventricle demonstrates global hypokinesis. Diastolic function indeterminant due to Afib. Elevated left atrial pressure.  2. Right ventricular systolic function mildly-to-moderately reduced. The right ventricular size is normal. There is moderately elevated pulmonary artery systolic pressure. The estimated right ventricular systolic pressure is 37.3 mmHg.  3. Left atrial size was moderately dilated.  4. Right atrial size was moderately dilated.  5. The mitral valve is abnormal. Mild mitral valve regurgitation.  6. Tricuspid valve regurgitation is mild to moderate.  7. The aortic valve is tricuspid. There is mild calcification of the aortic valve. There is mild thickening of the aortic valve. Aortic valve regurgitation is not visualized. Aortic valve sclerosis/calcification is present, without any evidence of aortic stenosis.  8. The inferior vena cava is dilated in size with <50% respiratory variability, suggesting right atrial pressure of 15 mmHg. Comparison(s): No prior Echocardiogram. FINDINGS  Left Ventricle: Left ventricular ejection fraction, by estimation, is 20 to 25%. The left ventricle has severely decreased function. The left ventricle demonstrates global hypokinesis. Definity contrast agent was given IV to delineate the left ventricular endocardial borders. The left ventricular internal cavity size was normal in size. There is no left ventricular hypertrophy. Diastolic function indeterminant due to Afib. Elevated left atrial pressure. Right Ventricle: The right ventricular size is normal. No increase in right ventricular wall thickness. Right ventricular systolic function mildly-to-moderately reduced. There is moderately elevated  pulmonary artery systolic pressure. The  tricuspid regurgitant velocity is 2.87 m/s, and with an assumed right atrial pressure of 15 mmHg, the estimated right ventricular systolic pressure is 22.0 mmHg. Left Atrium: Left atrial size was moderately dilated. Right Atrium: Right atrial size was moderately dilated. Pericardium: There is no evidence of pericardial effusion. Mitral Valve: The mitral valve is abnormal. There is mild thickening of the mitral valve leaflet(s). There is mild calcification of the mitral valve leaflet(s). Mild to moderate mitral annular calcification. Mild mitral valve regurgitation. MV peak gradient, 3.1 mmHg. The mean mitral valve gradient is 1.0 mmHg. Tricuspid Valve: The tricuspid valve is normal in structure. Tricuspid valve regurgitation is mild to moderate. Aortic Valve: The aortic valve is tricuspid. There is mild calcification of the aortic valve. There is mild thickening of the aortic valve. Aortic valve regurgitation is not visualized. Aortic valve sclerosis/calcification is present, without any evidence of aortic stenosis. Aortic valve mean gradient measures 1.7 mmHg. Aortic valve peak gradient measures 3.2 mmHg. Aortic valve area, by VTI measures 2.57 cm?. Pulmonic Valve: The pulmonic valve was normal in structure. Pulmonic valve regurgitation is mild. Aorta: The aortic root and ascending aorta are structurally normal, with no evidence of dilitation. Venous: The inferior vena cava is dilated in size with less than 50% respiratory variability, suggesting right atrial pressure of 15 mmHg. IAS/Shunts: The atrial septum is grossly normal.  LEFT VENTRICLE PLAX 2D LVIDd:         5.20 cm   Diastology LVIDs:         4.80 cm   LV e' medial:    3.92 cm/s LV PW:         1.10 cm   LV E/e' medial:  22.5 LV IVS:        1.10 cm   LV e' lateral:   5.43 cm/s LVOT diam:     1.90 cm   LV E/e' lateral: 16.2 LV SV:         45 LV SV Index:   23 LVOT Area:     2.84 cm?  RIGHT VENTRICLE RV Basal diam:  3.20 cm RV Mid diam:    3.20 cm RV S prime:      8.27 cm/s TAPSE (M-mode): 1.5 cm LEFT ATRIUM             Index        RIGHT ATRIUM           Index LA diam:        4.10 cm 2.13 cm/m?   RA Area:     21.30 cm? LA Vol (A2C):   82.3 ml 42.69 ml/m?  RA Volume:   7

## 2021-05-24 NOTE — Telephone Encounter (Signed)
Received call from nurse at [facility] to ask we have seen patient recent urine result faxed over.  ? ?Confirmed we have received the urine result however, patient is currently admitted to [facility] ICU.  ? ?[facility] has recultured urine as well and patient would receive treatment while admitted  ?

## 2021-05-24 NOTE — Progress Notes (Signed)
ANTICOAGULATION CONSULT NOTE -  ? ?Pharmacy Consult for Heparin ?Indication: atrial fibrillation ? ?Allergies  ?Allergen Reactions  ? Demerol [Meperidine Hcl]   ?  Agitation  ? Morphine And Related   ?  No reaction listed on nursing home record  ? ? ?Patient Measurements: ?Height: '5\' 5"'$  (165.1 cm) ?Weight: 84.3 kg (185 lb 13.6 oz) ?IBW/kg (Calculated) : 57 ?HEPARIN DW (KG): 63.5  ? ?Vital Signs: ?Temp: 97.3 ?F (36.3 ?C) (03/16 0726) ?Temp Source: Axillary (03/16 0726) ?BP: 103/77 (03/16 0830) ?Pulse Rate: 97 (03/16 0830) ? ?Labs: ?Recent Labs  ?  05/21/21 ?1315 05/21/21 ?1501 05/22/21 ?0133 05/22/21 ?1000 05/22/21 ?1951 05/23/21 ?0357 05/24/21 ?0434  ?HGB 12.9  --  12.4  --   --  11.3* 12.0  ?HCT 40.3  --  39.7  --   --  35.5* 37.8  ?PLT 232  --  192  --   --  186 221  ?LABPROT 15.8*  --   --   --   --   --   --   ?INR 1.3*  --   --   --   --   --   --   ?HEPARINUNFRC  --   --  0.28*   < > 0.38 0.51 0.37  ?CREATININE 0.74  --  0.81  --   --  0.87 1.34*  ?TROPONINIHS 17 15  --   --   --   --   --   ? < > = values in this interval not displayed.  ? ? ? ?Estimated Creatinine Clearance: 38.9 mL/min (A) (by C-G formula based on SCr of 1.34 mg/dL (H)). ? ? ?Medical History: ?Past Medical History:  ?Diagnosis Date  ? Anxiety   ? Diabetes mellitus without complication ([place])   ? GERD (gastroesophageal reflux disease)   ? Hyperlipidemia   ? Hypertension   ? Retention of urine, unspecified   ? 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 ? ?Medications:  ?See med rec ? ?Assessment: ?76 y.o. female with a hx of HTN, HLD and DMII who is being seen 05/21/2021 for the evaluation of Afib with RVR, new onset. Patient is not on any oral anticoagulants. Pharmacy asked to start heparin ?HL 0.37 therapeutic. No issues with infusion per RN ? ?Goal of Therapy:  ?Heparin level 0.3-0.7 units/ml ?Monitor platelets by anticoagulation protocol: Yes ?  ?Plan:  ?Continue heparin infusion at 1200 units/hr ?Check anti-Xa  level daily while on heparin ?Continue to monitor H&H and platelets ? ?Isac Sarna, BS Pharm D, BCPS ?Clinical Pharmacist ?Pager [phone] ?05/24/2021,11:10 AM ? ? ?

## 2021-05-24 NOTE — Progress Notes (Signed)
?Progress Note ? ? ?Patient: Krystal Ali ESP:[id number] DOB: Jun 20, 1945 DOA: 05/21/2021     3 ?DOS: the patient was seen and examined on 05/24/2021 ?  ?Brief hospital course: ?76 year old female with a history of diabetes mellitus type 2, hypertension, hyperlipidemia, anxiety, urinary retention presenting from [place] with 2-day history of worsening shortness of breath.  The patient has resided at [facility] for about 9 months.  She is essentially wheelchair-bound.  However with activity she has noted increasing dyspnea on exertion.  She denies any fevers, chills, chest pain, syncope, dizziness, nausea, vomiting.  As result, the patient was brought to emergency department for further evaluation.  She was noted to have tachycardia with heart rate in the 140s. ?In the ED, the patient was afebrile and hemodynamically stable with oxygen saturation 98% on 2 L.  She was noted to have atrial fibrillation with RVR.  She was started on a diltiazem drip.  Chest x-ray showed pulmonary vascular congestion and bilateral pleural effusions.  Patient was started on IV heparin and IV diltiazem.  She also received furosemide 40 mg IV on 05/21/2021 and 05/22/2021.  Cardiology is following. ? ?Assessment and Plan: ?* Atrial fibrillation with RVR ([place]) ?Initially on diltiazem drip>>amiodarone drip with echo showed low EF ?HR remains uncontrolled today and BP is labile. Avoiding starting BB today ?Cardiology following ?Continue on IV amiodarone ?May need to consider TEE/cardioversion if does not spontaneously convert ?Continue IV heparin  ?Convert to oral NOAC prior to d/c ?TSH 2.946 ? ?Acute systolic CHF (congestive heart failure) ([place]) ?Lasix '40mg'$  IV bid started ?Cardiology following ?3/14 Echo--EF 20-25%, global HK, reduced RV systolic function, mild-mod TR ?Daily weights ?Accurate I/Os ?Since creatinine trending up, further diuretics have been held ? ?Acute respiratory failure with hypoxia ([place]) ?Presented with dyspnea, tachypnea and  hypoxia 86% on RA ?Currently oxygen weaned down to 1L ?Due to pulmonary edema/pleural effusions ?Lasix '40mg'$  IV bid started ?Cardiology following ?3/14 Echo--EF 20-25%, global HK, reduced RV systolic function, mild-mod TR ?Continue to wean off oxygen as tolerated ? ?Pyuria ?3/13 UA >50 WBC ?-urine culture was not sent>>pt was started on ceftriaxone ?-ordered urine culture 3/14, lower yield at this point ?-continue empiric ceftriaxone for now ?-blood cultures from 3/13 still pending ? ?Lumbar stenosis with neurogenic claudication ?S/P L4, L5 laminectomies, L5 and S1 pedicle screw placement, L2-S1 posterolateral fusion, L4-5 TLIF on 08/18/2020 (Dr. Zada Finders) ?Pt has been bed bound/WC bound for many months now ? ?Controlled type 2 diabetes mellitus without complication, without long-term current use of insulin ([place]) ?Not on any agents in the outpatient setting ?Repeat A1C ?09/18/20 A1C--5.5 ? ? ? ? ?  ? ?Subjective: She feels sleepy today.  Denies any chest pain. ? ?Physical Exam: ?Vitals:  ? 05/24/21 1615 05/24/21 1700 05/24/21 1800 05/24/21 2000  ?BP: (!) 141/115 (!) 151/56 (!) 126/58   ?Pulse: 99 95 (!) 102   ?Resp: (!) 23 (!) 25 (!) 21   ?Temp:    97.7 ?F (36.5 ?C)  ?TempSrc:    Oral  ?SpO2: 98% 97% 94%   ?Weight:      ?Height:      ? ?General exam: Alert, awake, oriented x 3 ?Respiratory system: Clear to auscultation. Respiratory effort normal. ?Cardiovascular system: Irregular. No murmurs, rubs, gallops. ?Gastrointestinal system: Abdomen is nondistended, soft and nontender. No organomegaly or masses felt. Normal bowel sounds heard. ?Central nervous system: Alert and oriented. No focal neurological deficits. ?Extremities: No C/C/E, +pedal pulses ?Skin: No rashes, lesions or ulcers ?Psychiatry: Judgement and insight  appear normal. Mood & affect appropriate.  ? ?Data Reviewed: ? ?There are no new results to review at this time. ? ?Family Communication: No family present ? ?Disposition: ?Status is: Inpatient ?Remains  inpatient appropriate because: Remains on IV amiodarone for rapid atrial fib ? Planned Discharge Destination: Skilled nursing facility ? ? ? ?Time spent: 35 minutes ? ?Author: ?Kathie Dike, MD ?05/24/2021 11:36 PM ? ?For on call review [email or web address]  ?

## 2021-05-25 LAB — CBC
HCT: 37.6 % (ref 36.0–46.0)
Hemoglobin: 11.6 g/dL — ABNORMAL LOW (ref 12.0–15.0)
MCH: 31.5 pg (ref 26.0–34.0)
MCHC: 30.9 g/dL (ref 30.0–36.0)
MCV: 102.2 fL — ABNORMAL HIGH (ref 80.0–100.0)
Platelets: 191 10*3/uL (ref 150–400)
RBC: 3.68 MIL/uL — ABNORMAL LOW (ref 3.87–5.11)
RDW: 12.4 % (ref 11.5–15.5)
WBC: 5.1 10*3/uL (ref 4.0–10.5)
nRBC: 0 % (ref 0.0–0.2)

## 2021-05-25 LAB — BASIC METABOLIC PANEL
Anion gap: 9 (ref 5–15)
BUN: 34 mg/dL — ABNORMAL HIGH (ref 8–23)
CO2: 29 mmol/L (ref 22–32)
Calcium: 9 mg/dL (ref 8.9–10.3)
Chloride: 98 mmol/L (ref 98–111)
Creatinine, Ser: 1.15 mg/dL — ABNORMAL HIGH (ref 0.44–1.00)
GFR, Estimated: 50 mL/min — ABNORMAL LOW (ref >60)
Glucose, Bld: 125 mg/dL — ABNORMAL HIGH (ref 70–99)
Potassium: 4.3 mmol/L (ref 3.5–5.1)
Sodium: 136 mmol/L (ref 135–145)

## 2021-05-25 LAB — HEPARIN LEVEL (UNFRACTIONATED): Heparin Unfractionated: 0.5 IU/mL (ref 0.30–0.70)

## 2021-05-25 MED ORDER — METOPROLOL TARTRATE 25 MG PO TABS
12.5000 mg | ORAL_TABLET | Freq: Three times a day (TID) | ORAL | Status: DC
  Administered 2021-05-25 – 2021-05-27 (×7): 12.5 mg via ORAL
  Filled 2021-05-25 (×7): qty 1

## 2021-05-25 MED ORDER — SODIUM CHLORIDE 0.9 % IV SOLN
1.0000 g | Freq: Two times a day (BID) | INTRAVENOUS | Status: DC
  Administered 2021-05-25 – 2021-05-27 (×5): 1 g via INTRAVENOUS
  Filled 2021-05-25 (×5): qty 20

## 2021-05-25 MED ORDER — SODIUM CHLORIDE 0.9 % IV SOLN: INTRAVENOUS | Status: DC | PRN

## 2021-05-25 MED ORDER — DIPHENHYDRAMINE HCL 50 MG/ML IJ SOLN
12.5000 mg | Freq: Once | INTRAMUSCULAR | Status: AC
  Administered 2021-05-25: 12.5 mg via INTRAVENOUS
  Filled 2021-05-25: qty 1

## 2021-05-25 MED ORDER — APIXABAN 5 MG PO TABS
5.0000 mg | ORAL_TABLET | Freq: Two times a day (BID) | ORAL | Status: DC
  Administered 2021-05-25 – 2021-05-29 (×9): 5 mg via ORAL
  Filled 2021-05-25 (×9): qty 1

## 2021-05-25 MED ORDER — ORAL CARE MOUTH RINSE
15.0000 mL | Freq: Two times a day (BID) | OROMUCOSAL | Status: DC
  Administered 2021-05-25 – 2021-05-29 (×8): 15 mL via OROMUCOSAL

## 2021-05-25 MED ORDER — CHLORHEXIDINE GLUCONATE CLOTH 2 % EX PADS
6.0000 | MEDICATED_PAD | Freq: Every day | CUTANEOUS | Status: DC
  Administered 2021-05-26 – 2021-05-29 (×4): 6 via TOPICAL

## 2021-05-25 NOTE — Progress Notes (Signed)
? ?Progress Note ? ?Patient Name: Krystal Ali ?Date of Encounter: 05/25/2021 ? ?[facility] Cardiologist: New ? ?Subjective  ? ?No complaints ? ?Inpatient Medications  ?  ?Scheduled Meds: ? Chlorhexidine Gluconate Cloth  6 each Topical Q0600  ? diclofenac Sodium  2 g Topical QID  ? fluticasone  1 spray Each Nare Daily  ? gabapentin  600 mg Oral TID  ? melatonin  3 mg Oral QHS  ? mupirocin ointment  1 application. Nasal BID  ? polyethylene glycol  17 g Oral Daily  ? spironolactone  12.5 mg Oral Daily  ? tamsulosin  0.4 mg Oral QPC supper  ? umeclidinium bromide  1 puff Inhalation Daily  ? venlafaxine XR  150 mg Oral Q breakfast  ? ?Continuous Infusions: ? amiodarone 30 mg/hr (05/24/21 1840)  ? cefTRIAXone (ROCEPHIN)  IV 2 g (05/24/21 1720)  ? heparin 1,200 Units/hr (05/25/21 0438)  ? ?PRN Meds: ?acetaminophen **OR** acetaminophen, bisacodyl, HYDROcodone-acetaminophen, ipratropium-albuterol, ondansetron (ZOFRAN) IV  ? ?Vital Signs  ?  ?Vitals:  ? 05/25/21 0300 05/25/21 0500 05/25/21 0700 05/25/21 0800  ?BP: 126/70  (!) 108/57   ?Pulse: (!) 102  92 (!) 110  ?Resp: 20  17 (!) 23  ?Temp:  97.8 ?F (36.6 ?C) 98 ?F (36.7 ?C)   ?TempSrc:  Axillary Axillary   ?SpO2: (!) 88%  92% 90%  ?Weight:  84.6 kg    ?Height:      ? ? ?Intake/Output Summary (Last 24 hours) at 05/25/2021 0820 ?Last data filed at 05/25/2021 0300 ?Gross per 24 hour  ?Intake 1268.93 ml  ?Output 800 ml  ?Net 468.93 ml  ? ?Last 3 Weights 05/25/2021 05/23/2021 05/22/2021  ?Weight (lbs) 186 lb 8.2 oz 185 lb 13.6 oz 188 lb 4.4 oz  ?Weight (kg) 84.6 kg 84.3 kg 85.4 kg  ?   ? ?Telemetry  ?  ?Afib low 100s - Personally Reviewed ? ?ECG  ?  ?N/a - Personally Reviewed ? ?Physical Exam  ? ?GEN: No acute distress.   ?Neck: No JVD ?Cardiac: irreg, no m/rg, no jvd ?Respiratory: Clear to auscultation bilaterally. ?GI: Soft, nontender, non-distended  ?MS: No edema; No deformity. ?Neuro:  Nonfocal  ?Psych: Normal affect  ? ?Labs  ?  ?High Sensitivity Troponin:   ?Recent  Labs  ?Lab 05/21/21 ?1315 05/21/21 ?1501  ?TROPONINIHS 17 15  ?   ?Chemistry ?Recent Labs  ?Lab 05/21/21 ?1315 05/22/21 ?0133 05/23/21 ?0357 05/24/21 ?0434 05/25/21 ?0430  ?NA 137   < > 137 135 136  ?K 4.1   < > 3.4* 4.7 4.3  ?CL 102   < > 99 98 98  ?CO2 25   < > '28 28 29  '$ ?GLUCOSE 138*   < > 124* 127* 125*  ?BUN 26*   < > 28* 32* 34*  ?CREATININE 0.74   < > 0.87 1.34* 1.15*  ?CALCIUM 8.5*   < > 8.4* 8.7* 9.0  ?MG  --   --  1.1* 1.9  --   ?PROT 6.7  --   --   --   --   ?ALBUMIN 3.5  --   --   --   --   ?AST 14*  --   --   --   --   ?ALT 14  --   --   --   --   ?ALKPHOS 82  --   --   --   --   ?BILITOT 0.6  --   --   --   --   ?  GFRNONAA >60   < > >60 41* 50*  ?ANIONGAP 10   < > '10 9 9  '$ ? < > = values in this interval not displayed.  ?  ?Lipids No results for input(s): CHOL, TRIG, HDL, LABVLDL, LDLCALC, CHOLHDL in the last 168 hours.  ?Hematology ?Recent Labs  ?Lab 05/23/21 ?6568 05/24/21 ?0434 05/25/21 ?0430  ?WBC 4.3 5.2 5.1  ?RBC 3.48* 3.65* 3.68*  ?HGB 11.3* 12.0 11.6*  ?HCT 35.5* 37.8 37.6  ?MCV 102.0* 103.6* 102.2*  ?MCH 32.5 32.9 31.5  ?MCHC 31.8 31.7 30.9  ?RDW 12.7 12.8 12.4  ?PLT 186 221 191  ? ?Thyroid  ?Recent Labs  ?Lab 05/21/21 ?1315  ?TSH 2.946  ?  ?BNP ?Recent Labs  ?Lab 05/22/21 ?0133  ?BNP 269.0*  ?  ?DDimer No results for input(s): DDIMER in the last 168 hours.  ? ?Radiology  ?  ?No results found. ? ?Cardiac Studies  ? ? ?Patient Profile  ?   ?76 y.o. female hx of HTN, HLD, urinary retention and DMII who presented with worsening SOB and UTI found to be in new Afib with RVR for which Cardiology was consulted. ? ?Assessment & Plan  ?  ?1.Afib with RVR ?- new diagnosis this admissin ?- presented with afib with RVR in setting of UTI ? Initially on dilt gtt, hep gtt ?- evidence of systolic dysfunction, changed from diltiazem to amiodarone drip ?- HRs low 100s.BP's somewhat improved today, start lopressor 12.'5mg'$  every 8 hours, consolidate to toprol if tolerates. Could also load with digoxin pending rates  and resolution of AKI ?- may require TEE/DCCV if does not self convert and depending on rate control ?- change hep gtt to eliquis '5mg'$  bid.  ?  ?2.Acute systolic HF ?- evidence of pulmonary edema, volume overload on exam. ?- 05/2021 echo LVE 20-25%, global hypokinesis, mild to mod RV dysfunction. Mod BAE, mild MR ?- possibly tachy mediated given new diagnosis of afib with RVR ?  ?- somewhat labile bp's, soft at times. Recent AKI that is starting to resolved. Hold on beta blocker ACE/ARB/ARNI today. Started aldactone 12.'5mg'$  daily, SGLT2i likely later in admission.  ?- would focuse on afib/rhythm management and recheck echo 3 months. I think reasonable to delay ischemic testing given likely alternative diagnosis and no evidence of acute ischemia on her presentation ?  ?- diuretics on hold, significant uptrend in Cr yesterday that is resolving. Likely could start oral '40mg'$  daily tomorrow pending renal function.  ?  ?  ?3. UTI ?- per primary team ? ?Continue IV amio over the weekend, if does not self convert may need to consider TEE/DCCV ? ?For questions or updates, please contact [facility] ?Please consult [email or web address] for contact info under  ? ?  ?   ?Signed, ?Carlyle Dolly, MD  ?05/25/2021, 8:20 AM   ? ?

## 2021-05-25 NOTE — Plan of Care (Signed)
Patient remains in CCU on amiodarone infusion. ? ? ?Problem: Education: ?Goal: Knowledge of disease or condition will improve ?Outcome: Not Progressing ?Goal: Understanding of medication regimen will improve ?Outcome: Not Progressing ?Goal: Individualized Educational Video(s) ?Outcome: Not Progressing ?  ?Problem: Activity: ?Goal: Ability to tolerate increased activity will improve ?Outcome: Not Progressing ?  ?Problem: Cardiac: ?Goal: Ability to achieve and maintain adequate cardiopulmonary perfusion will improve ?Outcome: Not Progressing ?  ?Problem: Health Behavior/Discharge Planning: ?Goal: Ability to safely manage health-related needs after discharge will improve ?Outcome: Not Progressing ?  ?Problem: Education: ?Goal: Knowledge of General Education information will improve ?Description: Including pain rating scale, medication(s)/side effects and non-pharmacologic comfort measures ?Outcome: Not Progressing ?  ?Problem: Health Behavior/Discharge Planning: ?Goal: Ability to manage health-related needs will improve ?Outcome: Not Progressing ?  ?Problem: Clinical Measurements: ?Goal: Ability to maintain clinical measurements within normal limits will improve ?Outcome: Not Progressing ?Goal: Will remain free from infection ?Outcome: Not Progressing ?Goal: Diagnostic test results will improve ?Outcome: Not Progressing ?Goal: Respiratory complications will improve ?Outcome: Not Progressing ?Goal: Cardiovascular complication will be avoided ?Outcome: Not Progressing ?  ?Problem: Activity: ?Goal: Risk for activity intolerance will decrease ?Outcome: Not Progressing ?  ?Problem: Nutrition: ?Goal: Adequate nutrition will be maintained ?Outcome: Not Progressing ?  ?Problem: Coping: ?Goal: Level of anxiety will decrease ?Outcome: Not Progressing ?  ?Problem: Elimination: ?Goal: Will not experience complications related to bowel motility ?Outcome: Not Progressing ?Goal: Will not experience complications related to urinary  retention ?Outcome: Not Progressing ?  ?Problem: Pain Managment: ?Goal: General experience of comfort will improve ?Outcome: Not Progressing ?  ?Problem: Safety: ?Goal: Ability to remain free from injury will improve ?Outcome: Not Progressing ?  ?Problem: Skin Integrity: ?Goal: Risk for impaired skin integrity will decrease ?Outcome: Not Progressing ?  ?

## 2021-05-25 NOTE — Care Management Important Message (Signed)
Important Message ? ?Patient Details  ?Name: Krystal Ali ?MRN: [id number] ?Date of Birth: 1945-06-10 ? ? ?Medicare Important Message Given:  Yes ? ? ? ? ?Tommy Medal ?05/25/2021, 4:50 PM ?

## 2021-05-25 NOTE — Progress Notes (Addendum)
?Progress Note ? ? ?Patient: Krystal Ali [id number]:[id number] DOB: [birth year] DOA: 05/21/2021     4 ?DOS: the patient was seen and examined on 05/25/2021 ?  ?Brief hospital course: ?76 year old female with a history of diabetes mellitus type 2, hypertension, hyperlipidemia, anxiety, urinary retention presenting from [place] with 2-day history of worsening shortness of breath.  The patient has resided at [place] for about 9 months.  She is essentially wheelchair-bound.  However with activity she has noted increasing dyspnea on exertion.  She denies any fevers, chills, chest pain, syncope, dizziness, nausea, vomiting.  As result, the patient was brought to emergency department for further evaluation.  She was noted to have tachycardia with heart rate in the 140s. ?In the ED, the patient was afebrile and hemodynamically stable with oxygen saturation 98% on 2 L.  She was noted to have atrial fibrillation with RVR.  She was started on a diltiazem drip.  Chest x-ray showed pulmonary vascular congestion and bilateral pleural effusions.  Patient was started on IV heparin and IV diltiazem.  She also received furosemide 40 mg IV on 05/21/2021 and 05/22/2021.  Cardiology is following. ? ?Assessment and Plan: ?* Atrial fibrillation with RVR ([place]) ?Paroxysmal atrial fibrillation ?Initially on diltiazem drip>>amiodarone drip with echo showed low EF ?HR remains uncontrolled today and BP is labile. ?Cardiology following ?Continue on IV amiodarone through weekend ?Started on metoprolol today ?May need to consider TEE/cardioversion if does not spontaneously convert ?Continue IV heparin  ?Convert to oral NOAC prior to d/c ?TSH 2.946 ? ?Acute systolic CHF (congestive heart failure) ([place]) ?Lasix '40mg'$  IV bid started ?Cardiology following ?3/14 Echo--EF 20-25%, global HK, reduced RV systolic function, mild-mod TR ?Daily weights ?Accurate I/Os ?Since creatinine trending up, further diuretics have been held ?Per cardiology, could possibly start  oral Lasix tomorrow ? ?Acute respiratory failure with hypoxia ([place]) ?Presented with dyspnea, tachypnea and hypoxia 86% on RA ?Currently oxygen weaned down to 1L ?Due to pulmonary edema/pleural effusions ?Lasix '40mg'$  IV bid started, but held due to rising creatinine ?Cardiology following ?3/14 Echo--EF 20-25%, global HK, reduced RV systolic function, mild-mod TR ?Continue to wean off oxygen as tolerated ? ?Pyuria ?3/13 UA >50 WBC ?-empiric ceftriaxone ?-urine culture with ESBL E coli and enterococcus ?-ceftriaxone changed to merrem  ?-blood cultures from 3/13 with no growth ? ?Lumbar stenosis with neurogenic claudication ?S/P L4, L5 laminectomies, L5 and S1 pedicle screw placement, L2-S1 posterolateral fusion, L4-5 TLIF on 08/18/2020 (Dr. Zada Finders) ?Pt has been bed bound/WC bound for many months now ? ?Controlled type 2 diabetes mellitus without complication, without long-term current use of insulin ([place]) ?Not on any agents in the outpatient setting ?Repeat A1C ?09/18/20 A1C--5.5 ? ? ? ? ?  ? ?Subjective: She says she is feeling better today.  Denies any shortness of breath or chest pain. ? ?Physical Exam: ?Vitals:  ? 05/25/21 1900 05/25/21 1945 05/25/21 2000 05/25/21 2003  ?BP: 96/69     ?Pulse: 90 (!) 118 92 (!) 101  ?Resp: (!) 22 (!) 23 (!) 25 (!) 25  ?Temp:  97.7 ?F (36.5 ?C)  97.6 ?F (36.4 ?C)  ?TempSrc:  Oral  Oral  ?SpO2: 98% 95% 96% 96%  ?Weight:      ?Height:      ? ?General exam: Alert, awake, oriented x 3 ?Respiratory system: Clear to auscultation. Respiratory effort normal. ?Cardiovascular system: Irregular. No murmurs, rubs, gallops. ?Gastrointestinal system: Abdomen is nondistended, soft and nontender. No organomegaly or masses felt. Normal bowel sounds heard. ?Central nervous system: Alert  and oriented. No focal neurological deficits. ?Extremities: No C/C/E, +pedal pulses ?Skin: No rashes, lesions or ulcers ?Psychiatry: Judgement and insight appear normal. Mood & affect appropriate.  ? ?Data  Reviewed: ? ?Reviewed chemistry ? ?Family Communication: Updated patient's daughter over the phone ? ?Disposition: ?Status is: Inpatient ?Remains inpatient appropriate because: Continued IV amiodarone infusion ? Planned Discharge Destination: Skilled nursing facility ? ? ? ?Time spent: 35 minutes ? ?Author: ?Kathie Dike, MD ?05/25/2021 9:01 PM ? ?For on call review [email or web address].  ?

## 2021-05-25 NOTE — Progress Notes (Signed)
ANTICOAGULATION CONSULT NOTE -  ? ?Pharmacy Consult for Heparin ?Indication: atrial fibrillation ? ?Allergies  ?Allergen Reactions  ? Demerol [Meperidine Hcl]   ?  Agitation  ? Morphine And Related   ?  No reaction listed on nursing home record  ? ? ?Patient Measurements: ?Height: '5\' 5"'$  (165.1 cm) ?Weight: 84.6 kg (186 lb 8.2 oz) ?IBW/kg (Calculated) : 57 ?HEPARIN DW (KG): 63.5  ? ?Vital Signs: ?Temp: 98 ?F (36.7 ?C) (03/17 0700) ?Temp Source: Axillary (03/17 0700) ?BP: 126/70 (03/17 0300) ?Pulse Rate: 102 (03/17 0300) ? ?Labs: ?Recent Labs  ?  05/23/21 ?0034 05/24/21 ?0434 05/25/21 ?0430  ?HGB 11.3* 12.0 11.6*  ?HCT 35.5* 37.8 37.6  ?PLT 186 221 191  ?HEPARINUNFRC 0.51 0.37 0.50  ?CREATININE 0.87 1.34* 1.15*  ? ? ? ?Estimated Creatinine Clearance: 45.4 mL/min (A) (by C-G formula based on SCr of 1.15 mg/dL (H)). ? ? ?Medical History: ?Past Medical History:  ?Diagnosis Date  ? Anxiety   ? Diabetes mellitus without complication ([place])   ? GERD (gastroesophageal reflux disease)   ? Hyperlipidemia   ? Hypertension   ? Retention of urine, unspecified   ? 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 ? ?Medications:  ?See med rec ? ?Assessment: ?76 y.o. female with a hx of HTN, HLD and DMII who is being seen 05/21/2021 for the evaluation of Afib with RVR, new onset. Patient is not on any oral anticoagulants. Pharmacy asked to start heparin ? ?HL 0.50- therapeutic ?CBC WNL ? ?Goal of Therapy:  ?Heparin level 0.3-0.7 units/ml ?Monitor platelets by anticoagulation protocol: Yes ?  ?Plan:  ?Continue heparin infusion at 1200 units/hr ?Check anti-Xa level daily while on heparin ?Continue to monitor H&H and platelets ? ?Margot Ables, PharmD ?Clinical Pharmacist ?05/25/2021 8:03 AM ? ? ? ?

## 2021-05-26 LAB — BASIC METABOLIC PANEL
Anion gap: 8 (ref 5–15)
BUN: 38 mg/dL — ABNORMAL HIGH (ref 8–23)
CO2: 26 mmol/L (ref 22–32)
Calcium: 8.8 mg/dL — ABNORMAL LOW (ref 8.9–10.3)
Chloride: 100 mmol/L (ref 98–111)
Creatinine, Ser: 1.11 mg/dL — ABNORMAL HIGH (ref 0.44–1.00)
GFR, Estimated: 52 mL/min — ABNORMAL LOW (ref >60)
Glucose, Bld: 108 mg/dL — ABNORMAL HIGH (ref 70–99)
Potassium: 4.6 mmol/L (ref 3.5–5.1)
Sodium: 134 mmol/L — ABNORMAL LOW (ref 135–145)

## 2021-05-26 LAB — CBC
HCT: 37.6 % (ref 36.0–46.0)
Hemoglobin: 11.5 g/dL — ABNORMAL LOW (ref 12.0–15.0)
MCH: 31.3 pg (ref 26.0–34.0)
MCHC: 30.6 g/dL (ref 30.0–36.0)
MCV: 102.2 fL — ABNORMAL HIGH (ref 80.0–100.0)
Platelets: 196 10*3/uL (ref 150–400)
RBC: 3.68 MIL/uL — ABNORMAL LOW (ref 3.87–5.11)
RDW: 12.3 % (ref 11.5–15.5)
WBC: 5.6 10*3/uL (ref 4.0–10.5)
nRBC: 0 % (ref 0.0–0.2)

## 2021-05-26 LAB — URINE CULTURE: Culture: >=100000 — AB

## 2021-05-26 LAB — CULTURE, BLOOD (ROUTINE X 2)
Culture: NO GROWTH
Culture: NO GROWTH
Special Requests: ADEQUATE

## 2021-05-26 MED ORDER — HYDROCORTISONE 1 % EX CREA
TOPICAL_CREAM | Freq: Three times a day (TID) | CUTANEOUS | Status: DC
  Filled 2021-05-26: qty 28

## 2021-05-26 MED ORDER — FUROSEMIDE 40 MG PO TABS
40.0000 mg | ORAL_TABLET | Freq: Every day | ORAL | Status: DC
  Administered 2021-05-27 – 2021-05-29 (×3): 40 mg via ORAL
  Filled 2021-05-26 (×2): qty 1

## 2021-05-26 NOTE — Progress Notes (Signed)
?Progress Note ? ? ?Patient: Krystal Ali AVW:[id number] DOB: 11/22/1945 DOA: 05/21/2021     5 ?DOS: the patient was seen and examined on 05/26/2021 ?  ?Brief hospital course: ?76 year old female with a history of diabetes mellitus type 2, hypertension, hyperlipidemia, anxiety, urinary retention presenting from [place] with 2-day history of worsening shortness of breath.  The patient has resided at [facility] for about 9 months.  She is essentially wheelchair-bound.  However with activity she has noted increasing dyspnea on exertion.  She denies any fevers, chills, chest pain, syncope, dizziness, nausea, vomiting.  As result, the patient was brought to emergency department for further evaluation.  She was noted to have tachycardia with heart rate in the 140s. ?In the ED, the patient was afebrile and hemodynamically stable with oxygen saturation 98% on 2 L.  She was noted to have atrial fibrillation with RVR.  She was started on a diltiazem drip.  Chest x-ray showed pulmonary vascular congestion and bilateral pleural effusions.  Patient was started on IV heparin and IV diltiazem.  She also received furosemide 40 mg IV on 05/21/2021 and 05/22/2021.  Cardiology is following. ? ?Assessment and Plan: ?* Atrial fibrillation with RVR ([place]) ?Paroxysmal atrial fibrillation ?Initially on diltiazem drip>>amiodarone drip with echo showed low EF ?HR remains in the 90s to low 100s in atrial fibrillation ?Cardiology following ?Continue on IV amiodarone through weekend ?Started on metoprolol  ?May need to consider TEE/cardioversion if does not spontaneously convert ?She is anticoagulated with Eliquis ?TSH 2.946 ? ?Acute systolic CHF (congestive heart failure) ([place]) ?Lasix '40mg'$  IV bid started ?Cardiology following ?3/14 Echo--EF 20-25%, global HK, reduced RV systolic function, mild-mod TR ?Daily weights ?Accurate I/Os ?Diuretics transiently held since creatinine trending up ?-Creatinine has since improved ?We will resume low-dose  furosemide ? ?Acute respiratory failure with hypoxia ([place]) ?Presented with dyspnea, tachypnea and hypoxia 86% on RA ?Currently oxygen weaned down to 1L ?Due to pulmonary edema/pleural effusions ?Lasix '40mg'$  IV bid started, but held due to rising creatinine ?Cardiology following ?3/14 Echo--EF 20-25%, global HK, reduced RV systolic function, mild-mod TR ?Continue to wean off oxygen as tolerated ? ?Pyuria ?3/13 UA >50 WBC ?-empiric ceftriaxone ?-urine culture with ESBL E coli and enterococcus ?-ceftriaxone changed to merrem  ?-blood cultures from 3/13 with no growth ? ?Lumbar stenosis with neurogenic claudication ?S/P L4, L5 laminectomies, L5 and S1 pedicle screw placement, L2-S1 posterolateral fusion, L4-5 TLIF on 08/18/2020 (Dr. Zada Finders) ?Pt has been bed bound/WC bound for many months now ? ?Controlled type 2 diabetes mellitus without complication, without long-term current use of insulin ([place]) ?Not on any agents in the outpatient setting ?Repeat A1C ?09/18/20 A1C--5.5 ? ? ? ? ?  ? ?Subjective: She feels well.  Denies any shortness of breath or chest pain. ? ?Physical Exam: ?Vitals:  ? 05/26/21 0700 05/26/21 0800 05/26/21 0819 05/26/21 1600  ?BP: 110/68 (!) 64/45    ?Pulse: 86 (!) 46 92   ?Resp: (!) 24 19 (!) 23   ?Temp:   (!) 97.3 ?F (36.3 ?C) 97.6 ?F (36.4 ?C)  ?TempSrc:   Axillary Oral  ?SpO2: 95% 98% 99%   ?Weight:      ?Height:      ? ?General exam: Alert, awake, oriented x 3 ?Respiratory system: Clear to auscultation. Respiratory effort normal. ?Cardiovascular system: Irregular. No murmurs, rubs, gallops. ?Gastrointestinal system: Abdomen is nondistended, soft and nontender. No organomegaly or masses felt. Normal bowel sounds heard. ?Central nervous system: Alert and oriented. No focal neurological deficits. ?Extremities: 1+ edema  bilaterally ?Skin: No rashes, lesions or ulcers ?Psychiatry: Judgement and insight appear normal. Mood & affect appropriate.  ? ?Data Reviewed: ? ?Reviewed CBC and chemistry ? ?Family  Communication: Updated patient's daughter and granddaughter at the bedside ? ?Disposition: ?Status is: Inpatient ?Remains inpatient appropriate because: Continued IV amiodarone for uncontrolled atrial fibrillation ? Planned Discharge Destination: Skilled nursing facility ? ? ? ?Time spent: 35 minutes ? ?Author: ?Kathie Dike, MD ?05/26/2021 8:03 PM ? ?For on call review [email or web address]  ?

## 2021-05-26 NOTE — Discharge Instructions (Signed)

## 2021-05-26 NOTE — Plan of Care (Signed)

## 2021-05-27 LAB — BASIC METABOLIC PANEL
Anion gap: 8 (ref 5–15)
BUN: 39 mg/dL — ABNORMAL HIGH (ref 8–23)
CO2: 27 mmol/L (ref 22–32)
Calcium: 8.8 mg/dL — ABNORMAL LOW (ref 8.9–10.3)
Chloride: 99 mmol/L (ref 98–111)
Creatinine, Ser: 0.94 mg/dL (ref 0.44–1.00)
GFR, Estimated: >60 mL/min (ref >60)
Glucose, Bld: 112 mg/dL — ABNORMAL HIGH (ref 70–99)
Potassium: 4.5 mmol/L (ref 3.5–5.1)
Sodium: 134 mmol/L — ABNORMAL LOW (ref 135–145)

## 2021-05-27 LAB — CBC
HCT: 37.3 % (ref 36.0–46.0)
Hemoglobin: 11.8 g/dL — ABNORMAL LOW (ref 12.0–15.0)
MCH: 32.5 pg (ref 26.0–34.0)
MCHC: 31.6 g/dL (ref 30.0–36.0)
MCV: 102.8 fL — ABNORMAL HIGH (ref 80.0–100.0)
Platelets: 187 10*3/uL (ref 150–400)
RBC: 3.63 MIL/uL — ABNORMAL LOW (ref 3.87–5.11)
RDW: 12.3 % (ref 11.5–15.5)
WBC: 5.8 10*3/uL (ref 4.0–10.5)
nRBC: 0 % (ref 0.0–0.2)

## 2021-05-27 MED ORDER — SODIUM CHLORIDE 0.9 % IV SOLN
1.0000 g | Freq: Three times a day (TID) | INTRAVENOUS | Status: DC
  Administered 2021-05-27 – 2021-05-29 (×6): 1 g via INTRAVENOUS
  Filled 2021-05-27 (×6): qty 20

## 2021-05-27 NOTE — Progress Notes (Signed)
?Progress Note ? ? ?Patient: Krystal Ali LKG:[id number] DOB: 04-Nov-1945 DOA: 05/21/2021     6 ?DOS: the patient was seen and examined on 05/27/2021 ?  ?Brief hospital course: ?76 year old female with a history of diabetes mellitus type 2, hypertension, hyperlipidemia, anxiety, urinary retention presenting from [place] with 2-day history of worsening shortness of breath.  The patient has resided at [facility] for about 9 months.  She is essentially wheelchair-bound.  However with activity she has noted increasing dyspnea on exertion.  She denies any fevers, chills, chest pain, syncope, dizziness, nausea, vomiting.  As result, the patient was brought to emergency department for further evaluation.  She was noted to have tachycardia with heart rate in the 140s. ?In the ED, the patient was afebrile and hemodynamically stable with oxygen saturation 98% on 2 L.  She was noted to have atrial fibrillation with RVR.  She was started on a diltiazem drip.  Chest x-ray showed pulmonary vascular congestion and bilateral pleural effusions.  Patient was started on IV heparin and IV diltiazem.  She also received furosemide 40 mg IV on 05/21/2021 and 05/22/2021.  Cardiology is following. ? ?Assessment and Plan: ?* Atrial fibrillation with RVR ([place]) ?Paroxysmal atrial fibrillation ?Initially on diltiazem drip>>amiodarone drip with echo showed low EF ?HR remains in the 90s to low 100s in atrial fibrillation ?Cardiology following ?Continue on IV amiodarone through weekend ?Started on metoprolol  ?May need to consider TEE/cardioversion if does not spontaneously convert ?She is anticoagulated with Eliquis ?TSH 2.946 ? ?Acute systolic CHF (congestive heart failure) ([place]) ?Initially Lasix '40mg'$  IV bid started ?Cardiology following ?3/14 Echo--EF 20-25%, global HK, reduced RV systolic function, mild-mod TR ?Daily weights ?Accurate I/Os ?Diuretics transiently held since creatinine trending up ?-Creatinine has since improved ?Restarted on oral  lasix ? ?Acute respiratory failure with hypoxia ([place]) ?Presented with dyspnea, tachypnea and hypoxia 86% on RA ?Currently oxygen weaned down to 1L ?Due to pulmonary edema/pleural effusions ?Lasix '40mg'$  IV bid started, but held due to rising creatinine ?Cardiology following ?3/14 Echo--EF 20-25%, global HK, reduced RV systolic function, mild-mod TR ?Continue to wean off oxygen as tolerated ? ?UTI (urinary tract infection) ?3/13 UA >50 WBC ?-empiric ceftriaxone ?-urine culture with ESBL E coli and enterococcus ?-ceftriaxone changed to merrem (day 3)  ?-blood cultures from 3/13 with no growth ? ?Lumbar stenosis with neurogenic claudication ?S/P L4, L5 laminectomies, L5 and S1 pedicle screw placement, L2-S1 posterolateral fusion, L4-5 TLIF on 08/18/2020 (Dr. Zada Finders) ?Pt has been bed bound/WC bound for many months now ? ?Controlled type 2 diabetes mellitus without complication, without long-term current use of insulin ([place]) ?Not on any agents in the outpatient setting ?Repeat A1C ?09/18/20 A1C--5.5 ? ? ? ? ?  ? ?Subjective: Denies any shortness of breath.  Overall she is feeling better.  No chest pain. ? ?Physical Exam: ?Vitals:  ? 05/27/21 1600 05/27/21 1700 05/27/21 1800 05/27/21 1845  ?BP: (!) 100/59   92/70  ?Pulse: 95 (!) 47 94 (!) 40  ?Resp: (!) 23 (!) 23  (!) 26  ?Temp:      ?TempSrc:      ?SpO2: 91% 90% 93% 95%  ?Weight:      ?Height:      ?General exam: Alert, awake, oriented x 3 ?Respiratory system: Clear to auscultation. Respiratory effort normal. ?Cardiovascular system: Irregular no murmurs, rubs, gallops. ?Gastrointestinal system: Abdomen is nondistended, soft and nontender. No organomegaly or masses felt. Normal bowel sounds heard. ?Central nervous system: Alert and oriented. No focal neurological deficits. ?Extremities:  No C/C/E, +pedal pulses ?Skin: No rashes, lesions or ulcers ?Psychiatry: Judgement and insight appear normal. Mood & affect appropriate.  ? ? ?Data Reviewed: ? ?Reviewed CBC and chemistry .   Creatinine normal at 0.9.  Hemoglobin is stable at 11.8. ? ?Family Communication: Discussed with grandchildren at the bedside ? ?Disposition: ?Status is: Inpatient ?Remains inpatient appropriate because: Continued IV amiodarone for rapid atrial fibrillation ? Planned Discharge Destination: Skilled nursing facility ? ? ? ?Time spent: 35 minutes ? ?Author: ?Kathie Dike, MD ?05/27/2021 6:57 PM ? ?For on call review [email or web address]  ?

## 2021-05-27 NOTE — Plan of Care (Signed)

## 2021-05-28 DIAGNOSIS — I5020 Unspecified systolic (congestive) heart failure: Secondary | ICD-10-CM

## 2021-05-28 DIAGNOSIS — I4819 Other persistent atrial fibrillation: Secondary | ICD-10-CM

## 2021-05-28 LAB — BASIC METABOLIC PANEL
Anion gap: 9 (ref 5–15)
BUN: 41 mg/dL — ABNORMAL HIGH (ref 8–23)
CO2: 26 mmol/L (ref 22–32)
Calcium: 8.3 mg/dL — ABNORMAL LOW (ref 8.9–10.3)
Chloride: 97 mmol/L — ABNORMAL LOW (ref 98–111)
Creatinine, Ser: 1.01 mg/dL — ABNORMAL HIGH (ref 0.44–1.00)
GFR, Estimated: 58 mL/min — ABNORMAL LOW (ref >60)
Glucose, Bld: 117 mg/dL — ABNORMAL HIGH (ref 70–99)
Potassium: 4.5 mmol/L (ref 3.5–5.1)
Sodium: 132 mmol/L — ABNORMAL LOW (ref 135–145)

## 2021-05-28 LAB — CBC
HCT: 37.4 % (ref 36.0–46.0)
Hemoglobin: 11.6 g/dL — ABNORMAL LOW (ref 12.0–15.0)
MCH: 31.4 pg (ref 26.0–34.0)
MCHC: 31 g/dL (ref 30.0–36.0)
MCV: 101.4 fL — ABNORMAL HIGH (ref 80.0–100.0)
Platelets: 181 10*3/uL (ref 150–400)
RBC: 3.69 MIL/uL — ABNORMAL LOW (ref 3.87–5.11)
RDW: 12.2 % (ref 11.5–15.5)
WBC: 5.1 10*3/uL (ref 4.0–10.5)
nRBC: 0 % (ref 0.0–0.2)

## 2021-05-28 MED ORDER — METOPROLOL SUCCINATE ER 25 MG PO TB24
25.0000 mg | ORAL_TABLET | Freq: Every day | ORAL | Status: DC
  Administered 2021-05-28 – 2021-05-29 (×2): 25 mg via ORAL
  Filled 2021-05-28 (×2): qty 1

## 2021-05-28 MED ORDER — AMIODARONE HCL 200 MG PO TABS
400.0000 mg | ORAL_TABLET | Freq: Two times a day (BID) | ORAL | Status: DC
  Administered 2021-05-28 – 2021-05-29 (×3): 400 mg via ORAL
  Filled 2021-05-28 (×3): qty 2

## 2021-05-28 NOTE — Progress Notes (Signed)
?Progress Note ? ? ?Patient: Krystal Ali HFW:[id number] DOB: 05-Sep-1945 DOA: 05/21/2021     7 ?DOS: the patient was seen and examined on 05/28/2021 ?  ?Brief hospital course: ?76 year old female with a history of diabetes mellitus type 2, hypertension, hyperlipidemia, anxiety, urinary retention presenting from [facility] with 2-day history of worsening shortness of breath.  The patient has resided at [facility] for about 9 months.  She is essentially wheelchair-bound.  However with activity she has noted increasing dyspnea on exertion.  She denies any fevers, chills, chest pain, syncope, dizziness, nausea, vomiting.  As result, the patient was brought to emergency department for further evaluation.  She was noted to have tachycardia with heart rate in the 140s. ?In the ED, the patient was afebrile and hemodynamically stable with oxygen saturation 98% on 2 L.  She was noted to have atrial fibrillation with RVR.  She was started on a diltiazem drip.  Chest x-ray showed pulmonary vascular congestion and bilateral pleural effusions.  Patient was started on IV heparin and IV diltiazem.  She also received furosemide 40 mg IV on 05/21/2021 and 05/22/2021.  Cardiology is following. ? ?Assessment and Plan: ?* Atrial fibrillation with RVR ([place]) ?Paroxysmal atrial fibrillation ?Initially on diltiazem drip>>amiodarone drip with echo showed low EF ?HR remains in the 90s to low 100s in atrial fibrillation ?Cardiology following ?She has been on IV amiodarone infusion, plans to transition to p.o. since heart rate appears to be stabilizing ?Continue on metoprolol  ?We will hold off on TEE/cardioversion at this time since heart rate appears to be stabilizing ?She is anticoagulated with Eliquis ?TSH 2.946 ? ?Acute systolic CHF (congestive heart failure) ([place]) ?Initially Lasix '40mg'$  IV bid started ?Cardiology following ?3/14 Echo--EF 20-25%, global HK, reduced RV systolic function, mild-mod TR ?Daily weights ?Accurate I/Os ?Diuretics  transiently held since creatinine trending up ?-Creatinine has since improved ?Restarted on oral lasix ? ?Acute respiratory failure with hypoxia ([place]) ?Presented with dyspnea, tachypnea and hypoxia 86% on RA ?Currently oxygen weaned down to 2L ?Due to pulmonary edema/pleural effusions ?Lasix '40mg'$  IV bid started, but held due to rising creatinine ?Cardiology following ?She has been restarted on oral Lasix ?3/14 Echo--EF 20-25%, global HK, reduced RV systolic function, mild-mod TR ?Continue to wean off oxygen as tolerated ? ?UTI (urinary tract infection) ?3/13 UA >50 WBC ?-empiric ceftriaxone ?-urine culture with ESBL E coli and enterococcus ?-ceftriaxone changed to merrem (day 4)  ?-blood cultures from 3/13 with no growth ? ?Lumbar stenosis with neurogenic claudication ?S/P L4, L5 laminectomies, L5 and S1 pedicle screw placement, L2-S1 posterolateral fusion, L4-5 TLIF on 08/18/2020 (Dr. Zada Finders) ?Pt has been bed bound/WC bound for many months now ? ?Controlled type 2 diabetes mellitus without complication, without long-term current use of insulin ([place]) ?Not on any agents in the outpatient setting ?Repeat A1C ?09/18/20 A1C--5.5 ? ? ? ? ?  ? ?Subjective: She says she feels tired.  Does not have any chest pain or shortness of breath. ? ?Physical Exam: ?Vitals:  ? 05/28/21 1300 05/28/21 1400 05/28/21 1616 05/28/21 1900  ?BP: 130/67 117/62  120/77  ?Pulse:  66  94  ?Resp: (!) 26 17  (!) 30  ?Temp:   97.6 ?F (36.4 ?C)   ?TempSrc:   Axillary   ?SpO2:    98%  ?Weight:      ?Height:      ? ?General exam: Alert, awake, oriented x 3 ?Respiratory system: Clear to auscultation. Respiratory effort normal. ?Cardiovascular system: Irregular. No murmurs, rubs, gallops. ?Gastrointestinal  system: Abdomen is nondistended, soft and nontender. No organomegaly or masses felt. Normal bowel sounds heard. ?Central nervous system: Alert and oriented. No focal neurological deficits. ?Extremities: No C/C/E, +pedal pulses ?Skin: No rashes,  lesions or ulcers ?Psychiatry: Judgement and insight appear normal. Mood & affect appropriate.  ? ?Data Reviewed: ? ?Reviewed chemistry, renal function relatively stable ? ?Family Communication: Discussed with grandchildren at the bedside ? ?Disposition: ?Status is: Inpatient ?Remains inpatient appropriate because: Continued monitoring of heart rate to ensure hemodynamic stability ? Planned Discharge Destination: Skilled nursing facility ? ? ? ?Time spent: 35 minutes ? ?Author: ?Kathie Dike, MD ?05/28/2021 8:02 PM ? ?For on call review [email or web address].  ?

## 2021-05-28 NOTE — Progress Notes (Signed)
? ?Progress Note ? ?Patient Name: Krystal Ali ?Date of Encounter: 05/28/2021 ? ?Primary Cardiologist: Freada Bergeron, MD ? ?Subjective  ? ?No reported palpitations or breathlessness at baseline.  Did not sleep well last night. ? ?Inpatient Medications  ?  ?Scheduled Meds: ? apixaban  5 mg Oral BID  ? Chlorhexidine Gluconate Cloth  6 each Topical Daily  ? diclofenac Sodium  2 g Topical QID  ? fluticasone  1 spray Each Nare Daily  ? furosemide  40 mg Oral Daily  ? gabapentin  600 mg Oral TID  ? hydrocortisone cream   Topical TID  ? mouth rinse  15 mL Mouth Rinse BID  ? melatonin  3 mg Oral QHS  ? metoprolol tartrate  12.5 mg Oral TID  ? polyethylene glycol  17 g Oral Daily  ? spironolactone  12.5 mg Oral Daily  ? tamsulosin  0.4 mg Oral QPC supper  ? umeclidinium bromide  1 puff Inhalation Daily  ? venlafaxine XR  150 mg Oral Q breakfast  ? ?Continuous Infusions: ? sodium chloride Stopped (05/27/21 0521)  ? amiodarone 30 mg/hr (05/28/21 0200)  ? meropenem (MERREM) IV 1 g (05/27/21 2336)  ? ?PRN Meds: ?sodium chloride, acetaminophen **OR** acetaminophen, bisacodyl, HYDROcodone-acetaminophen, ipratropium-albuterol, ondansetron (ZOFRAN) IV  ? ?Vital Signs  ?  ?Vitals:  ? 05/28/21 0500 05/28/21 0600 05/28/21 0700 05/28/21 0724  ?BP: 103/79 103/74 124/83   ?Pulse: 92 91 90 88  ?Resp: (!) 24 20 (!) 27 17  ?Temp:    97.8 ?F (36.6 ?C)  ?TempSrc:    Oral  ?SpO2: 97% 97% 98% 96%  ?Weight:      ?Height:      ? ? ?Intake/Output Summary (Last 24 hours) at 05/28/2021 0855 ?Last data filed at 05/28/2021 0600 ?Gross per 24 hour  ?Intake 714.19 ml  ?Output 1850 ml  ?Net -1135.81 ml  ? ?Filed Weights  ? 05/26/21 0631 05/27/21 0500 05/28/21 0425  ?Weight: 84.6 kg 84.9 kg 85.9 kg  ? ? ?Telemetry  ?  ?Atrial fibrillation with heart rate in the 90s.  Personally reviewed. ? ?ECG  ?  ?No ECG reviewed. ? ?Physical Exam  ? ?GEN: No acute distress.   ?Neck: No JVD. ?Cardiac: Irregularly irregular, no murmur or gallop.  ?Respiratory:  Nonlabored. Clear to auscultation bilaterally. ?GI: Soft, nontender, bowel sounds present. ?MS: No edema; No deformity. ?Neuro:  Nonfocal. ?Psych: Alert and oriented x 3. Normal affect. ? ?Labs  ?  ?Chemistry ?Recent Labs  ?Lab 05/21/21 ?1315 05/22/21 ?0133 05/26/21 ?8413 05/27/21 ?2440 05/28/21 ?1027  ?NA 137   < > 134* 134* 132*  ?K 4.1   < > 4.6 4.5 4.5  ?CL 102   < > 100 99 97*  ?CO2 25   < > '26 27 26  '$ ?GLUCOSE 138*   < > 108* 112* 117*  ?BUN 26*   < > 38* 39* 41*  ?CREATININE 0.74   < > 1.11* 0.94 1.01*  ?CALCIUM 8.5*   < > 8.8* 8.8* 8.3*  ?PROT 6.7  --   --   --   --   ?ALBUMIN 3.5  --   --   --   --   ?AST 14*  --   --   --   --   ?ALT 14  --   --   --   --   ?ALKPHOS 82  --   --   --   --   ?BILITOT 0.6  --   --   --   --   ?  GFRNONAA >60   < > 52* >60 58*  ?ANIONGAP 10   < > '8 8 9  '$ ? < > = values in this interval not displayed.  ?  ? ?Hematology ?Recent Labs  ?Lab 05/26/21 ?0416 05/27/21 ?0428 05/28/21 ?0342  ?WBC 5.6 5.8 5.1  ?RBC 3.68* 3.63* 3.69*  ?HGB 11.5* 11.8* 11.6*  ?HCT 37.6 37.3 37.4  ?MCV 102.2* 102.8* 101.4*  ?MCH 31.3 32.5 31.4  ?MCHC 30.6 31.6 31.0  ?RDW 12.3 12.3 12.2  ?PLT 196 187 181  ? ? ?Cardiac Enzymes ?Recent Labs  ?Lab 05/21/21 ?1315 05/21/21 ?1501  ?TROPONINIHS 17 15  ? ? ?BNP ?Recent Labs  ?Lab 05/22/21 ?0133  ?BNP 269.0*  ?  ? ?Radiology  ?  ?No results found. ? ?Cardiac Studies  ? ?Echocardiogram 05/22/2021: ? 1. Left ventricular ejection fraction, by estimation, is 20 to 25%. The  ?left ventricle has severely decreased function. The left ventricle  ?demonstrates global hypokinesis. Diastolic function indeterminant due to  ?Afib. Elevated left atrial pressure.  ? 2. Right ventricular systolic function mildly-to-moderately reduced. The  ?right ventricular size is normal. There is moderately elevated pulmonary  ?artery systolic pressure. The estimated right ventricular systolic  ?pressure is 28.3 mmHg.  ? 3. Left atrial size was moderately dilated.  ? 4. Right atrial size was  moderately dilated.  ? 5. The mitral valve is abnormal. Mild mitral valve regurgitation.  ? 6. Tricuspid valve regurgitation is mild to moderate.  ? 7. The aortic valve is tricuspid. There is mild calcification of the  ?aortic valve. There is mild thickening of the aortic valve. Aortic valve  ?regurgitation is not visualized. Aortic valve sclerosis/calcification is  ?present, without any evidence of  ?aortic stenosis.  ? 8. The inferior vena cava is dilated in size with <50% respiratory  ?variability, suggesting right atrial pressure of 15 mmHg.  ? ?Assessment & Plan  ?  ?I reviewed the chart and cardiology evaluation as well as plans from last week.  Discussed situation with the patient and her daughter at bedside today. ? ?1.  Persistent atrial fibrillation presenting with RVR in the setting of UTI.  She has newly diagnosed cardiomyopathy, CHA2DS2-VASc score is 7.  She is now on Eliquis for stroke prophylaxis and has been continued on IV amiodarone along with low-dose oral Lopressor.  Heart rate is better controlled at this point.  She is fairly functionally limited at baseline. ? ?2. HFrEF with newly diagnosed cardiomyopathy, LVEF 20 to 25% range with mildly to moderately reduced RV contraction as well.  Possibly tachycardia induced.  High-sensitivity troponin I levels are normal arguing against ACS. ? ?3.  UTI with cultures growing E. coli and Enterococcus, currently on ceftriaxone. ? ?4.  Lumbar stenosis with neurogenic claudication and prior lumbar surgery, patient largely bedbound and wheelchair-bound at this point. ? ?Would hold off on TEE cardioversion at this time given better heart rate control and more opportunity to further advance medical therapy.  May ultimately require a cardioversion, however suspect that her risk of recurrent atrial fibrillation remains high and we need to get her on good oral therapy going forward either way.  We will transition to oral amiodarone load, switch from Lopressor to  Toprol-XL. Continue Eliquis, Lasix, and Aldactone. ? ?Signed, ?[name], MD  ?05/28/2021, 8:55 AM    ?

## 2021-05-28 NOTE — Plan of Care (Signed)

## 2021-05-29 LAB — BASIC METABOLIC PANEL
Anion gap: 8 (ref 5–15)
BUN: 43 mg/dL — ABNORMAL HIGH (ref 8–23)
CO2: 30 mmol/L (ref 22–32)
Calcium: 8.5 mg/dL — ABNORMAL LOW (ref 8.9–10.3)
Chloride: 99 mmol/L (ref 98–111)
Creatinine, Ser: 0.98 mg/dL (ref 0.44–1.00)
GFR, Estimated: >60 mL/min (ref >60)
Glucose, Bld: 87 mg/dL (ref 70–99)
Potassium: 4.5 mmol/L (ref 3.5–5.1)
Sodium: 137 mmol/L (ref 135–145)

## 2021-05-29 LAB — GLUCOSE, CAPILLARY: Glucose-Capillary: 101 mg/dL — ABNORMAL HIGH (ref 70–99)

## 2021-05-29 MED ORDER — APIXABAN 5 MG PO TABS: 5.0000 mg | ORAL_TABLET | Freq: Two times a day (BID) | ORAL | Status: AC

## 2021-05-29 MED ORDER — SPIRONOLACTONE 25 MG PO TABS: 12.5000 mg | ORAL_TABLET | Freq: Every day | ORAL | Status: DC

## 2021-05-29 MED ORDER — AMIODARONE HCL 400 MG PO TABS: ORAL_TABLET | ORAL | Status: DC

## 2021-05-29 MED ORDER — METOPROLOL SUCCINATE ER 25 MG PO TB24: 25.0000 mg | ORAL_TABLET | Freq: Every day | ORAL | Status: DC

## 2021-05-29 MED ORDER — FUROSEMIDE 40 MG PO TABS: 40.0000 mg | ORAL_TABLET | Freq: Every day | ORAL | Status: AC

## 2021-05-29 NOTE — TOC Transition Note (Signed)
Transition of Care (TOC) - CM/SW Discharge Note ? ? ?Patient Details  ?Name: Krystal Ali ?MRN: [id number] ?Date of Birth: 08-01-1945 ? ?Transition of Care (TOC) CM/SW Contact:  ?Shade Flood, LCSW ?Phone Number: ?05/29/2021, 10:51 AM ? ? ?Clinical Narrative:    ? ?Pt stable for dc back to [facility] today per MD. Updated Debbie at [facility] and pt can return today. DC clinical will be sent electronically. RN to call report. Will arrange EMS once dc orders/summary complete. ? ?Updated pt's daughter, Leretha Dykes, who is in agreement with dc plan. ? ?Final next level of care: [facility] ?Barriers to Discharge: Barriers Resolved ? ? ?Patient Goals and CMS Choice ?Patient states their goals for this hospitalization and ongoing recovery are:: Return to LTC facility ?  ?  ? ?Discharge Placement ?  ?           ?  ?Patient to be transferred to facility by: EMS ?Name of family member notified: Stefanie ?Patient and family notified of of transfer: 05/29/21 ? ?Discharge Plan and Services ?In-house Referral: Clinical Social Work ?  ?Post Acute Care Choice: Nursing Home          ?  ?  ?  ?  ?  ?  ?  ?  ?  ?  ? ?Social Determinants of Health (SDOH) Interventions ?  ? ? ?Readmission Risk Interventions ?No flowsheet data found. ? ? ? ? ?

## 2021-05-29 NOTE — Discharge Summary (Signed)
?Physician Discharge Summary ?  ?Patient: Krystal Ali MRN: [id number] DOB: 09-13-1945  ?Admit date:     05/21/2021  ?Discharge date: 05/29/21  ?Discharge Physician: Kathie Dike  ? ?PCP: Hal Morales, DO  ? ?Recommendations at discharge:  ? ? Patient will follow up with cardiology for CHF and A fib ?Repeat BMET in 1 week to follow renal function ? ?Discharge Diagnoses: ?Principal Problem: ?  Atrial fibrillation with RVR ([place]) ?Active Problems: ?  Acute systolic CHF (congestive heart failure) ([place]) ?  Acute respiratory failure with hypoxia ([place]) ?  UTI (urinary tract infection) ?  Controlled type 2 diabetes mellitus without complication, without long-term current use of insulin ([place]) ?  Lumbar stenosis with neurogenic claudication ? ?Resolved Problems: ?  * No resolved hospital problems. * ? ?Hospital Course: ?76 year old female with a history of diabetes mellitus type 2, hypertension, hyperlipidemia, anxiety, urinary retention presenting from  ?SNF with 2-day history of worsening shortness of breath.  The patient has resided at [facility] for about 9 months.  She is essentially wheelchair-bound.  However with activity she has noted increasing dyspnea on exertion.  She denies any fevers, chills, chest pain, syncope, dizziness, nausea, vomiting.  As result, the patient was brought to emergency department for further evaluation.  She was noted to have tachycardia with heart rate in the 140s. ?In the ED, the patient was afebrile and hemodynamically stable with oxygen saturation 98% on 2 L.  She was noted to have atrial fibrillation with RVR.  She was started on a diltiazem drip.  Chest x-ray showed pulmonary vascular congestion and bilateral pleural effusions.  Patient was started on IV heparin and IV diltiazem.  She also received furosemide 40 mg IV on 05/21/2021 and 05/22/2021.  Cardiology is following. ? ?Assessment and Plan: ?* Atrial fibrillation with RVR ([place]) ?Paroxysmal atrial fibrillation ?Initially on  diltiazem drip>>amiodarone drip with echo showed low EF ?HR remains in the 90s to low 100s in atrial fibrillation ?Cardiology following ?She was treated with IV amiodarone infusion, and subsequently transitioned to oral dosing ?She was also started on toprol XL ?We will hold off on TEE/cardioversion at this time since heart rate appears to be stabilizing ?She is anticoagulated with Eliquis ?TSH 2.946 ? ?Acute systolic CHF (congestive heart failure) ([place]) ?Initially Lasix 31m IV bid started ?Cardiology following ?3/14 Echo--EF 20-25%, global HK, reduced RV systolic function, mild-mod TR ?Daily weights ?Accurate I/Os ?Diuretics transiently held since creatinine trending up ?-Creatinine has since improved ?Restarted on oral lasix, volume status improved ?She is also on toprol XL ?Blood pressure precludes addition of ARB ? ?[place] ?Presented with dyspnea, tachypnea and hypoxia 86% on RA ?Currently oxygen on 2L ?Due to pulmonary edema/pleural effusions ?Lasix 470mIV bid started, but held due to rising creatinine ?Cardiology following ?She has been restarted on oral Lasix ?3/14 Echo--EF 20-25%, global HK, reduced RV systolic function, mild-mod TR ?Respiratory status has been stable ? ?UTI (urinary tract infection) ?3/13 UA >50 WBC ?-empiric ceftriaxone ?-urine culture with ESBL E coli and enterococcus ?-she was treated with 5 days of meropenem ?-blood cultures from 3/13 with no growth ? ?Lumbar stenosis with neurogenic claudication ?S/P L4, L5 laminectomies, L5 and S1 pedicle screw placement, L2-S1 posterolateral fusion, L4-5 TLIF on 08/18/2020 (Dr. OsZada Finders?Pt has been bed bound/WC bound for many months now ? ?Controlled type 2 diabetes mellitus without complication, without long-term [place] use of insulin [place]?Not on any agents in the outpatient setting ?A1c 5.9 ? ? ? ? ?  ? ? ?  Consultants: cardiology ?Procedures performed:   ?Disposition: Skilled nursing facility ?Diet  recommendation:  ?Discharge Diet Orders (From admission, onward)  ? ?  Start     Ordered  ? 05/29/21 0000  Diet - low sodium heart healthy       ? 05/29/21 5170  ? ?  ?  ? ?  ? ?Cardiac and Carb modified diet ?DISCHARGE MEDICATION: ?Allergies as of 05/29/2021   ? ?   Reactions  ? Demerol [meperidine Hcl]   ? Agitation  ? Morphine And Related   ? No reaction listed on nursing home record  ? ?  ? ?  ?Medication List  ?  ? ?STOP taking these medications   ? ?polyethylene glycol-electrolytes 420 g solution ?Commonly known as: TriLyte ?  ? ?  ? ?TAKE these medications   ? ?acetaminophen 325 MG tablet ?Commonly known as: TYLENOL ?Take 2 tablets (650 mg total) by mouth every 6 (six) hours as needed for mild pain (or Fever >/= 101). ?  ?amiodarone 400 MG tablet ?Commonly known as: PACERONE ?Take 425m po bid for 5 days then 2039mpo bid for 5 days then 20066maily ?  ?apixaban 5 MG Tabs tablet ?Commonly known as: ELIQUIS ?Take 1 tablet (5 mg total) by mouth 2 (two) times daily. ?  ?blood glucose meter kit and supplies ?Dispense based on patient and insurance preference. Use up to four times daily as directed. (FOR ICD-10 E10.9, E11.9). ?  ?diclofenac Sodium 1 % Gel ?Commonly known as: VOLTAREN ?Apply 2 g topically in the morning and at bedtime. ?  ?ferrous sulfate 325 (65 FE) MG tablet ?Take 325 mg by mouth daily with breakfast. ?  ?fluticasone 50 MCG/ACT nasal spray ?Commonly known as: FLONASE ?Place 1 spray into both nostrils 2 (two) times daily. ?  ?furosemide 40 MG tablet ?Commonly known as: LASIX ?Take 1 tablet (40 mg total) by mouth daily. ?  ?gabapentin 300 MG capsule ?Commonly known as: NEURONTIN ?Take 600 mg by mouth 3 (three) times daily. ?  ?HYDROcodone-acetaminophen 7.5-325 MG tablet ?Commonly known as: NORCO ?Take 1 tablet by mouth every 6 (six) hours as needed for moderate pain. ?  ?ipratropium-albuterol 0.5-2.5 (3) MG/3ML Soln ?Commonly known as: DUONEB ?Take 3 mLs by nebulization every 6 (six) hours as needed  (SOB). ?  ?metoprolol succinate 25 MG 24 hr tablet ?Commonly known as: TOPROL-XL ?Take 1 tablet (25 mg total) by mouth daily. ?Start taking on: May 30, 2021 ?  ?multivitamin with minerals Tabs tablet ?Take 1 tablet by mouth daily. ?  ?pantoprazole 40 MG tablet ?Commonly known as: Protonix ?Take 1 tablet (40 mg total) by mouth 2 (two) times daily. ?  ?polyethylene glycol 17 g packet ?Commonly known as: MIRALAX / GLYCOLAX ?Take 17 g by mouth daily. ?  ?Spiriva HandiHaler 18 MCG inhalation capsule ?Generic drug: tiotropium ?Place 1 capsule into inhaler and inhale daily. ?  ?spironolactone 25 MG tablet ?Commonly known as: ALDACTONE ?Take 0.5 tablets (12.5 mg total) by mouth daily. ?Start taking on: May 30, 2021 ?  ?tamsulosin 0.4 MG Caps capsule ?Commonly known as: FLOMAX ?Take 1 capsule (0.4 mg total) by mouth daily after supper. ?  ?venlafaxine XR 150 MG 24 hr capsule ?Commonly known as: EFFEXOR-XR ?Take 1 capsule (150 mg total) by mouth daily with breakfast. ?  ? ?  ? ? Follow-up Information   ? ? [facility] on 06/21/2021.   ?Specialties: Physician Assistant, Cardiology ?Why: Cardiology Hospital Follow-up on 06/21/2021 at  3:30 PM. ?Contact information: ?[address] ?[place] [address] ?[phone] ? ? ?  ?  ? ?  ?  ? ?  ? ?Discharge Exam: ?Filed Weights  ? 05/27/21 0500 05/28/21 0425 05/29/21 0500  ?Weight: 84.9 kg 85.9 kg 83.4 kg  ? ?General exam: Alert, awake, oriented x 3 ?Respiratory system: Clear to auscultation. Respiratory effort normal. ?Cardiovascular system:irregular. No murmurs, rubs, gallops. ?Gastrointestinal system: Abdomen is nondistended, soft and nontender. No organomegaly or masses felt. Normal bowel sounds heard. ?Central nervous system: Alert and oriented. No focal neurological deficits. ?Extremities: No C/C/E, +pedal pulses ?Skin: No rashes, lesions or ulcers ?Psychiatry: Judgement and insight appear normal. Mood & affect appropriate.  ? ? ?Condition at discharge:  good ? ?The results of significant diagnostics from this hospitalization (including imaging, microbiology, ancillary and laboratory) are listed below for reference.  ? ?Imaging Studies: ?DG [facility] [address]

## 2021-05-29 NOTE — Progress Notes (Signed)
? ?Progress Note ? ?Patient Name: Krystal Ali ?Date of Encounter: 05/29/2021 ? ?Primary Cardiologist: Freada Bergeron, MD ? ?Subjective  ? ?Slept better last night.  More interactive this morning.  No shortness of breath or palpitations.  Ate breakfast this morning. ? ?Inpatient Medications  ?  ?Scheduled Meds: ? amiodarone  400 mg Oral BID  ? apixaban  5 mg Oral BID  ? Chlorhexidine Gluconate Cloth  6 each Topical Daily  ? diclofenac Sodium  2 g Topical QID  ? fluticasone  1 spray Each Nare Daily  ? furosemide  40 mg Oral Daily  ? gabapentin  600 mg Oral TID  ? hydrocortisone cream   Topical TID  ? mouth rinse  15 mL Mouth Rinse BID  ? melatonin  3 mg Oral QHS  ? metoprolol succinate  25 mg Oral Daily  ? polyethylene glycol  17 g Oral Daily  ? spironolactone  12.5 mg Oral Daily  ? tamsulosin  0.4 mg Oral QPC supper  ? umeclidinium bromide  1 puff Inhalation Daily  ? venlafaxine XR  150 mg Oral Q breakfast  ? ?Continuous Infusions: ? sodium chloride Stopped (05/27/21 0521)  ? meropenem (MERREM) IV 1 g (05/29/21 0749)  ? ?PRN Meds: ?sodium chloride, acetaminophen **OR** acetaminophen, bisacodyl, HYDROcodone-acetaminophen, ipratropium-albuterol, ondansetron (ZOFRAN) IV  ? ?Vital Signs  ?  ?Vitals:  ? 05/29/21 0500 05/29/21 0600 05/29/21 0700 05/29/21 0743  ?BP: 96/60 126/63 108/67   ?Pulse: 91 93 83   ?Resp: '20 18 20   '$ ?Temp:    97.6 ?F (36.4 ?C)  ?TempSrc:    Oral  ?SpO2: 98% 96% 100%   ?Weight: 83.4 kg     ?Height:      ? ? ?Intake/Output Summary (Last 24 hours) at 05/29/2021 0829 ?Last data filed at 05/29/2021 0600 ?Gross per 24 hour  ?Intake 477.54 ml  ?Output 2250 ml  ?Net -1772.46 ml  ? ?Filed Weights  ? 05/27/21 0500 05/28/21 0425 05/29/21 0500  ?Weight: 84.9 kg 85.9 kg 83.4 kg  ? ? ?Telemetry  ?  ?Atrial fibrillation with heart rate in the 80s to 90s.  Personally reviewed. ? ?ECG  ?  ?I personally reviewed her ECG from 05/29/2021 which shows rate controlled atrial fibrillation with poor R wave  progression. ? ?Physical Exam  ? ?GEN: No acute distress.   ?Neck: No JVD. ?Cardiac: Irregularly irregular without murmur or gallop. ?Respiratory: Nonlabored. Clear to auscultation bilaterally. ?GI: Soft, nontender, bowel sounds present. ?MS: No edema; No deformity. ?Neuro:  Nonfocal. ?Psych: Alert and oriented x 3. Normal affect. ? ?Labs  ?  ?Chemistry ?Recent Labs  ?Lab 05/27/21 ?0428 05/28/21 ?0342 05/29/21 ?0416  ?NA 134* 132* 137  ?K 4.5 4.5 4.5  ?CL 99 97* 99  ?CO2 '27 26 30  '$ ?GLUCOSE 112* 117* 87  ?BUN 39* 41* 43*  ?CREATININE 0.94 1.01* 0.98  ?CALCIUM 8.8* 8.3* 8.5*  ?GFRNONAA >60 58* >60  ?ANIONGAP '8 9 8  '$ ?  ? ?Hematology ?Recent Labs  ?Lab 05/26/21 ?0416 05/27/21 ?0428 05/28/21 ?0342  ?WBC 5.6 5.8 5.1  ?RBC 3.68* 3.63* 3.69*  ?HGB 11.5* 11.8* 11.6*  ?HCT 37.6 37.3 37.4  ?MCV 102.2* 102.8* 101.4*  ?MCH 31.3 32.5 31.4  ?MCHC 30.6 31.6 31.0  ?RDW 12.3 12.3 12.2  ?PLT 196 187 181  ? ? ?Cardiac Enzymes ?Recent Labs  ?Lab 05/21/21 ?1315 05/21/21 ?1501  ?TROPONINIHS 17 15  ? ? ?Radiology  ?  ?No results found. ? ?Cardiac Studies  ? ?  Echocardiogram 05/22/2021: ? 1. Left ventricular ejection fraction, by estimation, is 20 to 25%. The  ?left ventricle has severely decreased function. The left ventricle  ?demonstrates global hypokinesis. Diastolic function indeterminant due to  ?Afib. Elevated left atrial pressure.  ? 2. Right ventricular systolic function mildly-to-moderately reduced. The  ?right ventricular size is normal. There is moderately elevated pulmonary  ?artery systolic pressure. The estimated right ventricular systolic  ?pressure is 28.4 mmHg.  ? 3. Left atrial size was moderately dilated.  ? 4. Right atrial size was moderately dilated.  ? 5. The mitral valve is abnormal. Mild mitral valve regurgitation.  ? 6. Tricuspid valve regurgitation is mild to moderate.  ? 7. The aortic valve is tricuspid. There is mild calcification of the  ?aortic valve. There is mild thickening of the aortic valve. Aortic valve   ?regurgitation is not visualized. Aortic valve sclerosis/calcification is  ?present, without any evidence of  ?aortic stenosis.  ? 8. The inferior vena cava is dilated in size with <50% respiratory  ?variability, suggesting right atrial pressure of 15 mmHg.  ? ?Assessment & Plan  ?  ?1.  Persistent atrial fibrillation presenting with RVR in the setting of UTI.  She has newly diagnosed cardiomyopathy, CHA2DS2-VASc score is 7.  Heart rate is much better controlled on oral therapy.  Currently on amiodarone, Toprol-XL, and Eliquis. ? ?2. HFrEF with newly diagnosed cardiomyopathy, LVEF 20 to 25% range with mildly to moderately reduced RV contraction as well.  Possibly tachycardia induced.  High-sensitivity troponin I levels are normal arguing against ACS.  In addition to above medication she is also on Aldactone and Lasix. ? ?3.  UTI with cultures growing E. coli and Enterococcus, currently on ceftriaxone. ? ?4.  Lumbar stenosis with neurogenic claudication and prior lumbar surgery, patient largely bedbound and wheelchair-bound at this point. ? ?At this point would continue heart rate control strategy with possible consideration of elective cardioversion later if she does not convert in the meanwhile on oral amiodarone.  Would not need TEE in the next 3 to 4 weeks.  Continue amiodarone, Toprol-XL, Eliquis, Aldactone, and Lasix.  Renal function is stable.  Current blood pressure range precludes addition of ARB or ARNI.  Would hold off on SGLT2 inhibitor due to recent UTI and largely bedbound status with higher risk of infection. ? ?Signed, ?[name], MD  ?05/29/2021, 8:29 AM    ?

## 2021-05-31 ENCOUNTER — Other Ambulatory Visit: Payer: Self-pay

## 2021-05-31 ENCOUNTER — Encounter: Payer: Medicare (Managed Care) | Admitting: Urology

## 2021-05-31 NOTE — Progress Notes (Deleted)
Assessment: ?1. Gross hematuria   ?2. Frequent UTI   ? ? ?Plan: ?Continue Flomax ?Recommend repeat evaluation with CT hematuria protocol given abnormalities noted on prior study from 7/22. ? ? ?Chief Complaint:  ?Chief Complaint  ?Patient presents with  ? Hematuria  ? ? ?History of Present Illness: ? ?Krystal Ali is a 76 y.o. year old female who is seen for further evaluation of recurrent UTIs and history of gross hematuria.  She was initially seen for the same in September 2022.   ?History:  ?She presented to the emergency room on 09/08/2020 with gross hematuria.  Urinalysis demonstrated >50 RBCs, >50 WBCs, and many bacteria.  Urine culture grew >100 K. Proteus and >100 K E. coli.  She presented to the emergency room again on 09/21/2020 with rectal bleeding and gross hematuria as well as difficulty urinating.  She reported gross painless hematuria for several weeks.  She was also having associated urgency, frequency, and dysuria.  A Foley catheter was placed at that time.  CT imaging showed a thickened bladder wall and thickened right renal pelvis consistent with infection or possible malignancy.  She also had changes noted to the uterus and cervix.  She was discharged from the hospital with plans for urologic follow-up.  She did not follow-up until September 2022.  At that time, she continued with a Foley catheter.  She denied any problems voiding prior to her hospitalization.   ?She was taking tamsulosin 0.4 mg daily and oxybutynin ER 5 mg daily.  She was treated with Macrobid x7 days from 11/10/2020 to 11/17/2020. ?Her Foley catheter was removed and the oxybutynin was discontinued.  She was scheduled for further evaluation with cystoscopy but has not returned. ?She also needed GYN follow-up for the abnormalities noted on vaginal ultrasound. ? ?Urinalysis from 04/14/2021 showed 20-30 WBCs and 3+ bacteria.  Urine culture grew >100 K mixed flora. ?Urinalysis from 04/25/2021 showed 11-20 RBCs, 51-100 WBCs, and 3+  bacteria.  Urine culture grew >100 K ESBL E. coli. ?She has had symptoms of dysuria, increased frequency and nausea associated with UTIs.  She has not had any further gross hematuria.  She was treated with IV ertapenem.  She began the IV antibiotics on 04/30/2021 and noted some improvement in her urinary symptoms with the antibiotic therapy. ?Urine culture from 05/21/2021 grew >100 K ESBL gram-negative rods and 50 K Enterococcus. ?She was subsequently admitted to the hospital.  Urine culture on admission grew greater than 100 K E. coli and VRE.  She was treated with IV meropenem. ? ? ? ?Portions of the above documentation were copied from a prior visit for review purposes only. ? ?Past Medical History:  ?Diagnosis Date  ? Anxiety   ? Diabetes mellitus without complication ([place])   ? GERD (gastroesophageal reflux disease)   ? Hyperlipidemia   ? Hypertension   ? Retention of urine, unspecified   ? 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 ? ?Past Surgical History:  ?Procedure Laterality Date  ? APPENDECTOMY    ? BACK SURGERY  08/2020  ? BIOPSY  09/20/2020  ? Procedure: BIOPSY;  Surgeon: Harvel Quale, MD;  [facility];  Service: Gastroenterology;;  ? BREAST SURGERY    ? CHOLECYSTECTOMY    ? COLONOSCOPY WITH PROPOFOL N/A 09/20/2020  ? Procedure: COLONOSCOPY WITH PROPOFOL;  Surgeon: Harvel Quale, MD;  [facility];  Service: Gastroenterology;  Laterality: N/A;  ? ESOPHAGOGASTRODUODENOSCOPY (EGD) WITH PROPOFOL N/A 09/20/2020  ?  Procedure: ESOPHAGOGASTRODUODENOSCOPY (EGD) WITH PROPOFOL;  Surgeon: Harvel Quale, MD;  [facility];  Service: Gastroenterology;  Laterality: N/A;  ? POLYPECTOMY  09/20/2020  ? Procedure: POLYPECTOMY;  Surgeon: Harvel Quale, MD;  [facility];  Service: Gastroenterology;;  ? ? ? ?Allergies  ?Allergen Reactions  ? Demerol [Meperidine Hcl]   ?  Agitation  ? Morphine And Related   ?   No reaction listed on nursing home record  ? ? ? ?Family History  ?Problem Relation Age of Onset  ? Heart disease Father   ? COPD Brother   ? ? ?Social History  ? ?Tobacco Use  ? Smoking status: Never  ? Smokeless tobacco: Never  ?Vaping Use  ? Vaping Use: Never used  ?Substance Use Topics  ? Alcohol use: Not Currently  ? Drug use: Never  ? ? ?ROS: ?Constitutional:  Negative for fever, chills, weight loss ?CV: Negative for chest pain, previous MI, hypertension ?Respiratory:  Negative for shortness of breath, wheezing, sleep apnea, frequent cough ?GI:  Negative for nausea, vomiting, bloody stool, GERD ? ?Physical exam: ?There were no vitals taken for this visit. ?GENERAL APPEARANCE:  Well appearing, well developed, well nourished, NAD ?HEENT:  Atraumatic, normocephalic, oropharynx clear ?NECK:  Supple without lymphadenopathy or thyromegaly ?ABDOMEN:  Soft, non-tender, no masses ?EXTREMITIES:  Moves all extremities well, without clubbing, cyanosis, or edema ?NEUROLOGIC:  Alert and oriented x 3, normal gait, CN II-XII grossly intact ?MENTAL STATUS:  appropriate ?BACK:  Non-tender to palpation, No CVAT ?SKIN:  Warm, dry, and intact ? ? ?Results: ? ? ?Procedure:  Flexible Cystourethroscopy ? ?Pre-operative Diagnosis: {cysto diagnosis:26394} ? ?Post-operative Diagnosis: {cysto diagnosis:26394} ? ?Anesthesia:  local with lidocaine jelly ? ?Surgical Narrative: ? ?After appropriate informed consent was obtained, the patient was prepped and draped in the usual sterile fashion in the supine position.  The patient was correctly identified and the proper procedure delineated prior to proceeding.  Sterile lidocaine gel was instilled in the urethra. ?The flexible cystoscope was introduced without difficulty. ? ?Findings: ? ?Urethra: {anterior urethral findings:26395} ? ?Bladder: {bladder findings:26397} ? ?Ureteral orifices: {Normal/Abnormal Appearance:21344::"normal"} ? ?Additional findings: ? ?Saline bladder wash for cytology  {WAS/WAS NOT:[phone]::"was not"} performed.   ? ?The cystoscope was then removed.  The patient tolerated the procedure well. ? ?This encounter was created in error - please disregard. ?

## 2021-06-04 DIAGNOSIS — Z20822 Contact with and (suspected) exposure to covid-19: Secondary | ICD-10-CM | POA: Diagnosis not present

## 2021-06-07 ENCOUNTER — Ambulatory Visit (HOSPITAL_COMMUNITY)
Admission: RE | Admit: 2021-06-07 | Discharge: 2021-06-07 | Disposition: A | Discharge status: Home / Self Care | Payer: Medicare (Managed Care) | Source: Ambulatory Visit | Attending: Urology | Admitting: Urology

## 2021-06-07 DIAGNOSIS — N390 Urinary tract infection, site not specified: Secondary | ICD-10-CM | POA: Insufficient documentation

## 2021-06-07 DIAGNOSIS — R310 Gross hematuria: Secondary | ICD-10-CM | POA: Diagnosis not present

## 2021-06-07 MED ORDER — IOHEXOL 300 MG/ML  SOLN
100.0000 mL | Freq: Once | INTRAMUSCULAR | Status: AC | PRN
  Administered 2021-06-07: 100 mL via INTRAVENOUS

## 2021-06-11 ENCOUNTER — Encounter: Payer: Self-pay | Admitting: Urology

## 2021-06-11 ENCOUNTER — Telehealth: Payer: Self-pay | Admitting: Cardiology

## 2021-06-11 ENCOUNTER — Ambulatory Visit (INDEPENDENT_AMBULATORY_CARE_PROVIDER_SITE_OTHER): Payer: Medicare (Managed Care) | Admitting: Urology

## 2021-06-11 VITALS — BP 120/79 | HR 68 | Ht 65.0 in | Wt 183.8 lb

## 2021-06-11 DIAGNOSIS — N390 Urinary tract infection, site not specified: Secondary | ICD-10-CM | POA: Diagnosis not present

## 2021-06-11 DIAGNOSIS — R310 Gross hematuria: Secondary | ICD-10-CM

## 2021-06-11 DIAGNOSIS — R8290 Unspecified abnormal findings in urine: Secondary | ICD-10-CM

## 2021-06-11 DIAGNOSIS — R339 Retention of urine, unspecified: Secondary | ICD-10-CM

## 2021-06-11 DIAGNOSIS — N200 Calculus of kidney: Secondary | ICD-10-CM

## 2021-06-11 LAB — URINALYSIS, ROUTINE W REFLEX MICROSCOPIC
Bilirubin, UA: NEGATIVE
Glucose, UA: NEGATIVE
Ketones, UA: NEGATIVE
Nitrite, UA: POSITIVE — AB
Protein,UA: NEGATIVE
Specific Gravity, UA: 1.015 (ref 1.005–1.030)
Urobilinogen, Ur: 0.2 mg/dL (ref 0.2–1.0)
pH, UA: 7 (ref 5.0–7.5)

## 2021-06-11 LAB — MICROSCOPIC EXAMINATION: WBC, UA: >30 /hpf — AB (ref 0–5)

## 2021-06-11 MED ORDER — CIPROFLOXACIN HCL 500 MG PO TABS
500.0000 mg | ORAL_TABLET | Freq: Once | ORAL | Status: AC
  Administered 2021-06-11: 500 mg via ORAL

## 2021-06-11 NOTE — Telephone Encounter (Signed)
NP is calling from the patient's living facility stating they did a chest xray on the patient and the results were worse than the last. It showed moderate pulmonary edema and/or pneumonia. She reports she is going to give the patient an extra dose of Lasix and wants to know if the office has any further recommendations.   ?

## 2021-06-11 NOTE — Telephone Encounter (Signed)
Will forward to Dr. Johney Frame.  ?

## 2021-06-11 NOTE — Addendum Note (Signed)
Addended byIris Pert on: 06/11/2021 04:19 PM ? ? Modules accepted: Orders ? ?

## 2021-06-11 NOTE — Telephone Encounter (Signed)
Spoke with Malena Catholic NP who states that she had labs done on 3/28 and she will have them faxed to the office. Malena Catholic NP agrees with plan of care.  ?

## 2021-06-11 NOTE — Addendum Note (Signed)
Addended by: Audie Box on: 06/11/2021 03:33 PM ? ? Modules accepted: Orders ? ?

## 2021-06-11 NOTE — Progress Notes (Signed)
Assessment: ?1. Gross hematuria   ?2. Frequent UTI   ?3. Incomplete bladder emptying   ?4. Nephrolithiasis   ?5. Abnormal urine findings   ? ? ?Plan: ?I personally reviewed the CT study from 06/07/2021 with results as noted below. ?Urine cytology sent today. ?Urine culture sent today.  We will contact with results. ?Continue Flomax 0.4 mg daily. ?May need to consider beginning in and out catheterization twice daily-3 times daily to keep bladder empty and decrease risk of recurrent UTIs. ?Recommend GYN evaluation for thickened endometrium seen on recent CT scan as well as CT from 7/22. ?We will contact with results and to arrange next visit. ? ?Chief Complaint:  ?Chief Complaint  ?Patient presents with  ? Hematuria  ? ? ?History of Present Illness: ? ?Krystal Ali is a 76 y.o. year old female who is seen for further evaluation of recurrent UTIs and history of gross hematuria.  She was initially seen for the same in September 2022.   ? ?History:  ?She presented to the emergency room on 09/08/2020 with gross hematuria.  Urinalysis demonstrated >50 RBCs, >50 WBCs, and many bacteria.  Urine culture grew >100 K. Proteus and >100 K E. coli.  She presented to the emergency room again on 09/21/2020 with rectal bleeding and gross hematuria as well as difficulty urinating.  She reported gross painless hematuria for several weeks.  She was also having associated urgency, frequency, and dysuria.  A Foley catheter was placed at that time.  CT imaging showed a thickened bladder wall and thickened right renal pelvis consistent with infection or possible malignancy.  She also had changes noted to the uterus and cervix.  She was discharged from the hospital with plans for urologic follow-up.  She did not follow-up until September 2022.  At that time, she continued with a Foley catheter.  She denied any problems voiding prior to her hospitalization.   ?She was taking tamsulosin 0.4 mg daily and oxybutynin ER 5 mg daily.  She was  treated with Macrobid x7 days from 11/10/2020 to 11/17/2020. ?Her Foley catheter was removed and the oxybutynin was discontinued.  She was scheduled for further evaluation with cystoscopy but had not returned. ?She also needed GYN follow-up for the abnormalities noted on vaginal ultrasound. ? ?Urinalysis from 04/14/2021 showed 20-30 WBCs and 3+ bacteria.  Urine culture grew >100 K mixed flora. ?Urinalysis from 04/25/2021 showed 11-20 RBCs, 51-100 WBCs, and 3+ bacteria.  Urine culture grew >100 K ESBL E. coli. ?She has had symptoms of dysuria, increased frequency and nausea associated with UTIs.  She has not had any further gross hematuria.  She was recently on IV ertapenem for treatment of her most recent UTI.  She began the IV antibiotics on 04/30/2021.  She noted some improvement in her urinary symptoms with the antibiotic therapy. ?Urine culture from 05/21/2021 grew +100 K gram-negative rods, ESBL, and 50 K Enterococcus.  She was treated with IV meropenem. ? ?CT hematuria protocol from 06/07/2021 demonstrated improvement in the wall thickening of the right renal pelvis and bladder without focal urothelial lesions or obstruction, new 7 mm calcification within the right renal pelvis, persistent abnormal thickening of the endometrium, and stable right adrenal adenoma. ? ?She presents today for cystoscopy. ?No recent gross hematuria, dysuria, or flank pain. ? ?Portions of the above documentation were copied from a prior visit for review purposes only. ? ?Past Medical History:  ?Diagnosis Date  ? Anxiety   ? Diabetes mellitus without complication ([place])   ?  GERD (gastroesophageal reflux disease)   ? Hyperlipidemia   ? Hypertension   ? Retention of urine, unspecified   ? 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 ? ?Past Surgical History:  ?Procedure Laterality Date  ? APPENDECTOMY    ? BACK SURGERY  08/2020  ? BIOPSY  09/20/2020  ? Procedure: BIOPSY;  Surgeon: Harvel Quale, MD;  [facility];  Service: Gastroenterology;;  ? BREAST SURGERY    ? CHOLECYSTECTOMY    ? COLONOSCOPY WITH PROPOFOL N/A 09/20/2020  ? Procedure: COLONOSCOPY WITH PROPOFOL;  Surgeon: Harvel Quale, MD;  [facility];  Service: Gastroenterology;  Laterality: N/A;  ? ESOPHAGOGASTRODUODENOSCOPY (EGD) WITH PROPOFOL N/A 09/20/2020  ? Procedure: ESOPHAGOGASTRODUODENOSCOPY (EGD) WITH PROPOFOL;  Surgeon: Harvel Quale, MD;  [facility];  Service: Gastroenterology;  Laterality: N/A;  ? POLYPECTOMY  09/20/2020  ? Procedure: POLYPECTOMY;  Surgeon: Harvel Quale, MD;  [facility];  Service: Gastroenterology;;  ? ? ? ?Allergies  ?Allergen Reactions  ? Demerol [Meperidine Hcl]   ?  Agitation  ? Morphine And Related   ?  No reaction listed on nursing home record  ? ? ? ?Family History  ?Problem Relation Age of Onset  ? Heart disease Father   ? COPD Brother   ? ? ?Social History  ? ?Tobacco Use  ? Smoking status: Never  ? Smokeless tobacco: Never  ?Vaping Use  ? Vaping Use: Never used  ?Substance Use Topics  ? Alcohol use: Not Currently  ? Drug use: Never  ? ? ?ROS: ?Constitutional:  Negative for fever, chills, weight loss ?CV: Negative for chest pain, previous MI, hypertension ?Respiratory:  Negative for shortness of breath, wheezing, sleep apnea, frequent cough ?GI:  Negative for nausea, vomiting, bloody stool, GERD ? ?Physical exam: ?GENERAL APPEARANCE:  Well appearing, well developed, well nourished, NAD ?HEENT:  Atraumatic, normocephalic, oropharynx clear ?NECK:  Supple without lymphadenopathy or thyromegaly ?ABDOMEN:  Soft, non-tender, no masses ?EXTREMITIES:  Moves all extremities well, without clubbing, cyanosis, or edema ?NEUROLOGIC:  Alert and oriented x 3, normal gait, CN II-XII grossly intact ?MENTAL STATUS:  appropriate ?BACK:  Non-tender to palpation, No CVAT ?SKIN:  Warm, dry, and intact ? ? ?Results: ?U/A: >30 WBC, 3-10 RBC, few bacteria, nitrite + ? ?CT  ABDOMEN AND PELVIS WITHOUT AND WITH CONTRAST ?  ?TECHNIQUE: ?Multidetector CT imaging of the abdomen and pelvis was performed ?following the standard protocol before and following the bolus ?administration of intravenous contrast. ?  ?RADIATION DOSE REDUCTION: This exam was performed according to the ?departmental dose-optimization program which includes automated ?exposure control, adjustment of the mA and/or kV according to ?patient size and/or use of iterative reconstruction technique. ?  ?CONTRAST:  151m OMNIPAQUE IOHEXOL 300 MG/ML  SOLN ?  ?COMPARISON:  Abdominopelvic CT 09/21/2020. Chest radiographs ?05/21/2021. Pelvic ultrasound 09/22/2020. ?  ?FINDINGS: ?Lower chest: There are moderate-sized bilateral pleural effusions ?with associated left greater than right lower lobe opacities which ?are new from the previous CT and possibly progressive from the ?radiographs of 2 weeks ago. There is a stable 6 mm subpleural nodule ?in the right middle lobe on image 42/17. There is a moderate size ?hiatal hernia and atherosclerosis of the aorta and coronary ?arteries. ?  ?Hepatobiliary: The liver is normal in density without suspicious ?focal abnormality. No significant biliary dilatation status post ?cholecystectomy. ?  ?Pancreas: Unremarkable. No pancreatic ductal dilatation or ?surrounding inflammatory changes. ?  ?Spleen: Normal in size  without focal abnormality. ?  ?Adrenals/Urinary Tract: Right adrenal nodule measures 18 HU on the ?pre contrast images (nonspecific) although is unchanged in size from ?the previous study, measuring 2.4 cm on image 32/10. On the ?immediate postcontrast images, this has a density of 40 HU. On the ?delayed images, this has a density of 40 HU. These absolute and ?relative washout values are nonspecific, although this is likely an ?adenoma based on stability and low-density on the previous study. ?The left adrenal gland appears normal. Pre contrast images ?demonstrate a new calcification  in the right renal pelvis measuring ?7 mm on coronal image 70/7. No other urinary tract calculi are seen. ?Post-contrast, both kidneys enhance normally. There is no evidence ?of enhancing renal mass. The p

## 2021-06-11 NOTE — Telephone Encounter (Signed)
I would increase her lasix to '40mg'$  PO BID for 3 days and then back to '40mg'$  daily thereafter. We should probably get her in ASAP to check in on her to check BNP and BMET. Is she able to travel in for an appointment? ?

## 2021-06-17 LAB — URINE CULTURE

## 2021-06-19 ENCOUNTER — Telehealth: Payer: Self-pay

## 2021-06-19 DIAGNOSIS — R310 Gross hematuria: Secondary | ICD-10-CM

## 2021-06-19 DIAGNOSIS — N390 Urinary tract infection, site not specified: Secondary | ICD-10-CM

## 2021-06-19 MED ORDER — NITROFURANTOIN MONOHYD MACRO 100 MG PO CAPS: 100.0000 mg | ORAL_CAPSULE | Freq: Every evening | ORAL | 2 refills | Status: DC

## 2021-06-19 MED ORDER — NITROFURANTOIN MONOHYD MACRO 100 MG PO CAPS: 100.0000 mg | ORAL_CAPSULE | Freq: Two times a day (BID) | ORAL | 0 refills | Status: DC

## 2021-06-19 MED ORDER — CIPROFLOXACIN HCL 500 MG PO TABS: 500.0000 mg | ORAL_TABLET | Freq: Two times a day (BID) | ORAL | 0 refills | Status: DC

## 2021-06-19 MED ORDER — CIPROFLOXACIN HCL 500 MG PO TABS
500.0000 mg | ORAL_TABLET | Freq: Two times a day (BID) | ORAL | 0 refills | Status: DC
Start: 2021-06-19 — End: 2021-06-19

## 2021-06-19 NOTE — Telephone Encounter (Signed)
-----   Message from Primus Bravo, MD sent at 06/18/2021  9:06 AM EDT ----- ?Please call nursing home with following orders: ?- Macrobid 100 mg PO BID x 10 days then 100 mg PO daily ?- Cipro 500 PO BID x 5 days ?- Schedule follow-up appt in 6 weeks ?

## 2021-06-19 NOTE — Telephone Encounter (Signed)
Spoke with [place]- patient nurse at facility. ?Fax number [phone] ? ?Orders placed and appointment made. Will fax over medication orders and appointment.  ?

## 2021-06-19 NOTE — Addendum Note (Signed)
Addended byIris Pert on: 06/19/2021 12:39 PM ? ? Modules accepted: Orders ? ?

## 2021-06-21 ENCOUNTER — Telehealth: Payer: Self-pay | Admitting: *Deleted

## 2021-06-21 ENCOUNTER — Encounter: Payer: Self-pay | Admitting: Student

## 2021-06-21 ENCOUNTER — Ambulatory Visit (INDEPENDENT_AMBULATORY_CARE_PROVIDER_SITE_OTHER): Payer: Medicare (Managed Care) | Admitting: Student

## 2021-06-21 VITALS — BP 130/87 | HR 98 | Ht 65.0 in | Wt 176.0 lb

## 2021-06-21 DIAGNOSIS — M48062 Spinal stenosis, lumbar region with neurogenic claudication: Secondary | ICD-10-CM

## 2021-06-21 DIAGNOSIS — I4819 Other persistent atrial fibrillation: Secondary | ICD-10-CM | POA: Diagnosis not present

## 2021-06-21 DIAGNOSIS — I5020 Unspecified systolic (congestive) heart failure: Secondary | ICD-10-CM

## 2021-06-21 NOTE — Patient Instructions (Signed)
Medication Instructions:  ?Your physician recommends that you continue on your current medications as directed. Please refer to the Current Medication list given to you today. ? ?*If you need a refill on your cardiac medications before your next appointment, please call your pharmacy* ? ? ?Lab Work: ?NONE  ? ?If you have labs (blood work) drawn today and your tests are completely normal, you will receive your results only by: ?MyChart Message (if you have MyChart) OR ?A paper copy in the mail ?If you have any lab test that is abnormal or we need to change your treatment, we will call you to review the results. ? ? ?Testing/Procedures: ?NONE  ? ? ?Follow-Up: ?At [facility], you and your health needs are our priority.  As part of our continuing mission to provide you with exceptional heart care, we have created designated Provider Care Teams.  These Care Teams include your primary Cardiologist (physician) and Advanced Practice Providers (APPs -  Physician Assistants and Nurse Practitioners) who all work together to provide you with the care you need, when you need it. ? ?We recommend signing up for the patient portal called "MyChart".  Sign up information is provided on this After Visit Summary.  MyChart is used to connect with patients for Virtual Visits (Telemedicine).  Patients are able to view lab/test results, encounter notes, upcoming appointments, etc.  Non-urgent messages can be sent to your provider as well.   ?To learn more about what you can do with MyChart, go to [email or web address].   ? ?Your next appointment:   ? Next Friday  ? ?The format for your next appointment:   ?In Person ? ?Provider:   ?Bernerd Pho, PA-C  ? ? ?Other Instructions ?Thank you for choosing [facility]! ? ? ? ?Important Information About [employer] ? ? ? ? ? ? ?

## 2021-06-21 NOTE — Telephone Encounter (Signed)
?  Patient Consent for Virtual Visit  ? ? ?   ? ?[name] has provided verbal consent on 06/21/2021 for a virtual visit (video or telephone). ? ? ?CONSENT FOR VIRTUAL VISIT FOR:  Krystal Ali  ?By participating in this virtual visit I agree to the following: ? ?I hereby voluntarily request, consent and authorize [facility] and its employed or contracted physicians, physician assistants, nurse practitioners or other licensed health care professionals (the Practitioner), to provide me with telemedicine health care services (the ?Services") as deemed necessary by the treating Practitioner. I acknowledge and consent to receive the Services by the Practitioner via telemedicine. I understand that the telemedicine visit will involve communicating with the Practitioner through live audiovisual communication technology and the disclosure of certain medical information by electronic transmission. I acknowledge that I have been given the opportunity to request an in-person assessment or other available alternative prior to the telemedicine visit and am voluntarily participating in the telemedicine visit. ? ?I understand that I have the right to withhold or withdraw my consent to the use of telemedicine in the course of my care at any time, without affecting my right to future care or treatment, and that the Practitioner or I may terminate the telemedicine visit at any time. I understand that I have the right to inspect all information obtained and/or recorded in the course of the telemedicine visit and may receive copies of available information for a reasonable fee.  I understand that some of the potential risks of receiving the Services via telemedicine include:  ?Delay or interruption in medical evaluation due to technological equipment failure or disruption; ?Information transmitted may not be sufficient (e.g. poor resolution of images) to allow for appropriate medical decision making by the Practitioner;  and/or  ?In rare instances, security protocols could fail, causing a breach of personal health information. ? ?Furthermore, I acknowledge that it is my responsibility to provide information about my medical history, conditions and care that is complete and accurate to the best of my ability. I acknowledge that Practitioner's advice, recommendations, and/or decision may be based on factors not within their control, such as incomplete or inaccurate data provided by me or distortions of diagnostic images or specimens that may result from electronic transmissions. I understand that the practice of medicine is not an exact science and that Practitioner makes no warranties or guarantees regarding treatment outcomes. I acknowledge that a copy of this consent can be made available to me via my patient portal ([facility]), or I can request a printed copy by calling the office of [facility].   ? ?I understand that my insurance will be billed for this visit.  ? ?I have read or had this consent read to me. ?I understand the contents of this consent, which adequately explains the benefits and risks of the Services being provided via telemedicine.  ?I have been provided ample opportunity to ask questions regarding this consent and the Services and have had my questions answered to my satisfaction. ?I give my informed consent for the services to be provided through the use of telemedicine in my medical care ? ?  ?

## 2021-06-21 NOTE — Progress Notes (Signed)
? ?Virtual Visit via Telephone Note  ? ?This visit type was conducted due to national recommendations for restrictions regarding the COVID-19 Pandemic (e.g. social distancing) in an effort to limit this patient's exposure and mitigate transmission in our community.  Due to her co-morbid illnesses, this patient is at least at moderate risk for complications without adequate follow up.  This format is felt to be most appropriate for this patient at this time.  The patient did not have access to video technology/had technical difficulties with video requiring transitioning to audio format only (telephone).  All issues noted in this document were discussed and addressed.  No physical exam could be performed with this format.  Please refer to the patient's chart for her  consent to telehealth for [facility].  ? ? ?Date:  06/21/2021  ? ?ID:  Krystal Ali, DOB [birth year], MRN [id number] ?The patient was identified using 2 identifiers. ? ?Patient Location: [place] ?Provider Location: Home Office ? ? ?PCP:  Hal Morales, DO ?  ?[facility] ?Cardiologist:  Freada Bergeron, MD    ? ?Evaluation Performed:  Follow-Up Visit ? ?Chief Complaint: Hospital Follow-up ? ?History of Present Illness:   ? ?Krystal Ali is a 76 y.o. female with past medical history of HTN, HLD, Type 2 DM, newly diagnosed atrial fibrillation with RVR and new cardiomyopathy who presents for a hospital follow-up telehealth visit.  ? ?She initially presented to [facility] ED on [facility]/21/[facility] from SNF for evaluation of worsening dyspnea and was found to be in atrial fibrillation with RVR upon arrival.  She was started on IV Cardizem but follow-up echocardiogram showed that her EF was reduced at 20 to 25%, therefore Cardizem was discontinued and switched to IV Amiodarone. 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 She was started on Toprol-XL and  Spironolactone for her cardiomyopathy but BP did not allow for the use of an ACE-I/ARB/ARNI and she was not started on an SGLT2 inhibitor given her recent UTI and bed-bound status.  ? ?In talking with the patient today and one of her caregivers, she reports she was overall doing well until until last night when she had an episode of right-sided chest pain. Reports that she feels tender along her clavicular region but her pain was only significant for 1 to 2 minutes and spontaneously resolved. Says her face felt funny at that time as well. No blurred vision or slurred speech. She did not have to take nitroglycerin. She denies any specific palpitations but she has she was overall unaware of her arrhythmia during admission. Her breathing has been stable with no specific orthopnea or PND.  No reported abdominal distention or lower extremity edema. She has been tolerating her medications well and denies any reported side effects. ? ?The patient does not have symptoms concerning for COVID-19 infection (fever, chills, cough, or new shortness of breath).  ? ? ?Past Medical History:  ?Diagnosis Date  ? Anxiety   ? Diabetes mellitus without complication ([place])   ? GERD (gastroesophageal reflux disease)   ? Hyperlipidemia   ? Hypertension   ? Retention of urine, unspecified   ? 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 ?Past Surgical History:  ?Procedure Laterality Date  ? APPENDECTOMY    ? BACK SURGERY  08/2020  ? BIOPSY  09/20/2020  ? Procedure: BIOPSY;  Surgeon: Krystal Quale, MD;  [facility];  Service: Gastroenterology;;  ?  BREAST SURGERY    ? CHOLECYSTECTOMY    ? COLONOSCOPY WITH PROPOFOL N/A 09/20/2020  ? Procedure: COLONOSCOPY WITH PROPOFOL;  Surgeon: Krystal Quale, MD;  [facility];  Service: Gastroenterology;  Laterality: N/A;  ? ESOPHAGOGASTRODUODENOSCOPY (EGD) WITH PROPOFOL N/A 09/20/2020  ? Procedure: ESOPHAGOGASTRODUODENOSCOPY (EGD) WITH  PROPOFOL;  Surgeon: Krystal Quale, MD;  [facility];  Service: Gastroenterology;  Laterality: N/A;  ? POLYPECTOMY  09/20/2020  ? Procedure: POLYPECTOMY;  Surgeon: Krystal Quale, MD;  [facility];  Service: Gastroenterology;;  ?  ? ?Current Meds  ?Medication Sig  ? acetaminophen (TYLENOL) 325 MG tablet Take 2 tablets (650 mg total) by mouth every 6 (six) hours as needed for mild pain (or Fever >/= 101).  ? amiodarone (PACERONE) 200 MG tablet Take 200 mg by mouth daily.  ? amoxicillin-clavulanate (AUGMENTIN) 875-125 MG tablet Take 1 tablet by mouth 2 (two) times daily.  ? apixaban (ELIQUIS) 5 MG TABS tablet Take 1 tablet (5 mg total) by mouth 2 (two) times daily.  ? ciprofloxacin (CIPRO) 500 MG tablet Take 1 tablet (500 mg total) by mouth every 12 (twelve) hours.  ? diclofenac Sodium (VOLTAREN) 1 % GEL Apply 2 g topically in the morning and at bedtime.  ? ferrous sulfate 325 (65 FE) MG tablet Take 325 mg by mouth daily with breakfast.  ? fluticasone (FLONASE) 50 MCG/ACT nasal spray Place 1 spray into both nostrils 2 (two) times daily.  ? furosemide (LASIX) 40 MG tablet Take 1 tablet (40 mg total) by mouth daily.  ? gabapentin (NEURONTIN) 300 MG capsule Take 600 mg by mouth 3 (three) times daily.  ? HYDROcodone-acetaminophen (NORCO) 7.5-325 MG tablet Take 1 tablet by mouth every 6 (six) hours as needed for moderate pain.  ? INCRUSE ELLIPTA 62.5 MCG/ACT AEPB Inhale 1 puff into the lungs daily.  ? ipratropium-albuterol (DUONEB) 0.5-2.5 (3) MG/3ML SOLN Take 3 mLs by nebulization every 6 (six) hours as needed (SOB).  ? metoprolol succinate (TOPROL-XL) 25 MG 24 hr tablet Take 1 tablet (25 mg total) by mouth daily.  ? Multiple Vitamin (MULTIVITAMIN WITH MINERALS) TABS tablet Take 1 tablet by mouth daily.  ? pantoprazole (PROTONIX) 40 MG tablet Take 1 tablet (40 mg total) by mouth 2 (two) times daily. (Patient taking differently: Take 40 mg by mouth daily.)  ? polyethylene glycol  (MIRALAX / GLYCOLAX) 17 g packet Take 17 g by mouth daily.  ? spironolactone (ALDACTONE) 25 MG tablet Take 0.5 tablets (12.5 mg total) by mouth daily.  ? tamsulosin (FLOMAX) 0.4 MG CAPS capsule Take 1 capsule (0.4 mg total) by mouth daily after supper.  ? venlafaxine XR (EFFEXOR-XR) 150 MG 24 hr capsule Take 1 capsule (150 mg total) by mouth daily with breakfast.  ?  ? ?Allergies:   Demerol [meperidine hcl] and Morphine and related  ? ?Social History  ? ?Tobacco Use  ? Smoking status: Never  ? Smokeless tobacco: Never  ?Vaping Use  ? Vaping Use: Never used  ?Substance Use Topics  ? Alcohol use: Not Currently  ? Drug use: Never  ?  ? ?Family Hx: ?The patient's family history includes COPD in her brother; Heart disease in her father. ? ?ROS:   ?Please see the history of present illness.    ? ?All other systems reviewed and are negative. ? ? ?Prior CV studies:   ?The following studies were reviewed today: ? ?Echocardiogram: 05/2021 ?IMPRESSIONS  ? ? ? 1. Left ventricular ejection fraction, by  estimation, is 20 to 25%. The  ?left ventricle has severely decreased function. The left ventricle  ?demonstrates global hypokinesis. Diastolic function indeterminant due to  ?Afib. Elevated left atrial pressure.  ? 2. Right ventricular systolic function mildly-to-moderately reduced. The  ?right ventricular size is normal. There is moderately elevated pulmonary  ?artery systolic pressure. The estimated right ventricular systolic  ?pressure is 55.7 mmHg.  ? 3. Left atrial size was moderately dilated.  ? 4. Right atrial size was moderately dilated.  ? 5. The mitral valve is abnormal. Mild mitral valve regurgitation.  ? 6. Tricuspid valve regurgitation is mild to moderate.  ? 7. The aortic valve is tricuspid. There is mild calcification of the  ?aortic valve. There is mild thickening of the aortic valve. Aortic valve  ?regurgitation is not visualized. Aortic valve sclerosis/calcification is  ?present, without any evidence of  ?aortic  stenosis.  ? 8. The inferior vena cava is dilated in size with <50% respiratory  ?variability, suggesting right atrial pressure of 15 mmHg.  ? ?Comparison(s): No prior Echocardiogram.  ? ?Labs/Other Tests and D

## 2021-06-27 ENCOUNTER — Other Ambulatory Visit: Payer: Self-pay | Admitting: Urology

## 2021-06-28 NOTE — Progress Notes (Signed)
? ?Cardiology Office Note   ? ?Date:  06/30/2021  ? ?ID:  Krystal Ali, DOB 1945/11/27, MRN [id number] ? ?PCP:  Hal Morales, DO  ?Cardiologist: Freada Bergeron, MD   ? ?Chief Complaint  ?Patient presents with  ? Follow-up  ?  1 week visit  ? ? ?History of Present Illness:   ? ?Krystal Ali is a 76 y.o. female with past medical history of HTN, HLD, Type 2 DM, newly diagnosed atrial fibrillation with RVR and new cardiomyopathy who presents for a 1-week follow-up visit.  ? ?She wad admitted to [facility] in 05/2021 for evaluation of dyspnea and was found to be in atrial fibrillation with RVR and to have a new cardiomyopathy with EF of 20 to 25%.  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 She did have a telehealth visit with myself on 06/21/2021 and reported occasional episodes of chest discomfort but denied any specific palpitations. It had previously been recommended to consider DCCV following 3 weeks of anticoagulation, therefore an in office visit was arranged and if still in atrial fibrillation, would schedule a DCCV. She was continued on Eliquis 5 mg twice daily, Amiodarone 200 mg daily and Toprol-XL 25 mg daily. ? ?In talking with the patient today and one of her caregivers from SNF, she reports that her breathing has been stable since her hospitalization.  She does use 2 L nasal cannula at baseline. No recent orthopnea or PND and her lower extremity edema has improved. She denies any specific chest pain or palpitations. Does report feeling "jittery" at times. She remains on Eliquis for anticoagulation with no reported melena, hematochezia or hematuria. She does experience intermittent epistaxis and we reviewed the possible use of saline nasal spray to help with this. ? ?Past Medical  History:  ?Diagnosis Date  ? Anxiety   ? Diabetes mellitus without complication ([place])   ? GERD (gastroesophageal reflux disease)   ? Hyperlipidemia   ? Hypertension   ? Retention of urine, unspecified   ? 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 ? ?Past Surgical History:  ?Procedure Laterality Date  ? APPENDECTOMY    ? BACK SURGERY  08/2020  ? BIOPSY  09/20/2020  ? Procedure: BIOPSY;  Surgeon: Harvel Quale, MD;  [facility];  Service: Gastroenterology;;  ? BREAST SURGERY    ? CHOLECYSTECTOMY    ? COLONOSCOPY WITH PROPOFOL N/A 09/20/2020  ? Procedure: COLONOSCOPY WITH PROPOFOL;  Surgeon: Harvel Quale, MD;  [facility];  Service: Gastroenterology;  Laterality: N/A;  ? ESOPHAGOGASTRODUODENOSCOPY (EGD) WITH PROPOFOL N/A 09/20/2020  ? Procedure: ESOPHAGOGASTRODUODENOSCOPY (EGD) WITH PROPOFOL;  Surgeon: Harvel Quale, MD;  [facility];  Service: Gastroenterology;  Laterality: N/A;  ? POLYPECTOMY  09/20/2020  ? Procedure: POLYPECTOMY;  Surgeon: Harvel Quale, MD;  [facility];  Service: Gastroenterology;;  ? ? ?Current Medications: ?Outpatient Medications Prior to Visit  ?Medication Sig Dispense Refill  ? acetaminophen (TYLENOL) 325 MG tablet Take 2 tablets (650 mg total) by mouth every 6 (six) hours as needed for mild pain (or Fever >/= 101). 12 tablet 0  ? amiodarone (PACERONE) 200 MG tablet Take 200 mg by mouth daily.    ? blood glucose meter kit and supplies Dispense  based on patient and insurance preference. Use up to four times daily as directed. (FOR ICD-10 E10.9, E11.9). 1 each 0  ? ciprofloxacin (CIPRO) 500 MG tablet Take 1 tablet (500 mg total) by mouth every 12 (twelve) hours. 10 tablet 0  ? ferrous sulfate 325 (65 FE) MG tablet Take 325 mg by mouth daily with breakfast.    ? fluticasone (FLONASE) 50 MCG/ACT nasal spray Place 1 spray into both nostrils 2 (two) times daily.    ? furosemide  (LASIX) 40 MG tablet Take 1 tablet (40 mg total) by mouth daily. 30 tablet   ? gabapentin (NEURONTIN) 300 MG capsule Take 600 mg by mouth 3 (three) times daily.    ? HYDROcodone-acetaminophen (NORCO) 7.5-325 MG tablet Take 1 tablet by mouth every 6 (six) hours as needed for moderate pain.    ? INCRUSE ELLIPTA 62.5 MCG/ACT AEPB Inhale 1 puff into the lungs daily.    ? ipratropium-albuterol (DUONEB) 0.5-2.5 (3) MG/3ML SOLN Take 3 mLs by nebulization every 6 (six) hours as needed (SOB).    ? Multiple Vitamin (MULTIVITAMIN WITH MINERALS) TABS tablet Take 1 tablet by mouth daily.    ? pantoprazole (PROTONIX) 40 MG tablet Take 1 tablet (40 mg total) by mouth 2 (two) times daily. (Patient taking differently: Take 40 mg by mouth daily.) 60 tablet 2  ? polyethylene glycol (MIRALAX / GLYCOLAX) 17 g packet Take 17 g by mouth daily.    ? spironolactone (ALDACTONE) 25 MG tablet Take 0.5 tablets (12.5 mg total) by mouth daily.    ? tamsulosin (FLOMAX) 0.4 MG CAPS capsule Take 1 capsule (0.4 mg total) by mouth daily after supper. 30 capsule 0  ? venlafaxine XR (EFFEXOR-XR) 150 MG 24 hr capsule Take 1 capsule (150 mg total) by mouth daily with breakfast. 90 capsule 1  ? metoprolol succinate (TOPROL-XL) 25 MG 24 hr tablet Take 1 tablet (25 mg total) by mouth daily.    ? amoxicillin-clavulanate (AUGMENTIN) 875-125 MG tablet Take 1 tablet by mouth 2 (two) times daily. (Patient not taking: Reported on 06/29/2021)    ? apixaban (ELIQUIS) 5 MG TABS tablet Take 1 tablet (5 mg total) by mouth 2 (two) times daily. (Patient not taking: Reported on 06/29/2021) 60 tablet   ? diclofenac Sodium (VOLTAREN) 1 % GEL Apply 2 g topically in the morning and at bedtime.    ? nitrofurantoin, macrocrystal-monohydrate, (MACROBID) 100 MG capsule Take 1 capsule (100 mg total) by mouth every 12 (twelve) hours. (Patient not taking: Reported on 06/21/2021) 20 capsule 0  ? nitrofurantoin, macrocrystal-monohydrate, (MACROBID) 100 MG capsule Take 1 capsule (100 mg  total) by mouth at bedtime. To be started after completing 10 days of macrobid short term antibiotic (Patient not taking: Reported on 06/21/2021) 30 capsule 2  ? SPIRIVA HANDIHALER 18 MCG inhalation capsule Place 1 capsule into inhaler and inhale daily. (Patient not taking: Reported on 06/21/2021)    ? ?No facility-administered medications prior to visit.  ?  ? ?Allergies:   Demerol [meperidine hcl] and Morphine and related  ? ?Social History  ? ?Socioeconomic History  ? Marital status: Divorced  ?  Spouse name: Not on file  ? Number of children: Not on file  ? Years of education: Not on file  ? Highest education level: Not on file  ?Occupational History  ? Not on file  ?Tobacco Use  ? Smoking status: Never  ? Smokeless tobacco: Never  ?Vaping Use  ? Vaping Use: Never used  ?Substance and Sexual Activity  ?  Alcohol use: Not Currently  ? Drug use: Never  ? Sexual activity: Not Currently  ?Other Topics Concern  ? Not on file  ?Social History Narrative  ? Lives at [place]  ? ?Social Determinants of Health  ? ?Financial Resource Strain: Not on file  ?Food Insecurity: Not on file  ?Transportation Needs: Not on file  ?Physical Activity: Not on file  ?Stress: Not on file  ?Social Connections: Not on file  ?  ? ?Family History:  The patient's family history includes COPD in her brother; Heart disease in her father.  ? ?Review of Systems:   ? ?Please see the history of present illness.    ? ?All other systems reviewed and are otherwise negative except as noted above. ? ? ?Physical Exam:   ? ?VS:  BP 110/70   Pulse (!) 101   Ht _0  (1.651 m)   Wt 184 lb (83.5 kg) Comment: weighed on 06/18/21  SpO2 98%   BMI 30.62 kg/m?    ?General: Pleasant elderly female appearing in no acute distress. ?Head: Normocephalic, atraumatic. ?Neck: No carotid bruits. JVD not elevated.  ?Lungs: Respirations regular and unlabored, without wheezes or rales. On 2L [place] at baseline.  ?Heart: Irregularly irregular. No S3 or S4.  No murmur, no rubs, or  gallops appreciated. ?Abdomen: Appears non-distended. No obvious abdominal masses. ?Msk:  Strength and tone appear normal for age. No obvious joint deformities or effusions. ?Extremities: No clubbing or cyanosis. T

## 2021-06-28 NOTE — H&P (View-Only) (Signed)
? ?Cardiology Office Note   ? ?Date:  06/30/2021  ? ?ID:  Krystal Ali, DOB 08/04/1945, MRN [id number] ? ?PCP:  Simpson-Tarokh, Leann, DO  ?Cardiologist: Heather E Pemberton, MD   ? ?Chief Complaint  ?Patient presents with  ? Follow-up  ?  1 week visit  ? ? ?History of Present Illness:   ? ?Krystal Ali is a 76 y.o. female with past medical history of HTN, HLD, Type 2 DM, newly diagnosed atrial fibrillation with RVR and new cardiomyopathy who presents for a 1-week follow-up visit.  ? ?She wad admitted to [facility] in 05/2021 for evaluation of dyspnea and was found to be in atrial fibrillation with RVR and to have a new cardiomyopathy with EF of 20 to 25%.  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 She did have a telehealth visit with myself on 06/21/2021 and reported occasional episodes of chest discomfort but denied any specific palpitations. It had previously been recommended to consider DCCV following 3 weeks of anticoagulation, therefore an in office visit was arranged and if still in atrial fibrillation, would schedule a DCCV. She was continued on Eliquis 5 mg twice daily, Amiodarone 200 mg daily and Toprol-XL 25 mg daily. ? ?In talking with the patient today and one of her caregivers from SNF, she reports that her breathing has been stable since her hospitalization.  She does use 2 L nasal cannula at baseline. No recent orthopnea or PND and her lower extremity edema has improved. She denies any specific chest pain or palpitations. Does report feeling "jittery" at times. She remains on Eliquis for anticoagulation with no reported melena, hematochezia or hematuria. She does experience intermittent epistaxis and we reviewed the possible use of saline nasal spray to help with this. ? ?Past Medical  History:  ?Diagnosis Date  ? Anxiety   ? Diabetes mellitus without complication (HCC)   ? GERD (gastroesophageal reflux disease)   ? Hyperlipidemia   ? Hypertension   ? Retention of urine, unspecified   ? 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 ? ?Past Surgical History:  ?Procedure Laterality Date  ? APPENDECTOMY    ? BACK SURGERY  08/2020  ? BIOPSY  09/20/2020  ? Procedure: BIOPSY;  Surgeon: Castaneda Mayorga, Daniel, MD;  [facility]: [facility];  Service: Gastroenterology;;  ? BREAST SURGERY    ? CHOLECYSTECTOMY    ? COLONOSCOPY WITH PROPOFOL N/A 09/20/2020  ? Procedure: COLONOSCOPY WITH PROPOFOL;  Surgeon: Castaneda Mayorga, Daniel, MD;  Location: [facility];  Service: Gastroenterology;  Laterality: N/A;  ? ESOPHAGOGASTRODUODENOSCOPY (EGD) WITH PROPOFOL N/A 09/20/2020  ? Procedure: ESOPHAGOGASTRODUODENOSCOPY (EGD) WITH PROPOFOL;  Surgeon: Castaneda Mayorga, Daniel, MD;  Location: [facility];  Service: Gastroenterology;  Laterality: N/A;  ? POLYPECTOMY  09/20/2020  ? Procedure: POLYPECTOMY;  Surgeon: Castaneda Mayorga, Daniel, MD;  Location: [facility];  Service: Gastroenterology;;  ? ? ?Current Medications: ?Outpatient Medications Prior to Visit  ?Medication Sig Dispense Refill  ? acetaminophen (TYLENOL) 325 MG tablet Take 2 tablets (650 mg total) by mouth every 6 (six) hours as needed for mild pain (or Fever >/= 101). 12 tablet 0  ? amiodarone (PACERONE) 200 MG tablet Take 200 mg by mouth daily.    ? blood glucose meter kit and supplies Dispense   based on patient and insurance preference. Use up to four times daily as directed. (FOR ICD-10 E10.9, E11.9). 1 each 0  ? ciprofloxacin (CIPRO) 500 MG tablet Take 1 tablet (500 mg total) by mouth every 12 (twelve) hours. 10 tablet 0  ? ferrous sulfate 325 (65 FE) MG tablet Take 325 mg by mouth daily with breakfast.    ? fluticasone (FLONASE) 50 MCG/ACT nasal spray Place 1 spray into both nostrils 2 (two) times daily.    ? furosemide  (LASIX) 40 MG tablet Take 1 tablet (40 mg total) by mouth daily. 30 tablet   ? gabapentin (NEURONTIN) 300 MG capsule Take 600 mg by mouth 3 (three) times daily.    ? HYDROcodone-acetaminophen (NORCO) 7.5-325 MG tablet Take 1 tablet by mouth every 6 (six) hours as needed for moderate pain.    ? INCRUSE ELLIPTA 62.5 MCG/ACT AEPB Inhale 1 puff into the lungs daily.    ? ipratropium-albuterol (DUONEB) 0.5-2.5 (3) MG/3ML SOLN Take 3 mLs by nebulization every 6 (six) hours as needed (SOB).    ? Multiple Vitamin (MULTIVITAMIN WITH MINERALS) TABS tablet Take 1 tablet by mouth daily.    ? pantoprazole (PROTONIX) 40 MG tablet Take 1 tablet (40 mg total) by mouth 2 (two) times daily. (Patient taking differently: Take 40 mg by mouth daily.) 60 tablet 2  ? polyethylene glycol (MIRALAX / GLYCOLAX) 17 g packet Take 17 g by mouth daily.    ? spironolactone (ALDACTONE) 25 MG tablet Take 0.5 tablets (12.5 mg total) by mouth daily.    ? tamsulosin (FLOMAX) 0.4 MG CAPS capsule Take 1 capsule (0.4 mg total) by mouth daily after supper. 30 capsule 0  ? venlafaxine XR (EFFEXOR-XR) 150 MG 24 hr capsule Take 1 capsule (150 mg total) by mouth daily with breakfast. 90 capsule 1  ? metoprolol succinate (TOPROL-XL) 25 MG 24 hr tablet Take 1 tablet (25 mg total) by mouth daily.    ? amoxicillin-clavulanate (AUGMENTIN) 875-125 MG tablet Take 1 tablet by mouth 2 (two) times daily. (Patient not taking: Reported on 06/29/2021)    ? apixaban (ELIQUIS) 5 MG TABS tablet Take 1 tablet (5 mg total) by mouth 2 (two) times daily. (Patient not taking: Reported on 06/29/2021) 60 tablet   ? diclofenac Sodium (VOLTAREN) 1 % GEL Apply 2 g topically in the morning and at bedtime.    ? nitrofurantoin, macrocrystal-monohydrate, (MACROBID) 100 MG capsule Take 1 capsule (100 mg total) by mouth every 12 (twelve) hours. (Patient not taking: Reported on 06/21/2021) 20 capsule 0  ? nitrofurantoin, macrocrystal-monohydrate, (MACROBID) 100 MG capsule Take 1 capsule (100 mg  total) by mouth at bedtime. To be started after completing 10 days of macrobid short term antibiotic (Patient not taking: Reported on 06/21/2021) 30 capsule 2  ? SPIRIVA HANDIHALER 18 MCG inhalation capsule Place 1 capsule into inhaler and inhale daily. (Patient not taking: Reported on 06/21/2021)    ? ?No facility-administered medications prior to visit.  ?  ? ?Allergies:   Demerol [meperidine hcl] and Morphine and related  ? ?Social History  ? ?Socioeconomic History  ? Marital status: Divorced  ?  Spouse name: Not on file  ? Number of children: Not on file  ? Years of education: Not on file  ? Highest education level: Not on file  ?Occupational History  ? Not on file  ?Tobacco Use  ? Smoking status: Never  ? Smokeless tobacco: Never  ?Vaping Use  ? Vaping Use: Never used  ?Substance and Sexual Activity  ?   Alcohol use: Not Currently  ? Drug use: Never  ? Sexual activity: Not Currently  ?Other Topics Concern  ? Not on file  ?Social History Narrative  ? Lives at [facility]  ? ?Social Determinants of Health  ? ?Financial Resource Strain: Not on file  ?Food Insecurity: Not on file  ?Transportation Needs: Not on file  ?Physical Activity: Not on file  ?Stress: Not on file  ?Social Connections: Not on file  ?  ? ?Family History:  The patient's family history includes COPD in her brother; Heart disease in her father.  ? ?Review of Systems:   ? ?Please see the history of present illness.    ? ?All other systems reviewed and are otherwise negative except as noted above. ? ? ?Physical Exam:   ? ?VS:  BP 110/70   Pulse (!) 101   Ht 5' 5" (1.651 m)   Wt 184 lb (83.5 kg) Comment: weighed on 06/18/21  SpO2 98%   BMI 30.62 kg/m?    ?General: Pleasant elderly female appearing in no acute distress. ?Head: Normocephalic, atraumatic. ?Neck: No carotid bruits. JVD not elevated.  ?Lungs: Respirations regular and unlabored, without wheezes or rales. On 2L [place] at baseline.  ?Heart: Irregularly irregular. No S3 or S4.  No murmur, no rubs, or  gallops appreciated. ?Abdomen: Appears non-distended. No obvious abdominal masses. ?Msk:  Strength and tone appear normal for age. No obvious joint deformities or effusions. ?Extremities: No clubbing or cyanosis. T

## 2021-06-29 ENCOUNTER — Encounter: Payer: Self-pay | Admitting: Student

## 2021-06-29 ENCOUNTER — Telehealth: Payer: Self-pay | Admitting: Student

## 2021-06-29 ENCOUNTER — Ambulatory Visit (INDEPENDENT_AMBULATORY_CARE_PROVIDER_SITE_OTHER): Payer: Medicare (Managed Care) | Admitting: Student

## 2021-06-29 VITALS — BP 110/70 | HR 101 | Ht 65.0 in | Wt 184.0 lb

## 2021-06-29 DIAGNOSIS — I4819 Other persistent atrial fibrillation: Secondary | ICD-10-CM | POA: Diagnosis not present

## 2021-06-29 DIAGNOSIS — I5020 Unspecified systolic (congestive) heart failure: Secondary | ICD-10-CM

## 2021-06-29 DIAGNOSIS — M48062 Spinal stenosis, lumbar region with neurogenic claudication: Secondary | ICD-10-CM

## 2021-06-29 DIAGNOSIS — I10 Essential (primary) hypertension: Secondary | ICD-10-CM

## 2021-06-29 DIAGNOSIS — Z01818 Encounter for other preprocedural examination: Secondary | ICD-10-CM | POA: Diagnosis not present

## 2021-06-29 MED ORDER — METOPROLOL SUCCINATE ER 25 MG PO TB24: 37.5000 mg | ORAL_TABLET | Freq: Every day | ORAL | 3 refills | Status: DC

## 2021-06-29 NOTE — Patient Instructions (Addendum)
Medication Instructions:  ?INCREASE Toprol to 37.5 mg daily ? ?Labwork: ?Cbc,bmet on day you meet with anesthesia (pre-op) appointment ? ?Testing/Procedures: ?Your physician has recommended that you have a Cardioversion (DCCV). Electrical Cardioversion uses a jolt of electricity to your heart either through paddles or wired patches attached to your chest. This is a controlled, usually prescheduled, procedure. Defibrillation is done under light anesthesia in the hospital, and you usually go home the day of the procedure. This is done to get your heart back into a normal rhythm. You are not awake for the procedure. Please see the instruction sheet given to you today.  ? ?Follow-Up: ?3-4 weeks after cardioversion ? ?Any Other Special Instructions Will Be Listed Below (If Applicable). ? ?If you need a refill on your cardiac medications before your next appointment, please call your pharmacy. ? ? ? ?1:10 pm Addendum I spoke with Debbie at [place] [phone] and gave her dates for pre-op with anesthesia 4/26 at 3:15 pm -arrive main entrance [facility] at 3 pm for check-in and Friday 4/28 at 12:45 pm for DCCV. I also left message for Delcie Roch who does the transportation with apt information. ?

## 2021-06-29 NOTE — Telephone Encounter (Signed)
PERCERT  ? ?Cardioversion next week- ?

## 2021-07-03 NOTE — Patient Instructions (Signed)
? ? ? ? ? ? Krystal Ali ? 07/03/2021  ?  ? '@PREFPERIOPPHARMACY'$ @ ? ? Your procedure is scheduled on  07/06/2021. ? ? Report to [facility] at  24  A.M. ? ? Call this number if you have problems the morning of surgery: ? [phone] ? ? Remember: ? Do not eat or drink after midnight. ?  ? ?  DO NOT miss any doses of your eliquis. ? ?   Use your nebulizer and your inhalers before you come and bring your rescue inhaler with you. ? ?   DO NOT take any medications for diabetes the morning of your procedure. ? ?  ? Take these medicines the morning of surgery with A SIP OF WATER  ? ?                pacerone, hydrocodone(if needed), effexor. ?  ? ? Do not wear jewelry, make-up or nail polish. ? Do not wear lotions, powders, or perfumes, or deodorant. ? Do not shave 48 hours prior to surgery.  Men may shave face and neck. ? Do not bring valuables to the hospital. ? [facility] is not responsible for any belongings or valuables. ? ?Contacts, dentures or bridgework may not be worn into surgery.  Leave your suitcase in the car.  After surgery it may be brought to your room. ? ?For patients admitted to the hospital, discharge time will be determined by your treatment team. ? ?Patients discharged the day of surgery will not be allowed to drive home and must have someone with them for 24 hours.  ? ? ?Special instructions:   DO NOT smoke tobacco or vape for 24 hours before your procedure. ? ?Please read over the following fact sheets that you were given. ?Anesthesia Post-op Instructions and Care and Recovery After Surgery ?  ? ? ? Electrical Cardioversion ?Electrical cardioversion is the delivery of a jolt of electricity to restore a normal rhythm to the heart. A rhythm that is too fast or is not regular keeps the heart from pumping well. In this procedure, sticky patches or metal paddles are placed on the chest to deliver electricity to the heart from a device. ?This procedure may be done in an emergency if: ?There is low or  no blood pressure as a result of the heart rhythm. ?Normal rhythm must be restored as fast as possible to protect the brain and heart from further damage. ?It may save a life. ?This may also be a scheduled procedure for irregular or fast heart rhythms that are not immediately life-threatening. ?Tell a health care provider about: ?Any allergies you have. ?All medicines you are taking, including vitamins, herbs, eye drops, creams, and over-the-counter medicines. ?Any problems you or family members have had with anesthetic medicines. ?Any blood disorders you have. ?Any surgeries you have had. ?Any medical conditions you have. ?Whether you are pregnant or may be pregnant. ?What are the risks? ?Generally, this is a safe procedure. However, problems may occur, including: ?Allergic reactions to medicines. ?A blood clot that breaks free and travels to other parts of your body. ?The possible return of an abnormal heart rhythm within hours or days after the procedure. ?Your heart stopping (cardiac arrest). This is rare. ?What happens before the procedure? ?Medicines ?Your health care provider may have you start taking: ?Blood-thinning medicines (anticoagulants) so your blood does not clot as easily. ?Medicines to help stabilize your heart rate and rhythm. ?Ask your health care provider about: ?Changing or stopping your  regular medicines. This is especially important if you are taking diabetes medicines or blood thinners. ?Taking medicines such as aspirin and ibuprofen. These medicines can thin your blood. Do not take these medicines unless your health care provider tells you to take them. ?Taking over-the-counter medicines, vitamins, herbs, and supplements. ?General instructions ?Follow instructions from your health care provider about eating or drinking restrictions. ?Plan to have someone take you home from the hospital or clinic. ?If you will be going home right after the procedure, plan to have someone with you for 24  hours. ?Ask your health care provider what steps will be taken to help prevent infection. These may include washing your skin with a germ-killing soap. ?What happens during the procedure? ? ?An IV will be inserted into one of your veins. ?Sticky patches (electrodes) or metal paddles may be placed on your chest. ?You will be given a medicine to help you relax (sedative). ?An electrical shock will be delivered. ?The procedure may vary among health care providers and hospitals. ?What can I expect after the procedure? ?Your blood pressure, heart rate, breathing rate, and blood oxygen level will be monitored until you leave the hospital or clinic. ?Your heart rhythm will be watched to make sure it does not change. ?You may have some redness on the skin where the shocks were given. ?Follow these instructions at home: ?Do not drive for 24 hours if you were given a sedative during your procedure. ?Take over-the-counter and prescription medicines only as told by your health care provider. ?Ask your health care provider how to check your pulse. Check it often. ?Rest for 48 hours after the procedure or as told by your health care provider. ?Avoid or limit your caffeine use as told by your health care provider. ?Keep all follow-up visits as told by your health care provider. This is important. ?Contact a health care provider if: ?You feel like your heart is beating too quickly or your pulse is not regular. ?You have a serious muscle cramp that does not go away. ?Get help right away if: ?You have discomfort in your chest. ?You are dizzy or you feel faint. ?You have trouble breathing or you are short of breath. ?Your speech is slurred. ?You have trouble moving an arm or leg on one side of your body. ?Your fingers or toes turn cold or blue. ?Summary ?Electrical cardioversion is the delivery of a jolt of electricity to restore a normal rhythm to the heart. ?This procedure may be done right away in an emergency or may be a scheduled  procedure if the condition is not an emergency. ?Generally, this is a safe procedure. ?After the procedure, check your pulse often as told by your health care provider. ?This information is not intended to replace advice given to you by your health care provider. Make sure you discuss any questions you have with your health care provider. ?Document Revised: 01/25/2021 Document Reviewed: 09/28/2018 ?Elsevier Patient Education ? [place]. ?Monitored Anesthesia Care, Care After ?This sheet gives you information about how to care for yourself after your procedure. Your health care provider may also give you more specific instructions. If you have problems or questions, contact your health care provider. ?What can I expect after the procedure? ?After the procedure, it is common to have: ?Tiredness. ?Forgetfulness about what happened after the procedure. ?Impaired judgment for important decisions. ?Nausea or vomiting. ?Some difficulty with balance. ?Follow these instructions at home: ?For the time period you were told by your health care  provider: ? ?  ? ?Rest as needed. ?Do not participate in activities where you could fall or become injured. ?Do not drive or use machinery. ?Do not drink alcohol. ?Do not take sleeping pills or medicines that cause drowsiness. ?Do not make important decisions or sign legal documents. ?Do not take care of children on your own. ?Eating and drinking ?Follow the diet that is recommended by your health care provider. ?Drink enough fluid to keep your urine pale yellow. ?If you vomit: ?Drink water, juice, or soup when you can drink without vomiting. ?Make sure you have little or no nausea before eating solid foods. ?General instructions ?Have a responsible adult stay with you for the time you are told. It is important to have someone help care for you until you are awake and alert. ?Take over-the-counter and prescription medicines only as told by your health care provider. ?If you have  sleep apnea, surgery and certain medicines can increase your risk for breathing problems. Follow instructions from your health care provider about wearing your sleep device: ?Anytime you are sleeping, in

## 2021-07-04 ENCOUNTER — Encounter (HOSPITAL_COMMUNITY)
Admission: RE | Admit: 2021-07-04 | Discharge: 2021-07-04 | Disposition: A | Discharge status: Home / Self Care | Payer: Medicare (Managed Care) | Source: Ambulatory Visit | Attending: Cardiology | Admitting: Cardiology

## 2021-07-04 ENCOUNTER — Encounter (HOSPITAL_COMMUNITY): Payer: Self-pay

## 2021-07-04 VITALS — BP 99/78 | HR 89 | Temp 97.8°F | Resp 18 | Ht 65.0 in | Wt 176.0 lb

## 2021-07-04 DIAGNOSIS — Z01812 Encounter for preprocedural laboratory examination: Secondary | ICD-10-CM | POA: Insufficient documentation

## 2021-07-04 DIAGNOSIS — D649 Anemia, unspecified: Secondary | ICD-10-CM | POA: Diagnosis not present

## 2021-07-04 DIAGNOSIS — E119 Type 2 diabetes mellitus without complications: Secondary | ICD-10-CM | POA: Diagnosis not present

## 2021-07-04 DIAGNOSIS — Z20822 Contact with and (suspected) exposure to covid-19: Secondary | ICD-10-CM | POA: Diagnosis not present

## 2021-07-04 HISTORY — DX: Cardiac arrhythmia, unspecified: I49.9

## 2021-07-04 HISTORY — DX: Sleep apnea, unspecified: G47.30

## 2021-07-04 HISTORY — DX: Unspecified atrial fibrillation: I48.91

## 2021-07-04 LAB — CBC WITH DIFFERENTIAL/PLATELET
Abs Immature Granulocytes: 0 10*3/uL (ref 0.00–0.07)
Basophils Absolute: 0 10*3/uL (ref 0.0–0.1)
Basophils Relative: 1 %
Eosinophils Absolute: 0 10*3/uL (ref 0.0–0.5)
Eosinophils Relative: 1 %
HCT: 32.5 % — ABNORMAL LOW (ref 36.0–46.0)
Hemoglobin: 10.5 g/dL — ABNORMAL LOW (ref 12.0–15.0)
Immature Granulocytes: 0 %
Lymphocytes Relative: 31 %
Lymphs Abs: 1.3 10*3/uL (ref 0.7–4.0)
MCH: 32.2 pg (ref 26.0–34.0)
MCHC: 32.3 g/dL (ref 30.0–36.0)
MCV: 99.7 fL (ref 80.0–100.0)
Monocytes Absolute: 0.3 10*3/uL (ref 0.1–1.0)
Monocytes Relative: 8 %
Neutro Abs: 2.5 10*3/uL (ref 1.7–7.7)
Neutrophils Relative %: 59 %
Platelets: 169 10*3/uL (ref 150–400)
RBC: 3.26 MIL/uL — ABNORMAL LOW (ref 3.87–5.11)
RDW: 12.9 % (ref 11.5–15.5)
WBC: 4.2 10*3/uL (ref 4.0–10.5)
nRBC: 0 % (ref 0.0–0.2)

## 2021-07-04 LAB — BASIC METABOLIC PANEL
Anion gap: 7 (ref 5–15)
BUN: 46 mg/dL — ABNORMAL HIGH (ref 8–23)
CO2: 30 mmol/L (ref 22–32)
Calcium: 8.5 mg/dL — ABNORMAL LOW (ref 8.9–10.3)
Chloride: 98 mmol/L (ref 98–111)
Creatinine, Ser: 1.1 mg/dL — ABNORMAL HIGH (ref 0.44–1.00)
GFR, Estimated: 52 mL/min — ABNORMAL LOW (ref >60)
Glucose, Bld: 83 mg/dL (ref 70–99)
Potassium: 4.1 mmol/L (ref 3.5–5.1)
Sodium: 135 mmol/L (ref 135–145)

## 2021-07-04 LAB — HEMOGLOBIN A1C
Hgb A1c MFr Bld: 6 % — ABNORMAL HIGH (ref 4.8–5.6)
Mean Plasma Glucose: 125.5 mg/dL

## 2021-07-05 MED ORDER — METOPROLOL TARTRATE 100 MG PO TABS: ORAL_TABLET | ORAL | 0 refills | Status: DC

## 2021-07-06 ENCOUNTER — Ambulatory Visit (HOSPITAL_COMMUNITY)
Admission: RE | Admit: 2021-07-06 | Discharge: 2021-07-06 | Disposition: A | Discharge status: Home / Self Care | Payer: Medicare (Managed Care) | Attending: Cardiology | Admitting: Cardiology

## 2021-07-06 ENCOUNTER — Ambulatory Visit (HOSPITAL_BASED_OUTPATIENT_CLINIC_OR_DEPARTMENT_OTHER): Payer: Medicare (Managed Care) | Admitting: Anesthesiology

## 2021-07-06 ENCOUNTER — Encounter (HOSPITAL_COMMUNITY)
Admission: RE | Disposition: A | Discharge status: Home / Self Care | Payer: Self-pay | Source: Home / Self Care | Attending: Cardiology

## 2021-07-06 ENCOUNTER — Ambulatory Visit (HOSPITAL_COMMUNITY): Payer: Medicare (Managed Care) | Admitting: Anesthesiology

## 2021-07-06 DIAGNOSIS — I509 Heart failure, unspecified: Secondary | ICD-10-CM | POA: Diagnosis not present

## 2021-07-06 DIAGNOSIS — K219 Gastro-esophageal reflux disease without esophagitis: Secondary | ICD-10-CM | POA: Insufficient documentation

## 2021-07-06 DIAGNOSIS — Z7984 Long term (current) use of oral hypoglycemic drugs: Secondary | ICD-10-CM

## 2021-07-06 DIAGNOSIS — F419 Anxiety disorder, unspecified: Secondary | ICD-10-CM | POA: Diagnosis not present

## 2021-07-06 DIAGNOSIS — I110 Hypertensive heart disease with heart failure: Secondary | ICD-10-CM | POA: Diagnosis not present

## 2021-07-06 DIAGNOSIS — I4819 Other persistent atrial fibrillation: Secondary | ICD-10-CM

## 2021-07-06 DIAGNOSIS — M1990 Unspecified osteoarthritis, unspecified site: Secondary | ICD-10-CM | POA: Diagnosis not present

## 2021-07-06 DIAGNOSIS — F32A Depression, unspecified: Secondary | ICD-10-CM | POA: Insufficient documentation

## 2021-07-06 DIAGNOSIS — Z79899 Other long term (current) drug therapy: Secondary | ICD-10-CM | POA: Insufficient documentation

## 2021-07-06 DIAGNOSIS — E119 Type 2 diabetes mellitus without complications: Secondary | ICD-10-CM

## 2021-07-06 HISTORY — PX: CARDIOVERSION: SHX1299

## 2021-07-06 SURGERY — CARDIOVERSION
Anesthesia: General

## 2021-07-06 MED ORDER — METOPROLOL SUCCINATE ER 25 MG PO TB24: 12.5000 mg | ORAL_TABLET | Freq: Every day | ORAL | 3 refills | Status: DC

## 2021-07-06 MED ORDER — PROPOFOL 10 MG/ML IV BOLUS
INTRAVENOUS | Status: DC | PRN
  Administered 2021-07-06: 50 mg via INTRAVENOUS

## 2021-07-06 MED ORDER — LACTATED RINGERS IV SOLN: INTRAVENOUS | Status: DC | PRN

## 2021-07-06 SURGICAL SUPPLY — 2 items
GLOVE BIOGEL PI IND STRL 7.0 (GLOVE) ×2 IMPLANT
GLOVE BIOGEL PI INDICATOR 7.0 (GLOVE) ×2

## 2021-07-06 NOTE — Transfer of Care (Signed)
Immediate Anesthesia Transfer of Care Note ? ?Patient: Krystal Ali ? ?Procedure(s) Performed: CARDIOVERSION ? ?Patient Location: PACU ? ?Anesthesia Type:General ? ?Level of Consciousness: drowsy ? ?Airway & Oxygen Therapy: Patient Spontanous Breathing and Patient connected to nasal cannula oxygen ? ?Post-op Assessment: Report given to RN and Post -op Vital signs reviewed and stable ? ?Post vital signs: Reviewed and stable ? ?Last Vitals:  ?Vitals Value Taken Time  ?BP 89/56   ?Temp 98.3   ?Pulse 56   ?Resp 19   ?SpO2 96%   ? ? ?Last Pain:  ?Vitals:  ? 07/06/21 1249  ?TempSrc: Oral  ?PainSc: 0-No pain  ?   ? ?Patients Stated Pain Goal: 8 (07/06/21 1249) ? ?Complications: No notable events documented. ?

## 2021-07-06 NOTE — Anesthesia Preprocedure Evaluation (Signed)
Anesthesia Evaluation  ?Patient identified by MRN, date of birth, ID band ?Patient awake ? ? ? ?Reviewed: ?Allergy & Precautions, NPO status , Patient's Chart, lab work & pertinent test results, reviewed documented beta blocker date and time  ? ?Airway ?Mallampati: I ? ?TM Distance: >3 FB ?Neck ROM: Full ? ? ? Dental ? ?(+) Edentulous Upper, Edentulous Lower ?  ?Pulmonary ?shortness of breath, with exertion and Long-Term Oxygen Therapy, sleep apnea, Continuous Positive Airway Pressure Ventilation and Oxygen sleep apnea ,  ?  ?Pulmonary exam normal ?breath sounds clear to auscultation ? ? ? ? ? ? Cardiovascular ?Exercise Tolerance: Poor ?hypertension, Pt. on medications and Pt. on home beta blockers ?+CHF  ?+ dysrhythmias Atrial Fibrillation  ?Rhythm:Irregular Rate:Abnormal ? ? ?  ?Neuro/Psych ?PSYCHIATRIC DISORDERS Anxiety Depression  Neuromuscular disease   ? GI/Hepatic ?Neg liver ROS, GERD  Medicated and Controlled,  ?Endo/Other  ?diabetes, Well Controlled, Type 2, Oral Hypoglycemic Agents ? Renal/GU ?Renal disease Bladder dysfunction ? ? ? ?  ?Musculoskeletal ? ?(+) Arthritis , Osteoarthritis,   ? Abdominal ?  ?Peds ?negative pediatric ROS ?(+)  Hematology ? ?(+) Blood dyscrasia, anemia ,   ?Anesthesia Other Findings ? ? Reproductive/Obstetrics ?negative OB ROS ? ?  ? ? ? ? ? ? ? ? ? ? ? ? ? ?  ?  ? ? ? ? ? ? ? ? ?Anesthesia Physical ?Anesthesia Plan ? ?ASA: 3 ? ?Anesthesia Plan: General  ? ?Post-op Pain Management: Minimal or no pain anticipated  ? ?Induction: Intravenous ? ?PONV Risk Score and Plan: Propofol infusion ? ?Airway Management Planned: Nasal Cannula and Natural Airway ? ?Additional Equipment:  ? ?Intra-op Plan:  ? ?Post-operative Plan:  ? ?Informed Consent: I have reviewed the patients History and Physical, chart, labs and discussed the procedure including the risks, benefits and alternatives for the proposed anesthesia with the patient or authorized representative  who has indicated his/her understanding and acceptance.  ? ? ? ?Dental advisory given ? ?Plan Discussed with: CRNA and Surgeon ? ?Anesthesia Plan Comments:   ? ? ? ? ? ? ?Anesthesia Quick Evaluation ? ?

## 2021-07-06 NOTE — Interval H&P Note (Signed)
History and Physical Interval Note: ? ?07/06/2021 ?12:24 PM ? ?Krystal Ali  has presented today for surgery, with the diagnosis of a-fib.  The various methods of treatment have been discussed with the patient and family. After consideration of risks, benefits and other options for treatment, the patient has consented to  Procedure(s): ?CARDIOVERSION (N/A) as a surgical intervention.  The patient's history has been reviewed, patient examined, no change in status, stable for surgery.  I have reviewed the patient's chart and labs.  Questions were answered to the patient's satisfaction.   ? ?Patient of Dr. Johney Frame scheduled for elective cardioversion for treatment of persistent atrial fibrillation.  She has been consistent with Eliquis for stroke prophylaxis, also on oral amiodarone along with Toprol-XL.  Lab work shows creatinine 1.1 and hemoglobin 10.5. ? ?[name] ? ? ?

## 2021-07-06 NOTE — Anesthesia Procedure Notes (Signed)
Date/Time: 07/06/2021 1:13 PM ?Performed by: Karna Dupes, CRNA ?Pre-anesthesia Checklist: Patient identified, Emergency Drugs available, Suction available and Patient being monitored ?Oxygen Delivery Method: Nasal cannula ? ? ? ? ?

## 2021-07-06 NOTE — CV Procedure (Signed)
Elective direct-current cardioversion ? ?Indication: Persistent atrial fibrillation ? ?Description of procedure: After informed consent was obtained the patient was taken to the PACU where a timeout was performed.  Anterior and posterior pads were placed in standard fashion and connected to a biphasic defibrillator.  Deep sedation was achieved with use of propofol, administered and monitored by the anesthesia team.  A sandbag was placed on the anterior chest pad.  A single 150 J synchronized shock was delivered with successful restoration of sinus rhythm.  The patient remained hemodynamically stable throughout and there were no immediate complications.  Sinus rhythm confirmed by ECG. ? ?Disposition: After appropriate monitoring patient will be returned to her nursing facility.  Beta-blocker dose being reduced.  Office follow-up arranged. ? ?Satira Sark, M.D., F.A.C.C. ? ?

## 2021-07-06 NOTE — Anesthesia Postprocedure Evaluation (Signed)
Anesthesia Post Note ? ?Patient: Krystal Ali ? ?Procedure(s) Performed: CARDIOVERSION ? ?Patient location during evaluation: Phase II ?Anesthesia Type: General ?Level of consciousness: awake and alert and oriented ?Pain management: pain level controlled ?Vital Signs Assessment: post-procedure vital signs reviewed and stable ?Respiratory status: spontaneous breathing, nonlabored ventilation and respiratory function stable ?Cardiovascular status: blood pressure returned to baseline and stable ?Postop Assessment: no apparent nausea or vomiting ?Anesthetic complications: no ? ? ?No notable events documented. ? ? ?Last Vitals:  ?Vitals:  ? 07/06/21 1435 07/06/21 1438  ?BP: 99/76 99/76  ?Pulse:  (!) 55  ?Resp:  18  ?Temp:  36.9 ?C  ?SpO2:  97%  ?  ?Last Pain:  ?Vitals:  ? 07/06/21 1440  ?TempSrc:   ?PainSc: 0-No pain  ? ? ?  ?  ?  ?  ?  ?  ? ?Jaylah Goodlow C Josyah Achor ? ? ? ? ?

## 2021-07-07 ENCOUNTER — Emergency Department (HOSPITAL_COMMUNITY)
Admission: EM | Admit: 2021-07-07 | Discharge: 2021-07-08 | Disposition: A | Discharge status: Home / Self Care | Payer: Medicare (Managed Care) | Attending: Student | Admitting: Student

## 2021-07-07 ENCOUNTER — Encounter (HOSPITAL_COMMUNITY): Payer: Self-pay

## 2021-07-07 ENCOUNTER — Other Ambulatory Visit: Payer: Self-pay

## 2021-07-07 DIAGNOSIS — I110 Hypertensive heart disease with heart failure: Secondary | ICD-10-CM | POA: Insufficient documentation

## 2021-07-07 DIAGNOSIS — Z79899 Other long term (current) drug therapy: Secondary | ICD-10-CM | POA: Diagnosis not present

## 2021-07-07 DIAGNOSIS — I952 Hypotension due to drugs: Secondary | ICD-10-CM | POA: Diagnosis not present

## 2021-07-07 DIAGNOSIS — E119 Type 2 diabetes mellitus without complications: Secondary | ICD-10-CM | POA: Insufficient documentation

## 2021-07-07 DIAGNOSIS — Z7951 Long term (current) use of inhaled steroids: Secondary | ICD-10-CM | POA: Insufficient documentation

## 2021-07-07 DIAGNOSIS — I5021 Acute systolic (congestive) heart failure: Secondary | ICD-10-CM | POA: Insufficient documentation

## 2021-07-07 DIAGNOSIS — R42 Dizziness and giddiness: Secondary | ICD-10-CM | POA: Diagnosis present

## 2021-07-07 DIAGNOSIS — Z7901 Long term (current) use of anticoagulants: Secondary | ICD-10-CM | POA: Diagnosis not present

## 2021-07-07 LAB — CBC WITH DIFFERENTIAL/PLATELET
Abs Immature Granulocytes: 0.01 10*3/uL (ref 0.00–0.07)
Basophils Absolute: 0 10*3/uL (ref 0.0–0.1)
Basophils Relative: 1 %
Eosinophils Absolute: 0.1 10*3/uL (ref 0.0–0.5)
Eosinophils Relative: 2 %
HCT: 29.3 % — ABNORMAL LOW (ref 36.0–46.0)
Hemoglobin: 9.7 g/dL — ABNORMAL LOW (ref 12.0–15.0)
Immature Granulocytes: 0 %
Lymphocytes Relative: 40 %
Lymphs Abs: 1.7 10*3/uL (ref 0.7–4.0)
MCH: 32.6 pg (ref 26.0–34.0)
MCHC: 33.1 g/dL (ref 30.0–36.0)
MCV: 98.3 fL (ref 80.0–100.0)
Monocytes Absolute: 0.4 10*3/uL (ref 0.1–1.0)
Monocytes Relative: 10 %
Neutro Abs: 2 10*3/uL (ref 1.7–7.7)
Neutrophils Relative %: 47 %
Platelets: 170 10*3/uL (ref 150–400)
RBC: 2.98 MIL/uL — ABNORMAL LOW (ref 3.87–5.11)
RDW: 13.3 % (ref 11.5–15.5)
WBC: 4.2 10*3/uL (ref 4.0–10.5)
nRBC: 0 % (ref 0.0–0.2)

## 2021-07-07 LAB — COMPREHENSIVE METABOLIC PANEL
ALT: 15 U/L (ref 0–44)
AST: 16 U/L (ref 15–41)
Albumin: 3.5 g/dL (ref 3.5–5.0)
Alkaline Phosphatase: 70 U/L (ref 38–126)
Anion gap: 7 (ref 5–15)
BUN: 46 mg/dL — ABNORMAL HIGH (ref 8–23)
CO2: 29 mmol/L (ref 22–32)
Calcium: 8.5 mg/dL — ABNORMAL LOW (ref 8.9–10.3)
Chloride: 99 mmol/L (ref 98–111)
Creatinine, Ser: 1.15 mg/dL — ABNORMAL HIGH (ref 0.44–1.00)
GFR, Estimated: 50 mL/min — ABNORMAL LOW (ref >60)
Glucose, Bld: 79 mg/dL (ref 70–99)
Potassium: 3.8 mmol/L (ref 3.5–5.1)
Sodium: 135 mmol/L (ref 135–145)
Total Bilirubin: 0.7 mg/dL (ref 0.3–1.2)
Total Protein: 6.5 g/dL (ref 6.5–8.1)

## 2021-07-07 LAB — TROPONIN I (HIGH SENSITIVITY)
Troponin I (High Sensitivity): 12 ng/L (ref <18)
Troponin I (High Sensitivity): 12 ng/L (ref <18)

## 2021-07-07 MED ORDER — LACTATED RINGERS IV BOLUS
500.0000 mL | Freq: Once | INTRAVENOUS | Status: AC
  Administered 2021-07-07: 500 mL via INTRAVENOUS

## 2021-07-07 NOTE — ED Triage Notes (Signed)
Patient sent via EMS from [facility] with complaints of feeling sick and hypotension. Recent cardiac procedure.  ?

## 2021-07-07 NOTE — ED Notes (Signed)
Pt family going to get pt food- discharge reviewed with family and pt ?

## 2021-07-07 NOTE — Discharge Instructions (Signed)
Krystal Ali was seen in the emergency department for evaluation of low blood pressure.  Her work-up was reassuringly normal and I think that her low blood pressure is likely due to the increased opioid she has been receiving.  It may be prudent to reduce her opioid dose to half if possible.  At this time she is safe for discharge.  Please return her to the emergency department if she has any additional episodes of dizziness, low blood pressure, chest pain, shortness of breath or any other concerning symptoms. ?

## 2021-07-07 NOTE — ED Provider Notes (Signed)
?[facility] ?Provider Note ? ?CSN: [id number] ?Arrival date & time: 07/07/21 1809 ? ?Chief Complaint(s) ?Hypotension ? ?HPI ?Krystal Ali is a 76 y.o. female with PMH HTN, HLD, T2DM, recent admission for A-fib with RVR and cardioversion performed yesterday in the outpatient setting.  Patient sent from SNF due to an episode of transient dizziness with hypotension in the field.  She currently states that she feels back to normal and denies chest pain, shortness of breath, abdominal pain, nausea, vomiting or other systemic symptoms. ? ? ?Past Medical History ?Past Medical History:  ?Diagnosis Date  ? Anxiety   ? Atrial fibrillation ([place])   ? Diabetes mellitus without complication ([place])   ? Dysrhythmia   ? GERD (gastroesophageal reflux disease)   ? Hyperlipidemia   ? Hypertension   ? Retention of urine, unspecified   ? Sleep apnea   ? 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 ?Patient Active Problem List  ? Diagnosis Date Noted  ? Persistent atrial fibrillation ([place])   ? Nephrolithiasis 06/11/2021  ? Acute respiratory failure with hypoxia ([place]) 05/22/2021  ? Acute systolic CHF (congestive heart failure) ([place]) 05/22/2021  ? Frequent UTI 05/22/2021  ? Atrial fibrillation with RVR ([place]) 05/21/2021  ? SOB (shortness of breath)   ? Urinary tract infection with hematuria   ? History of UTI 11/28/2020  ? Incomplete bladder emptying 11/28/2020  ? Hematuria 09/23/2020  ? Abnormal vaginal bleeding with endometrial thickness less than 16 mm present on transvaginal ultrasound in POST-Menopausal patient 09/23/2020  ? Post-menopausal bleeding-EndoMetrial Thickening 28 mm 09/23/2020  ? Anemia   ? Guaiac positive stools   ? GI bleeding 09/18/2020  ? Lumbar stenosis with neurogenic claudication 08/18/2020  ? Anxiety 07/18/2020  ? Depression, recurrent ([place]) 07/18/2020  ? Chronic low back pain with right-sided sciatica 05/01/2020  ? Controlled type 2 diabetes mellitus without  complication, without long-term current use of insulin ([place]) 02/15/2020  ? History of back surgery 02/15/2020  ? ?Home Medication(s) ?Prior to Admission medications   ?Medication Sig Start Date End Date Taking? Authorizing Provider  ?acetaminophen (TYLENOL) 325 MG tablet Take 2 tablets (650 mg total) by mouth every 6 (six) hours as needed for mild pain (or Fever >/= 101). ?Patient taking differently: Take 650 mg by mouth every 6 (six) hours as needed for mild pain (or Fever >/= 101). Do not exceed 3,000 mg in 24 hrs 09/23/20   Roxan Hockey, MD  ?amiodarone (PACERONE) 200 MG tablet Take 200 mg by mouth daily.    [provider]  ?apixaban (ELIQUIS) 5 MG TABS tablet Take 1 tablet (5 mg total) by mouth 2 (two) times daily. 05/29/21   Kathie Dike, MD  ?blood glucose meter kit and supplies Dispense based on patient and insurance preference. Use up to four times daily as directed. (FOR ICD-10 E10.9, E11.9). 02/15/20   [name], NP  ?ciprofloxacin (CIPRO) 500 MG tablet Take 1 tablet (500 mg total) by mouth every 12 (twelve) hours. ?Patient not taking: Reported on 07/03/2021 06/19/21   Primus Bravo., MD  ?diclofenac Sodium (VOLTAREN) 1 % GEL Apply 2 g topically in the morning and at bedtime.    [provider]  ?ferrous sulfate 325 (65 FE) MG tablet Take 325 mg by mouth daily with breakfast.    [provider]  ?fluticasone (FLONASE) 50 MCG/ACT nasal spray Place 1 spray into both nostrils 2 (two) times daily. 05/03/21   [provider]  ?furosemide (  LASIX) 40 MG tablet Take 1 tablet (40 mg total) by mouth daily. 05/29/21   Kathie Dike, MD  ?HYDROcodone-acetaminophen (NORCO) 7.5-325 MG tablet Take 1 tablet by mouth in the morning, at noon, in the evening, and at bedtime.    [provider]  ?INCRUSE ELLIPTA 62.5 MCG/ACT AEPB Inhale 1 puff into the lungs daily. 05/29/21   [provider]  ?ipratropium-albuterol (DUONEB) 0.5-2.5 (3) MG/3ML SOLN Take 3  mLs by nebulization every 6 (six) hours as needed (SOB).    [provider]  ?metoprolol succinate (TOPROL XL) 25 MG 24 hr tablet Take 0.5 tablets (12.5 mg total) by mouth daily. 07/06/21   Satira Sark, MD  ?Multiple Vitamin (MULTIVITAMIN WITH MINERALS) TABS tablet Take 1 tablet by mouth daily.    [provider]  ?nitrofurantoin, macrocrystal-monohydrate, (MACROBID) 100 MG capsule Take 1 capsule (100 mg total) by mouth every 12 (twelve) hours. ?Patient not taking: Reported on 06/21/2021 06/19/21   Primus Bravo., MD  ?nitrofurantoin, macrocrystal-monohydrate, (MACROBID) 100 MG capsule Take 1 capsule (100 mg total) by mouth at bedtime. To be started after completing 10 days of macrobid short term antibiotic ?Patient not taking: Reported on 06/21/2021 06/19/21   [place], Reece Leader., MD  ?OXYGEN Inhale 2 L into the lungs as needed (Shortness of Breath).    [provider]  ?pantoprazole (PROTONIX) 40 MG tablet Take 1 tablet (40 mg total) by mouth 2 (two) times daily. ?Patient taking differently: Take 40 mg by mouth daily. 09/23/20 09/23/21  Roxan Hockey, MD  ?polyethylene glycol (MIRALAX / GLYCOLAX) 17 g packet Take 17 g by mouth daily.    [provider]  ?SPIRIVA HANDIHALER 18 MCG inhalation capsule Place 1 capsule into inhaler and inhale daily. ?Patient not taking: Reported on 06/21/2021 05/03/21   [provider]  ?spironolactone (ALDACTONE) 25 MG tablet Take 0.5 tablets (12.5 mg total) by mouth daily. 05/30/21   Kathie Dike, MD  ?tamsulosin (FLOMAX) 0.4 MG CAPS capsule Take 1 capsule (0.4 mg total) by mouth daily after supper. 09/23/20   Roxan Hockey, MD  ?venlafaxine XR (EFFEXOR-XR) 150 MG 24 hr capsule Take 1 capsule (150 mg total) by mouth daily with breakfast. 07/18/20   [name], NP  ?                                                                                                                                  ?Past Surgical History ?Past  Surgical History:  ?Procedure Laterality Date  ? APPENDECTOMY    ? BACK SURGERY  08/2020  ? BIOPSY  09/20/2020  ? Procedure: BIOPSY;  Surgeon: Harvel Quale, MD;  [facility];  Service: Gastroenterology;;  ? BREAST SURGERY    ? CHOLECYSTECTOMY    ? COLONOSCOPY WITH PROPOFOL N/A 09/20/2020  ? Procedure: COLONOSCOPY WITH PROPOFOL;  Surgeon: Harvel Quale, MD;  [facility];  Service: Gastroenterology;  Laterality:  N/A;  ? ESOPHAGOGASTRODUODENOSCOPY (EGD) WITH PROPOFOL N/A 09/20/2020  ? Procedure: ESOPHAGOGASTRODUODENOSCOPY (EGD) WITH PROPOFOL;  Surgeon: Harvel Quale, MD;  [facility];  Service: Gastroenterology;  Laterality: N/A;  ? POLYPECTOMY  09/20/2020  ? Procedure: POLYPECTOMY;  Surgeon: Harvel Quale, MD;  [facility];  Service: Gastroenterology;;  ? ?Family History ?Family History  ?Problem Relation Age of Onset  ? Heart disease Father   ? COPD Brother   ? ? ?Social History ?Social History  ? ?Tobacco Use  ? Smoking status: Never  ? Smokeless tobacco: Never  ?Vaping Use  ? Vaping Use: Never used  ?Substance Use Topics  ? Alcohol use: Not Currently  ? Drug use: Never  ? ?Allergies ?Demerol [meperidine hcl] and Morphine and related ? ?Review of Systems ?Review of Systems  ?Cardiovascular:  Positive for leg swelling.  ?Neurological:  Positive for dizziness.  ? ?Physical Exam ?Vital Signs  ?I have reviewed the triage vital signs ?BP (!) 95/58   Pulse (!) 103   Temp 98.2 ?F (36.8 ?C) (Oral)   Resp 20   Ht 5' 5"  (1.651 m)   Wt 80.7 kg   SpO2 99%   BMI 29.62 kg/m?  ? ?Physical Exam ?Vitals and nursing note reviewed.  ?Constitutional:   ?   General: She is not in acute distress. ?   Appearance: She is well-developed.  ?HENT:  ?   Head: Normocephalic and atraumatic.  ?Eyes:  ?   Conjunctiva/sclera: Conjunctivae normal.  ?Cardiovascular:  ?   Rate and Rhythm: Normal rate and regular rhythm.  ?   Heart sounds: No murmur  heard. ?Pulmonary:  ?   Effort: Pulmonary effort is normal. No respiratory distress.  ?   Breath sounds: Normal breath sounds.  ?Abdominal:  ?   Palpations: Abdomen is soft.  ?   Tenderness: There is no abdominal tenderness

## 2021-07-07 NOTE — ED Notes (Signed)
Pt given ice chips per Dr Matilde Sprang ok ?

## 2021-07-08 DIAGNOSIS — R531 Weakness: Secondary | ICD-10-CM | POA: Diagnosis not present

## 2021-07-08 DIAGNOSIS — Z7401 Bed confinement status: Secondary | ICD-10-CM | POA: Diagnosis not present

## 2021-07-08 DIAGNOSIS — R5381 Other malaise: Secondary | ICD-10-CM | POA: Diagnosis not present

## 2021-07-10 ENCOUNTER — Encounter (HOSPITAL_COMMUNITY): Payer: Self-pay | Admitting: Cardiology

## 2021-07-13 DIAGNOSIS — Z20822 Contact with and (suspected) exposure to covid-19: Secondary | ICD-10-CM | POA: Diagnosis not present

## 2021-07-17 ENCOUNTER — Encounter: Payer: Self-pay | Admitting: Orthopaedic Surgery

## 2021-07-17 ENCOUNTER — Ambulatory Visit (INDEPENDENT_AMBULATORY_CARE_PROVIDER_SITE_OTHER): Payer: Medicare (Managed Care) | Admitting: Orthopaedic Surgery

## 2021-07-17 ENCOUNTER — Ambulatory Visit: Payer: Medicare (Managed Care)

## 2021-07-17 ENCOUNTER — Ambulatory Visit (INDEPENDENT_AMBULATORY_CARE_PROVIDER_SITE_OTHER): Payer: Medicare (Managed Care)

## 2021-07-17 VITALS — BP 116/57 | HR 59 | Ht 66.0 in | Wt 178.0 lb

## 2021-07-17 DIAGNOSIS — M25562 Pain in left knee: Secondary | ICD-10-CM | POA: Diagnosis not present

## 2021-07-17 DIAGNOSIS — G8929 Other chronic pain: Secondary | ICD-10-CM

## 2021-07-17 DIAGNOSIS — M25561 Pain in right knee: Secondary | ICD-10-CM | POA: Diagnosis not present

## 2021-07-17 DIAGNOSIS — M1711 Unilateral primary osteoarthritis, right knee: Secondary | ICD-10-CM

## 2021-07-17 DIAGNOSIS — M1712 Unilateral primary osteoarthritis, left knee: Secondary | ICD-10-CM

## 2021-07-17 NOTE — Progress Notes (Signed)
Subjective:    Patient ID: Krystal Ali, female    DOB: 1945/06/17, 76 y.o.   MRN: [id number]  HPI She is a patient at a local nursing home.  She has had bilateral knee pain, more on the right, for many years.  Six years ago she had back surgery and then had problems with use of the right leg. She has not been able to walk since then.  She has sensation of the right leg.  The right knee is fixed in extension.  She cannot bend it.  She gets help to pivot from bed to wheelchair.  She is confined to a wheelchair.  She has no swelling of the right knee, no redness.  She has no trauma.  The right knee has been more painful over the last year.  She does not know why she has waited so long to have it checked.  The left knee has some pain, popping and some swelling. She is able to bend it. She has no redness, no trauma.  I have no records from the nursing home to review.  She had an xray of the right knee at the nursing home, but I have nothing to review.  I will get X-rays today.  She is on an anticoagulation program.  She says it is for her heart for atrial fib.   Review of Systems  Constitutional:  Positive for activity change.  Cardiovascular:  Positive for palpitations.  Musculoskeletal:  Positive for arthralgias, back pain and gait problem.  All other systems reviewed and are negative. For Review of Systems, all other systems reviewed and are negative.  The following is a summary of the past history medically, past history surgically, known current medicines, social history and family history.  This information is gathered electronically by the computer from prior information and documentation.  I review this each visit and have found including this information at this point in the chart is beneficial and informative.   Past Medical History:  Diagnosis Date   Anxiety    Atrial fibrillation ([facility])    Diabetes mellitus without complication ([facility])    Dysrhythmia    GERD  (gastroesophageal reflux disease)    Hyperlipidemia    Hypertension    Retention of urine, unspecified    Sleep apnea    Spinal stenosis of lumbar region without neurogenic claudication     Past Surgical History:  Procedure Laterality Date   APPENDECTOMY     BACK SURGERY  08/2020   BIOPSY  09/20/2020   Procedure: BIOPSY;  Surgeon: Dolores Frame, MD;  [facility];  Service: Gastroenterology;;   BREAST SURGERY     CARDIOVERSION N/A 07/06/2021   Procedure: CARDIOVERSION;  Surgeon: Jonelle Sidle, MD;  Location: [facility];  Service: Cardiovascular;  Laterality: N/A;   CHOLECYSTECTOMY     COLONOSCOPY WITH PROPOFOL N/A 09/20/2020   Procedure: COLONOSCOPY WITH PROPOFOL;  Surgeon: Dolores Frame, MD;  [facility];  Service: Gastroenterology;  Laterality: N/A;   ESOPHAGOGASTRODUODENOSCOPY (EGD) WITH PROPOFOL N/A 09/20/2020   Procedure: ESOPHAGOGASTRODUODENOSCOPY (EGD) WITH PROPOFOL;  Surgeon: Dolores Frame, MD;  [facility];  Service: Gastroenterology;  Laterality: N/A;   POLYPECTOMY  09/20/2020   Procedure: POLYPECTOMY;  Surgeon: Dolores Frame, MD;  [facility];  Service: Gastroenterology;;    Current Outpatient Medications on File Prior to Visit  Medication Sig Dispense Refill   acetaminophen (TYLENOL) 325 MG tablet Take 2 tablets (650 mg  total) by mouth every 6 (six) hours as needed for mild pain (or Fever >/= 101). (Patient taking differently: Take 650 mg by mouth every 6 (six) hours as needed for mild pain (or Fever >/= 101). Do not exceed 3,000 mg in 24 hrs) 12 tablet 0   amiodarone (PACERONE) 200 MG tablet Take 200 mg by mouth daily.     apixaban (ELIQUIS) 5 MG TABS tablet Take 1 tablet (5 mg total) by mouth 2 (two) times daily. 60 tablet    blood glucose meter kit and supplies Dispense based on patient and insurance preference. Use up to four times daily as directed. (FOR ICD-10 E10.9, E11.9). 1  each 0   ciprofloxacin (CIPRO) 500 MG tablet Take 1 tablet (500 mg total) by mouth every 12 (twelve) hours. 10 tablet 0   diclofenac Sodium (VOLTAREN) 1 % GEL Apply 2 g topically in the morning and at bedtime.     ferrous sulfate 325 (65 FE) MG tablet Take 325 mg by mouth daily with breakfast.     fluticasone (FLONASE) 50 MCG/ACT nasal spray Place 1 spray into both nostrils 2 (two) times daily.     furosemide (LASIX) 40 MG tablet Take 1 tablet (40 mg total) by mouth daily. 30 tablet    HYDROcodone-acetaminophen (NORCO) 7.5-325 MG tablet Take 1 tablet by mouth in the morning, at noon, in the evening, and at bedtime.     INCRUSE ELLIPTA 62.5 MCG/ACT AEPB Inhale 1 puff into the lungs daily.     ipratropium-albuterol (DUONEB) 0.5-2.5 (3) MG/3ML SOLN Take 3 mLs by nebulization every 6 (six) hours as needed (SOB).     metoprolol succinate (TOPROL XL) 25 MG 24 hr tablet Take 0.5 tablets (12.5 mg total) by mouth daily. 135 tablet 3   Multiple Vitamin (MULTIVITAMIN WITH MINERALS) TABS tablet Take 1 tablet by mouth daily.     nitrofurantoin, macrocrystal-monohydrate, (MACROBID) 100 MG capsule Take 1 capsule (100 mg total) by mouth every 12 (twelve) hours. 20 capsule 0   nitrofurantoin, macrocrystal-monohydrate, (MACROBID) 100 MG capsule Take 1 capsule (100 mg total) by mouth at bedtime. To be started after completing 10 days of macrobid short term antibiotic 30 capsule 2   OXYGEN Inhale 2 L into the lungs as needed (Shortness of Breath).     pantoprazole (PROTONIX) 40 MG tablet Take 1 tablet (40 mg total) by mouth 2 (two) times daily. (Patient taking differently: Take 40 mg by mouth daily.) 60 tablet 2   polyethylene glycol (MIRALAX / GLYCOLAX) 17 g packet Take 17 g by mouth daily.     SPIRIVA HANDIHALER 18 MCG inhalation capsule Place 1 capsule into inhaler and inhale daily.     spironolactone (ALDACTONE) 25 MG tablet Take 0.5 tablets (12.5 mg total) by mouth daily.     tamsulosin (FLOMAX) 0.4 MG CAPS  capsule Take 1 capsule (0.4 mg total) by mouth daily after supper. 30 capsule 0   venlafaxine XR (EFFEXOR-XR) 150 MG 24 hr capsule Take 1 capsule (150 mg total) by mouth daily with breakfast. 90 capsule 1   No current facility-administered medications on file prior to visit.    Social History   Socioeconomic History   Marital status: Divorced    Spouse name: Not on file   Number of children: Not on file   Years of education: Not on file   Highest education level: Not on file  Occupational History   Not on file  Tobacco Use   Smoking status: Never   Smokeless  tobacco: Never  Vaping Use   Vaping Use: Never used  Substance and Sexual Activity   Alcohol use: Not Currently   Drug use: Never   Sexual activity: Not Currently  Other Topics Concern   Not on file  Social History Narrative   Lives at [facility]   Social Determinants of Health   Financial Resource Strain: Not on file  Food Insecurity: Not on file  Transportation Needs: Not on file  Physical Activity: Not on file  Stress: Not on file  Social Connections: Not on file  Intimate Partner Violence: Not on file    Family History  Problem Relation Age of Onset   Heart disease Father    COPD Brother     BP (!) 116/57   Pulse (!) 59   Ht 5\' 6"  (1.676 m)   Wt 178 lb (80.7 kg)   BMI 28.73 kg/m   Body mass index is 28.73 kg/m.     Objective:   Physical Exam Vitals and nursing note reviewed. Exam conducted with a chaperone present.  Constitutional:      Appearance: She is well-developed.  HENT:     Head: Normocephalic and atraumatic.  Eyes:     Conjunctiva/sclera: Conjunctivae normal.     Pupils: Pupils are equal, round, and reactive to light.  Cardiovascular:     Rate and Rhythm: Normal rate and regular rhythm.  Pulmonary:     Effort: Pulmonary effort is normal.  Abdominal:     Palpations: Abdomen is soft.  Musculoskeletal:     Cervical back: Normal range of motion and neck supple.        Legs:  Skin:    General: Skin is warm and dry.  Neurological:     Mental Status: She is alert and oriented to person, place, and time.     Cranial Nerves: No cranial nerve deficit.     Motor: No abnormal muscle tone.     Coordination: Coordination normal.     Deep Tendon Reflexes: Reflexes are normal and symmetric. Reflexes normal.  Psychiatric:        Behavior: Behavior normal.        Thought Content: Thought content normal.        Judgment: Judgment normal.  X-rays were done of both knees, reported separately.         Assessment & Plan:   Encounter Diagnosis  Name Primary?   Chronic pain of both knees Yes   She has marked DJD of both knees.  PROCEDURE NOTE:  The patient requests injections of the left knee , verbal consent was obtained.  The left knee was prepped appropriately after time out was performed.   Sterile technique was observed and injection of 1 cc of DepoMedrol 40 mg with several cc's of plain xylocaine. Anesthesia was provided by ethyl chloride and a 20-gauge needle was used to inject the knee area. The injection was tolerated well.  A band aid dressing was applied.  The patient was advised to apply ice later today and tomorrow to the injection sight as needed.  PROCEDURE NOTE:  The patient requests injections of the right knee , verbal consent was obtained.  The right knee was prepped appropriately after time out was performed.   Sterile technique was observed and injection of 1 cc of DepoMedrol 40mg  with several cc's of plain xylocaine. Anesthesia was provided by ethyl chloride and a 20-gauge needle was used to inject the knee area. The injection was tolerated well.  A band  aid dressing was applied.  The patient was advised to apply ice later today and tomorrow to the injection sight as needed.  The injections may or may not help.  Return in one month.  Call if any problem.  Precautions discussed.  Electronically Signed Darreld Mclean,  MD 5/9/20238:40 AM

## 2021-07-31 ENCOUNTER — Ambulatory Visit: Payer: Medicare (Managed Care) | Admitting: Urology

## 2021-07-31 NOTE — Progress Notes (Unsigned)
Assessment: 1. Frequent UTI   2. Gross hematuria   3. Incomplete bladder emptying   4. Nephrolithiasis      Plan: Continue Flomax 0.4 mg daily. May need to consider beginning in and out catheterization twice daily-3 times daily to keep bladder empty and decrease risk of recurrent UTIs. Recommend GYN evaluation for thickened endometrium seen on recent CT scan as well as CT from 7/22. We will contact with results and to arrange next visit.  Chief Complaint:  No chief complaint on file.   History of Present Illness:  Krystal Ali is a 76 y.o. year old female who is seen for further evaluation of recurrent UTIs and history of gross hematuria.  She was initially seen for the same in September 2022.    History:  She presented to the emergency room on 09/08/2020 with gross hematuria.  Urinalysis demonstrated >50 RBCs, >50 WBCs, and many bacteria.  Urine culture grew >100 K. Proteus and >100 K E. coli.  She presented to the emergency room again on 09/21/2020 with rectal bleeding and gross hematuria as well as difficulty urinating.  She reported gross painless hematuria for several weeks.  She was also having associated urgency, frequency, and dysuria.  A Foley catheter was placed at that time.  CT imaging showed a thickened bladder wall and thickened right renal pelvis consistent with infection or possible malignancy.  She also had changes noted to the uterus and cervix.  She was discharged from the hospital with plans for urologic follow-up.  She did not follow-up until September 2022.  At that time, she continued with a Foley catheter.  She denied any problems voiding prior to her hospitalization.   She was taking tamsulosin 0.4 mg daily and oxybutynin ER 5 mg daily.  She was treated with Macrobid x7 days from 11/10/2020 to 11/17/2020. Her Foley catheter was removed and the oxybutynin was discontinued.  She was scheduled for further evaluation with cystoscopy but had not returned. She also needed  GYN follow-up for the abnormalities noted on vaginal ultrasound.  Urinalysis from 04/14/2021 showed 20-30 WBCs and 3+ bacteria.  Urine culture grew >100 K mixed flora. Urinalysis from 04/25/2021 showed 11-20 RBCs, 51-100 WBCs, and 3+ bacteria.  Urine culture grew >100 K ESBL E. coli. She has had symptoms of dysuria, increased frequency and nausea associated with UTIs.  She has not had any further gross hematuria.  She was recently on IV ertapenem for treatment of her most recent UTI.  She began the IV antibiotics on 04/30/2021.  She noted some improvement in her urinary symptoms with the antibiotic therapy. Urine culture from 05/21/2021 grew +100 K gram-negative rods, ESBL, and 50 K Enterococcus.  She was treated with IV meropenem.  CT hematuria protocol from 06/07/2021 demonstrated improvement in the wall thickening of the right renal pelvis and bladder without focal urothelial lesions or obstruction, new 7 mm calcification within the right renal pelvis, persistent abnormal thickening of the endometrium, and stable right adrenal adenoma. Cystoscopy from 4/23 showed trabeculations, cellules, multiple areas of mucosal erythema consistent with chronic cystitis, no papillary lesions, and debris at bladder base. Urine cytology was negative for malignancy. Urine culture grew >100 K ESBL E. coli.  She was treated with Cipro and Macrobid.  She was then started on Macrobid daily for UTI prevention.  Portions of the above documentation were copied from a prior visit for review purposes only.  Past Medical History:  Diagnosis Date   Anxiety    Atrial fibrillation ([place])  Diabetes mellitus without complication ([place])    Dysrhythmia    GERD (gastroesophageal reflux disease)    Hyperlipidemia    Hypertension    Retention of urine, unspecified    Sleep apnea    Spinal stenosis of lumbar region without neurogenic claudication     Past Surgical History:  Procedure Laterality Date   APPENDECTOMY     BACK  SURGERY  08/2020   BIOPSY  09/20/2020   Procedure: BIOPSY;  Surgeon: Harvel Quale, MD;  [facility];  Service: Gastroenterology;;   BREAST SURGERY     CARDIOVERSION N/A 07/06/2021   Procedure: CARDIOVERSION;  Surgeon: Satira Sark, MD;  Location: [facility];  Service: Cardiovascular;  Laterality: N/A;   CHOLECYSTECTOMY     COLONOSCOPY WITH PROPOFOL N/A 09/20/2020   Procedure: COLONOSCOPY WITH PROPOFOL;  Surgeon: Harvel Quale, MD;  [facility];  Service: Gastroenterology;  Laterality: N/A;   ESOPHAGOGASTRODUODENOSCOPY (EGD) WITH PROPOFOL N/A 09/20/2020   Procedure: ESOPHAGOGASTRODUODENOSCOPY (EGD) WITH PROPOFOL;  Surgeon: Harvel Quale, MD;  [facility];  Service: Gastroenterology;  Laterality: N/A;   POLYPECTOMY  09/20/2020   Procedure: POLYPECTOMY;  Surgeon: Montez Morita, Quillian Quince, MD;  [facility];  Service: Gastroenterology;;     Allergies  Allergen Reactions   Demerol [Meperidine Hcl]     Agitation   Morphine And Related     No reaction listed on nursing home record     Family History  Problem Relation Age of Onset   Heart disease Father    COPD Brother     Social History   Tobacco Use   Smoking status: Never   Smokeless tobacco: Never  Vaping Use   Vaping Use: Never used  Substance Use Topics   Alcohol use: Not Currently   Drug use: Never    ROS: Constitutional:  Negative for fever, chills, weight loss CV: Negative for chest pain, previous MI, hypertension Respiratory:  Negative for shortness of breath, wheezing, sleep apnea, frequent cough GI:  Negative for nausea, vomiting, bloody stool, GERD  Physical exam: There were no vitals taken for this visit. GENERAL APPEARANCE:  Well appearing, well developed, well nourished, NAD HEENT:  Atraumatic, normocephalic, oropharynx clear NECK:  Supple without lymphadenopathy or thyromegaly ABDOMEN:  Soft, non-tender, no  masses EXTREMITIES:  Moves all extremities well, without clubbing, cyanosis, or edema NEUROLOGIC:  Alert and oriented x 3, normal gait, CN II-XII grossly intact MENTAL STATUS:  appropriate BACK:  Non-tender to palpation, No CVAT SKIN:  Warm, dry, and intact   Results: U/A:

## 2021-08-09 ENCOUNTER — Encounter: Payer: Self-pay | Admitting: Student

## 2021-08-09 ENCOUNTER — Ambulatory Visit (INDEPENDENT_AMBULATORY_CARE_PROVIDER_SITE_OTHER): Payer: Medicare (Managed Care) | Admitting: Student

## 2021-08-09 VITALS — BP 128/56 | HR 79 | Ht 65.0 in | Wt 170.0 lb

## 2021-08-09 DIAGNOSIS — I10 Essential (primary) hypertension: Secondary | ICD-10-CM | POA: Diagnosis not present

## 2021-08-09 DIAGNOSIS — I5020 Unspecified systolic (congestive) heart failure: Secondary | ICD-10-CM | POA: Diagnosis not present

## 2021-08-09 DIAGNOSIS — Z7901 Long term (current) use of anticoagulants: Secondary | ICD-10-CM | POA: Diagnosis not present

## 2021-08-09 DIAGNOSIS — I4819 Other persistent atrial fibrillation: Secondary | ICD-10-CM | POA: Diagnosis not present

## 2021-08-09 NOTE — Progress Notes (Signed)
Cardiology Office Note    Date:  08/09/2021   ID:  NARDOS PUTNAM, DOB 03-08-1946, MRN [id number]  PCP:  Hal Morales, DO  Cardiologist: Freada Bergeron, MD    Chief Complaint  Patient presents with   Follow-up    Recent DCCV    History of Present Illness:    Krystal Ali is a 76 y.o. female with past medical history of persistent atrial fibrillation (diagnosed in 05/2021), HFrEF (EF 20-25% by echo in 05/2021), HTN and prediabetes who presents to the office today for follow-up from her recent DCCV.  She was examined by myself in 06/2021 for hospital follow-up and reported that her breathing had been stable and denied any specific orthopnea, PND or pitting edema. It had previously been recommended to consider DCCV following 3 weeks of anticoagulation and given that she was still in atrial fibrillation by repeat EKG, risk and benefits of this were reviewed with DCCV arranged. She was continued on Amiodarone 200 mg daily and Toprol-XL was titrated from 25 mg daily to 37.5 mg daily given her heart rate in the low 100's. She was continued on Spironolactone 12.5 mg daily and Lasix 40 mg daily. She was not started on an SGLT2 inhibitor given frequent UTIs in her wheelchair/bedbound status and BP did not allow for starting an ARB.   She underwent DCCV by Dr. Domenic Polite on 07/06/2021 and converted to normal sinus rhythm with a single 150 J shock and Toprol-XL was reduced to 12.5 mg daily.  It appears she presented back to [facility] ED the following day as she had an episode of transient dizziness and hypotension while at [facility]. She felt back to baseline upon arrival to the ED but was hypotensive, therefore she received a fluid bolus with improvement in BP readings. She had recently been started on Oxycodone and it was felt this was contributing to her hypotension. By review of the ED notes, she did have episodes of atrial fibrillation on telemetry but EKG at that time showed normal sinus  rhythm.  In talking with the patient and one of her caregivers from SNF today, she reports she has been having episodes of a cramping chest pain but this lasts for hours at a time and is not associated with exertion. She is working with PT and says she has been lifting arm weights and questions if this is contributed to her symptoms. Reports that her breathing has improved and she denies any palpitations.  No specific orthopnea, PND or pitting edema.   Past Medical History:  Diagnosis Date   Anxiety    Atrial fibrillation ([place])    Diabetes mellitus without complication ([place])    Dysrhythmia    GERD (gastroesophageal reflux disease)    Hyperlipidemia    Hypertension    Retention of urine, unspecified    Sleep apnea    Spinal stenosis of lumbar region without neurogenic claudication     Past Surgical History:  Procedure Laterality Date   APPENDECTOMY     BACK SURGERY  08/2020   BIOPSY  09/20/2020   Procedure: BIOPSY;  Surgeon: Harvel Quale, MD;  [facility];  Service: Gastroenterology;;   BREAST SURGERY     CARDIOVERSION N/A 07/06/2021   Procedure: CARDIOVERSION;  Surgeon: Satira Sark, MD;  [facility];  Service: Cardiovascular;  Laterality: N/A;   CHOLECYSTECTOMY     COLONOSCOPY WITH PROPOFOL N/A 09/20/2020   Procedure: COLONOSCOPY WITH PROPOFOL;  Surgeon: Harvel Quale, MD;  [facility];  Service: Gastroenterology;  Laterality: N/A;   ESOPHAGOGASTRODUODENOSCOPY (EGD) WITH PROPOFOL N/A 09/20/2020   Procedure: ESOPHAGOGASTRODUODENOSCOPY (EGD) WITH PROPOFOL;  Surgeon: Harvel Quale, MD;  [facility];  Service: Gastroenterology;  Laterality: N/A;   POLYPECTOMY  09/20/2020   Procedure: POLYPECTOMY;  Surgeon: Montez Morita, Quillian Quince, MD;  [facility];  Service: Gastroenterology;;    Current Medications: Outpatient Medications Prior to Visit  Medication Sig Dispense Refill   acetaminophen  (TYLENOL) 325 MG tablet Take 2 tablets (650 mg total) by mouth every 6 (six) hours as needed for mild pain (or Fever >/= 101). (Patient taking differently: Take 650 mg by mouth every 6 (six) hours as needed for mild pain (or Fever >/= 101). Do not exceed 3,000 mg in 24 hrs) 12 tablet 0   amiodarone (PACERONE) 200 MG tablet Take 200 mg by mouth daily.     apixaban (ELIQUIS) 5 MG TABS tablet Take 1 tablet (5 mg total) by mouth 2 (two) times daily. 60 tablet    diclofenac Sodium (VOLTAREN) 1 % GEL Apply 2 g topically in the morning and at bedtime.     ferrous sulfate 325 (65 FE) MG tablet Take 325 mg by mouth daily with breakfast.     fluticasone (FLONASE) 50 MCG/ACT nasal spray Place 1 spray into both nostrils 2 (two) times daily.     furosemide (LASIX) 40 MG tablet Take 1 tablet (40 mg total) by mouth daily. 30 tablet    HYDROcodone-acetaminophen (NORCO) 7.5-325 MG tablet Take 1 tablet by mouth in the morning, at noon, in the evening, and at bedtime.     INCRUSE ELLIPTA 62.5 MCG/ACT AEPB Inhale 1 puff into the lungs daily.     ipratropium-albuterol (DUONEB) 0.5-2.5 (3) MG/3ML SOLN Take 3 mLs by nebulization every 6 (six) hours as needed (SOB).     metoprolol succinate (TOPROL XL) 25 MG 24 hr tablet Take 0.5 tablets (12.5 mg total) by mouth daily. 135 tablet 3   Multiple Vitamin (MULTIVITAMIN WITH MINERALS) TABS tablet Take 1 tablet by mouth daily.     nitrofurantoin, macrocrystal-monohydrate, (MACROBID) 100 MG capsule Take 1 capsule (100 mg total) by mouth every 12 (twelve) hours. 20 capsule 0   OXYGEN Inhale 2 L into the lungs as needed (Shortness of Breath).     pantoprazole (PROTONIX) 40 MG tablet Take 1 tablet (40 mg total) by mouth 2 (two) times daily. (Patient taking differently: Take 40 mg by mouth daily.) 60 tablet 2   polyethylene glycol (MIRALAX / GLYCOLAX) 17 g packet Take 17 g by mouth daily.     SPIRIVA HANDIHALER 18 MCG inhalation capsule Place 1 capsule into inhaler and inhale daily.      spironolactone (ALDACTONE) 25 MG tablet Take 0.5 tablets (12.5 mg total) by mouth daily.     tamsulosin (FLOMAX) 0.4 MG CAPS capsule Take 1 capsule (0.4 mg total) by mouth daily after supper. 30 capsule 0   venlafaxine XR (EFFEXOR-XR) 150 MG 24 hr capsule Take 1 capsule (150 mg total) by mouth daily with breakfast. 90 capsule 1   blood glucose meter kit and supplies Dispense based on patient and insurance preference. Use up to four times daily as directed. (FOR ICD-10 E10.9, E11.9). 1 each 0   ciprofloxacin (CIPRO) 500 MG tablet Take 1 tablet (500 mg total) by mouth every 12 (twelve) hours. 10 tablet 0   nitrofurantoin, macrocrystal-monohydrate, (MACROBID) 100 MG capsule Take 1 capsule (100 mg total) by mouth at  bedtime. To be started after completing 10 days of macrobid short term antibiotic 30 capsule 2   No facility-administered medications prior to visit.     Allergies:   Demerol [meperidine hcl] and Morphine and related   Social History   Socioeconomic History   Marital status: Divorced    Spouse name: Not on file   Number of children: Not on file   Years of education: Not on file   Highest education level: Not on file  Occupational History   Not on file  Tobacco Use   Smoking status: Never   Smokeless tobacco: Never  Vaping Use   Vaping Use: Never used  Substance and Sexual Activity   Alcohol use: Not Currently   Drug use: Never   Sexual activity: Not Currently  Other Topics Concern   Not on file  Social History Narrative   Lives at [facility]   Financial Resource Strain: Not on file  Food Insecurity: Not on file  Transportation Needs: Not on file  Physical Activity: Not on file  Stress: Not on file  Social Connections: Not on file     Family History:  The patient's family history includes COPD in her brother; Heart disease in her father.   Review of Systems:    Please see the history of present illness.     All other systems  reviewed and are otherwise negative except as noted above.   Physical Exam:    VS:  BP (!) 128/56   Pulse 79   Ht 5' 5"  (1.651 m)   Wt 170 lb (77.1 kg)   SpO2 95%   BMI 28.29 kg/m    General: Pleasant elderly female appearing in no acute distress. Sitting in wheelchair.  Head: Normocephalic, atraumatic. Neck: No carotid bruits. JVD not elevated.  Lungs: Respirations regular and unlabored, without wheezes or rales.  Heart: Regular rate and rhythm. No S3 or S4.  No murmur, no rubs, or gallops appreciated. Abdomen: Appears non-distended. No obvious abdominal masses. Msk:  Strength and tone appear normal for age. No obvious joint deformities or effusions. Extremities: No clubbing or cyanosis. No pitting edema.  Distal pedal pulses are 2+ bilaterally. Neuro: Alert and oriented X 3. Moves all extremities spontaneously. No focal deficits noted. Psych:  Responds to questions appropriately with a normal affect. Skin: No rashes or lesions noted  Wt Readings from Last 3 Encounters:  08/09/21 170 lb (77.1 kg)  07/17/21 178 lb (80.7 kg)  07/07/21 178 lb (80.7 kg)     Studies/Labs Reviewed:   EKG:  EKG is ordered today.  The ekg ordered today demonstrates NSR, HR 76 with IVCD but no acute ST changes.  Recent Labs: 05/21/2021: TSH 2.946 05/22/2021: B Natriuretic Peptide 269.0 05/24/2021: Magnesium 1.9 07/07/2021: ALT 15; BUN 46; Creatinine, Ser 1.15; Hemoglobin 9.7; Platelets 170; Potassium 3.8; Sodium 135   Lipid Panel No results found for: CHOL, TRIG, HDL, CHOLHDL, VLDL, LDLCALC, LDLDIRECT  Additional studies/ records that were reviewed today include:   Echocardiogram: 05/22/2021 IMPRESSIONS     1. Left ventricular ejection fraction, by estimation, is 20 to 25%. The  left ventricle has severely decreased function. The left ventricle  demonstrates global hypokinesis. Diastolic function indeterminant due to  Afib. Elevated left atrial pressure.   2. Right ventricular systolic  function mildly-to-moderately reduced. The  right ventricular size is normal. There is moderately elevated pulmonary  artery systolic pressure. The estimated right ventricular systolic  pressure is 34.7 mmHg.  3. Left atrial size was moderately dilated.   4. Right atrial size was moderately dilated.   5. The mitral valve is abnormal. Mild mitral valve regurgitation.   6. Tricuspid valve regurgitation is mild to moderate.   7. The aortic valve is tricuspid. There is mild calcification of the  aortic valve. There is mild thickening of the aortic valve. Aortic valve  regurgitation is not visualized. Aortic valve sclerosis/calcification is  present, without any evidence of  aortic stenosis.   8. The inferior vena cava is dilated in size with <50% respiratory  variability, suggesting right atrial pressure of 15 mmHg.   Comparison(s): No prior Echocardiogram.   Assessment:    1. Persistent atrial fibrillation ([place])   2. Current use of long term anticoagulation   3. HFrEF (heart failure with reduced ejection fraction) ([place])   4. Essential hypertension      Plan:   In order of problems listed above:  1. Persistent Atrial Fibrillation/Use of Anticoagulation - She underwent successful DCCV in 06/2021 and is maintaining NSR by examination and EKG today. Will continue Amiodarone 269m daily and Toprol-XL 12.557mdaily. Would plan to recheck TSH and LFT's at her next visit if not checked by her PCP in the interim.  - No reports of active bleeding. She remains on Eliquis 5m8mID for anticoagulation which is the appropriate dose at this time. Labs in 06/2021 showed her Hgb was at 9.7 with platelets at 170 K.   2. HFrEF - Her EF was at 20-25% in 05/2021 and felt to likely be tachycardia-mediated in the setting of atrial fibrillation with RVR. Her breathing has been stable and she does not appear volume overloaded on examination today.  - She remains on Toprol-XL 12.5mg103mily, Lasix 40mg54mly  and Spironolactone 12.5mg d98my. I am hesitant to add an ARB at this time given intermittent hypotension at SNF but would consider in the future if BP stabilizes. Doubt BP would allow for Entresto and she has not been on an SGLT2 inhibitor given her wheel-chair/bed-bound status and high-risk of infections. Will plan for a repeat limited echo next month for reassessment of her EF. If this has not started to improve, can consider ischemic evaluation and add an ARB if BP allows.  3. HTN - Her BP is at 128/56 today but she has experienced episodes of hypotension at SNF by review of notes. Continue current medical therapy for now given no recent recurrences.     Medication Adjustments/Labs and Tests Ordered: Current medicines are reviewed at length with the patient today.  Concerns regarding medicines are outlined above.  Medication changes, Labs and Tests ordered today are listed in the Patient Instructions below. Patient Instructions  Medication Instructions:  Your physician recommends that you continue on your current medications as directed. Please refer to the Current Medication list given to you today.  *If you need a refill on your cardiac medications before your next appointment, please call your pharmacy*   Lab Work: NONE   If you have labs (blood work) drawn today and your tests are completely normal, you will receive your results only by: [facility] have MyChart) OR A paper copy in the mail If you have any lab test that is abnormal or we need to change your treatment, we will call you to review the results.   Testing/Procedures: Your physician has requested that you have an echocardiogram. Echocardiography is a painless test that uses sound waves to create images of your heart.  It provides your doctor with information about the size and shape of your heart and how well your heart's chambers and valves are working. This procedure takes approximately one hour. There are no  restrictions for this procedure.    Follow-Up: At [facility], you and your health needs are our priority.  As part of our continuing mission to provide you with exceptional heart care, we have created designated Provider Care Teams.  These Care Teams include your primary Cardiologist (physician) and Advanced Practice Providers (APPs -  Physician Assistants and Nurse Practitioners) who all work together to provide you with the care you need, when you need it.  We recommend signing up for the patient portal called "MyChart".  Sign up information is provided on this After Visit Summary.  MyChart is used to connect with patients for Virtual Visits (Telemedicine).  Patients are able to view lab/test results, encounter notes, upcoming appointments, etc.  Non-urgent messages can be sent to your provider as well.   To learn more about what you can do with MyChart, go to [email or web address].    Your next appointment:   2-3 month(s)  The format for your next appointment:   In Person  Provider:   You may see Freada Bergeron, MD or one of the following Advanced Practice Providers on your designated Care Team:   Bernerd Pho, PA-C  Ermalinda Barrios, PA-C     Other Instructions Thank you for choosing [facility]!    Important Information About Sugar         Signed, Erma Heritage, PA-C  08/09/2021 7:39 PM    [facility] [address] [place], [place] [address] Phone: [phone] Fax: [phone]

## 2021-08-09 NOTE — Patient Instructions (Signed)
Medication Instructions:  Your physician recommends that you continue on your current medications as directed. Please refer to the Current Medication list given to you today.  *If you need a refill on your cardiac medications before your next appointment, please call your pharmacy*   Lab Work: NONE   If you have labs (blood work) drawn today and your tests are completely normal, you will receive your results only by: [place] (if you have MyChart) OR A paper copy in the mail If you have any lab test that is abnormal or we need to change your treatment, we will call you to review the results.   Testing/Procedures: Your physician has requested that you have an echocardiogram. Echocardiography is a painless test that uses sound waves to create images of your heart. It provides your doctor with information about the size and shape of your heart and how well your heart's chambers and valves are working. This procedure takes approximately one hour. There are no restrictions for this procedure.    Follow-Up: At [facility], you and your health needs are our priority.  As part of our continuing mission to provide you with exceptional heart care, we have created designated Provider Care Teams.  These Care Teams include your primary Cardiologist (physician) and Advanced Practice Providers (APPs -  Physician Assistants and Nurse Practitioners) who all work together to provide you with the care you need, when you need it.  We recommend signing up for the patient portal called "MyChart".  Sign up information is provided on this After Visit Summary.  MyChart is used to connect with patients for Virtual Visits (Telemedicine).  Patients are able to view lab/test results, encounter notes, upcoming appointments, etc.  Non-urgent messages can be sent to your provider as well.   To learn more about what you can do with MyChart, go to [email or web address].    Your next appointment:   2-3  month(s)  The format for your next appointment:   In Person  Provider:   You may see Freada Bergeron, MD or one of the following Advanced Practice Providers on your designated Care Team:   Bernerd Pho, PA-C  Ermalinda Barrios, PA-C     Other Instructions Thank you for choosing [facility]!    Important Information About Sugar

## 2021-08-14 ENCOUNTER — Ambulatory Visit (INDEPENDENT_AMBULATORY_CARE_PROVIDER_SITE_OTHER): Payer: Medicare (Managed Care) | Admitting: Orthopaedic Surgery

## 2021-08-14 ENCOUNTER — Encounter: Payer: Self-pay | Admitting: Orthopaedic Surgery

## 2021-08-14 DIAGNOSIS — Z7901 Long term (current) use of anticoagulants: Secondary | ICD-10-CM

## 2021-08-14 DIAGNOSIS — M25561 Pain in right knee: Secondary | ICD-10-CM | POA: Diagnosis not present

## 2021-08-14 DIAGNOSIS — G8929 Other chronic pain: Secondary | ICD-10-CM | POA: Diagnosis not present

## 2021-08-14 DIAGNOSIS — M25562 Pain in left knee: Secondary | ICD-10-CM | POA: Diagnosis not present

## 2021-08-14 NOTE — Progress Notes (Signed)
PROCEDURE NOTE:  The patient requests injections of the left knee , verbal consent was obtained.  The left knee was prepped appropriately after time out was performed.   Sterile technique was observed and injection of 1 cc of DepoMedrol 40 mg with several cc's of plain xylocaine. Anesthesia was provided by ethyl chloride and a 20-gauge needle was used to inject the knee area. The injection was tolerated well.  A band aid dressing was applied.  The patient was advised to apply ice later today and tomorrow to the injection sight as needed.  PROCEDURE NOTE:  The patient requests injections of the right knee , verbal consent was obtained.  The right knee was prepped appropriately after time out was performed.   Sterile technique was observed and injection of 1 cc of DepoMedrol '40mg'$  with several cc's of plain xylocaine. Anesthesia was provided by ethyl chloride and a 20-gauge needle was used to inject the knee area. The injection was tolerated well.  A band aid dressing was applied.  The patient was advised to apply ice later today and tomorrow to the injection sight as needed.  Encounter Diagnoses  Name Primary?   Chronic pain of both knees Yes   Anticoagulated    She needs total knees to correct her problem but she would need to see cardiologist to evaluate and make sure she is a medical candidate to have the total knee procedure.  I will see as needed.  Call if any problem.  Precautions discussed.  Electronically Signed Sanjuana Kava, MD 6/6/20238:18 AM

## 2021-08-21 ENCOUNTER — Telehealth: Payer: Self-pay | Admitting: Student

## 2021-08-21 NOTE — Telephone Encounter (Signed)
     Discussed with the patient's NP at [facility] that she would be at higher risk for cardiac complications given her cardiomyopathy. She is scheduled for a repeat echocardiogram next month and if her EF has improved, this would help with risk assessment. Reviewed with the NP that I am overall concerned about her candidacy for knee replacement surgery as the patient reports significant back pain and is mostly wheelchair-bound/bedbound which would limit her ability to participate in PT and unsure if knee replacement would help with her overall quality of life.  Her NP plans to discuss this further with the patient and her daughter.  Signed, Erma Heritage, PA-C 08/21/2021, 10:14 AM

## 2021-08-21 NOTE — Telephone Encounter (Signed)
Delene Lewis from [facility] calling to speak with Krystal Ali to discuss if a knee replacement surgery would be an option for the patient. She states they are not currently requesting a clearance.

## 2021-09-19 ENCOUNTER — Ambulatory Visit (HOSPITAL_COMMUNITY)
Admission: RE | Admit: 2021-09-19 | Discharge: 2021-09-19 | Disposition: A | Discharge status: Home / Self Care | Payer: Medicare (Managed Care) | Source: Ambulatory Visit | Attending: Student | Admitting: Student

## 2021-09-19 DIAGNOSIS — I5021 Acute systolic (congestive) heart failure: Secondary | ICD-10-CM | POA: Diagnosis not present

## 2021-09-19 DIAGNOSIS — I5020 Unspecified systolic (congestive) heart failure: Secondary | ICD-10-CM | POA: Diagnosis not present

## 2021-09-19 LAB — ECHOCARDIOGRAM LIMITED: S' Lateral: 3.4 cm

## 2021-09-19 NOTE — Progress Notes (Signed)
*  PRELIMINARY RESULTS* Echocardiogram Limited 2-D Echocardiogram  has been performed.  Samuel Germany 09/19/2021, 2:39 PM

## 2021-10-16 ENCOUNTER — Telehealth: Payer: Self-pay | Admitting: Cardiology

## 2021-10-16 NOTE — Telephone Encounter (Signed)
I spoke with Krystal Ali at nursing home.They will weigh patient weekly now and call us if she gains more than 5 lbs in 1 week.

## 2021-10-16 NOTE — Telephone Encounter (Signed)
Pt c/o swelling: STAT is pt has developed SOB within 24 hours  If swelling, where is the swelling located? None  How much weight have you gained and in what time span?  10 lbs within a mont   Have you gained 3 pounds in a day or 5 pounds in a week?   Do you have a log of your daily weights (if so, list)? No  Are you currently taking a fluid pill?  Yes  Are you currently SOB?  No  Have you traveled recently?  No  Caller stated they are concerned about her recent weight gain.

## 2021-10-16 NOTE — Telephone Encounter (Signed)
    If she has not experienced any symptoms and this has been a gradual weight gain of 8 pounds over the past month, it is hard to say if this is due to fluid or not. Recent echocardiogram showed that her EF had improved to 45 to 50%. If able, I would encourage them to follow weekly weights and if this increases by more than 5 pounds in 1 week, please make [place] aware as we may need to adjust her Lasix dosing.  Signed, Erma Heritage, PA-C 10/16/2021, 4:28 PM Pager: [phone]

## 2021-10-16 NOTE — Telephone Encounter (Signed)
NP at nursing home told staff to let us know patient had gained 8.8 lbs in the last month. Was seen today for routine visit.   09/21/21 weight: 174.6 lbs  Weight today 8/8 was 183.4 lbs   Staff stated Ms.Barga has no swelling noted, no c/o SOB. Her diet has not changed.

## 2021-11-20 DIAGNOSIS — M958 Other specified acquired deformities of musculoskeletal system: Secondary | ICD-10-CM | POA: Diagnosis not present

## 2021-11-20 DIAGNOSIS — M26609 Unspecified temporomandibular joint disorder, unspecified side: Secondary | ICD-10-CM | POA: Diagnosis not present

## 2021-11-20 DIAGNOSIS — H9203 Otalgia, bilateral: Secondary | ICD-10-CM | POA: Diagnosis not present

## 2021-11-20 DIAGNOSIS — Z789 Other specified health status: Secondary | ICD-10-CM | POA: Diagnosis not present

## 2021-11-20 DIAGNOSIS — R040 Epistaxis: Secondary | ICD-10-CM | POA: Diagnosis not present

## 2021-11-21 NOTE — Progress Notes (Signed)
Cardiology Office Note:    Date:  11/22/2021   ID:  Krystal Ali, DOB 09/05/1945, MRN [id number]  PCP:  Krystal Ali, [place] [facility] Cardiologist:  Krystal Bergeron, MD  Referring MD: Krystal Ali, *    History of Present Illness:    Krystal Ali is a 76 y.o. female with a hx of with history of persistent atrial fibrillation (diagnosed in 05/2021) s/p DCCV on 07/06/21, HFrEF (EF 20-25% by echo in 05/2021), HTN and prediabetes who presents to the office today for follow-up.  She initially presented to [facility] ED on 05/21/2021 from SNF for evaluation of worsening dyspnea and was found to be in atrial fibrillation with RVR upon arrival.  She was started on IV Cardizem but follow-up echocardiogram showed that her EF was reduced at 20 to 25%, therefore Cardizem was discontinued and switched to IV Amiodarone. 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 She was started on Toprol-XL and Spironolactone for her cardiomyopathy but BP did not allow for the use of an ACE-I/ARB/ARNI and she was not started on an SGLT2 inhibitor given her recent UTI and bed-bound status.   She returned for DCCV on 4/28 with successful return to NSR. Her metop was decreased to 12.87m daily. She presented back to the ER the following day with dizziness/hypotension but this resolved with IVF. She had recently started on oxycodone and this was thought to be the primary driver of symptoms. Was last seen in clinic by BBernerd Pho PA where she was having atypical chest pain. Repeat TTE 09/19/21 with EF 45-50% which is significantly improved from prior.   Today, the patient states that she feels overall well. No palpitations, chest pain, SOB, orthopnea or PND. HR have been controlled with no recurrence of palpitations or known episodes of Afib. Wt has been increasing to 187lbs but she attributes this to eating a lot more  than usual and she denies HF symptoms as above. Tolerating medications without issue. She states she is wanting to have knee surgery and would like a preoperative evaluation.   Past Medical History:  Diagnosis Date   Anxiety    Atrial fibrillation ([place]    Diabetes mellitus without complication ([place]    Dysrhythmia    GERD (gastroesophageal reflux disease)    Hyperlipidemia    Hypertension    Retention of urine, unspecified    Sleep apnea    Spinal stenosis of lumbar region without neurogenic claudication     Past Surgical History:  Procedure Laterality Date   APPENDECTOMY     BACK SURGERY  08/2020   BIOPSY  09/20/2020   Procedure: BIOPSY;  Surgeon: CHarvel Quale MD;  [facility];  Service: Gastroenterology;;   BREAST SURGERY     CARDIOVERSION N/A 07/06/2021   Procedure: CARDIOVERSION;  Surgeon: MSatira Sark MD;  Location: [facility] ORS;  Service: Cardiovascular;  Laterality: N/A;   CHOLECYSTECTOMY     COLONOSCOPY WITH PROPOFOL N/A 09/20/2020   Procedure: COLONOSCOPY WITH PROPOFOL;  Surgeon: CHarvel Quale MD;  Location: [facility];  Service: Gastroenterology;  Laterality: N/A;   ESOPHAGOGASTRODUODENOSCOPY (EGD) WITH PROPOFOL N/A 09/20/2020   Procedure: ESOPHAGOGASTRODUODENOSCOPY (EGD) WITH PROPOFOL;  Surgeon: CHarvel Quale MD;  Location: [facility];  Service: Gastroenterology;  Laterality: N/A;   POLYPECTOMY  09/20/2020   Procedure: POLYPECTOMY;  Surgeon: CHarvel Quale MD;  [facility]: [facility];  Service: Gastroenterology;;  Current Medications: Current Meds  Medication Sig   acetaminophen (TYLENOL) 325 MG tablet Take 2 tablets (650 mg total) by mouth every 6 (six) hours as needed for mild pain (or Fever >/= 101). (Patient taking differently: Take 650 mg by mouth every 6 (six) hours as needed for mild pain (or Fever >/= 101). Do not exceed 3,000 mg in 24 hrs)   amiodarone (PACERONE) 200 MG tablet Take 200 mg  by mouth daily.   apixaban (ELIQUIS) 5 MG TABS tablet Take 1 tablet (5 mg total) by mouth 2 (two) times daily.   blood glucose meter kit and supplies Dispense based on patient and insurance preference. Use up to four times daily as directed. (FOR ICD-10 E10.9, E11.9).   ciprofloxacin (CIPRO) 500 MG tablet Take 1 tablet (500 mg total) by mouth every 12 (twelve) hours.   diclofenac Sodium (VOLTAREN) 1 % GEL Apply 2 g topically in the morning and at bedtime.   ferrous sulfate 325 (65 FE) MG tablet Take 325 mg by mouth daily with breakfast.   fluticasone (FLONASE) 50 MCG/ACT nasal spray Place 1 spray into both nostrils 2 (two) times daily.   furosemide (LASIX) 40 MG tablet Take 1 tablet (40 mg total) by mouth daily.   INCRUSE ELLIPTA 62.5 MCG/ACT AEPB Inhale 1 puff into the lungs daily.   ipratropium-albuterol (DUONEB) 0.5-2.5 (3) MG/3ML SOLN Take 3 mLs by nebulization every 6 (six) hours as needed (SOB).   metoprolol succinate (TOPROL XL) 25 MG 24 hr tablet Take 0.5 tablets (12.5 mg total) by mouth daily.   Multiple Vitamin (MULTIVITAMIN WITH MINERALS) TABS tablet Take 1 tablet by mouth daily.   nitrofurantoin, macrocrystal-monohydrate, (MACROBID) 100 MG capsule Take 1 capsule (100 mg total) by mouth every 12 (twelve) hours.   OXYGEN Inhale 2 L into the lungs as needed (Shortness of Breath).   pantoprazole (PROTONIX) 40 MG tablet Take 1 tablet (40 mg total) by mouth 2 (two) times daily. (Patient taking differently: Take 40 mg by mouth daily.)   polyethylene glycol (MIRALAX / GLYCOLAX) 17 g packet Take 17 g by mouth daily.   SPIRIVA HANDIHALER 18 MCG inhalation capsule Place 1 capsule into inhaler and inhale daily.   spironolactone (ALDACTONE) 25 MG tablet Take 0.5 tablets (12.5 mg total) by mouth daily.   tamsulosin (FLOMAX) 0.4 MG CAPS capsule Take 1 capsule (0.4 mg total) by mouth daily after supper.   venlafaxine XR (EFFEXOR-XR) 150 MG 24 hr capsule Take 1 capsule (150 mg total) by mouth daily  with breakfast.     Allergies:   Demerol [meperidine hcl] and Morphine and related   Social History   Socioeconomic History   Marital status: Divorced    Spouse name: Not on file   Number of children: Not on file   Years of education: Not on file   Highest education level: Not on file  Occupational History   Not on file  Tobacco Use   Smoking status: Never   Smokeless tobacco: Never  Vaping Use   Vaping Use: Never used  Substance and Sexual Activity   Alcohol use: Not Currently   Drug use: Never   Sexual activity: Not Currently  Other Topics Concern   Not on file  Social History Narrative   Lives at [facility]   Social Determinants of Health   Financial Resource Strain: Not on file  Food Insecurity: Not on file  Transportation Needs: Not on file  Physical Activity: Not on file  Stress: Not on file  Social Connections: Not on file     Family History: The patient's family history includes COPD in her brother; Heart disease in her father.  ROS:   Please see the history of present illness.     All other systems reviewed and are negative.  EKGs/Labs/Other Studies Reviewed:    The following studies were reviewed today: Echocardiogram: 05/22/2021 IMPRESSIONS     1. Left ventricular ejection fraction, by estimation, is 20 to 25%. The  left ventricle has severely decreased function. The left ventricle  demonstrates global hypokinesis. Diastolic function indeterminant due to  Afib. Elevated left atrial pressure.   2. Right ventricular systolic function mildly-to-moderately reduced. The  right ventricular size is normal. There is moderately elevated pulmonary  artery systolic pressure. The estimated right ventricular systolic  pressure is 46.9 mmHg.   3. Left atrial size was moderately dilated.   4. Right atrial size was moderately dilated.   5. The mitral valve is abnormal. Mild mitral valve regurgitation.   6. Tricuspid valve regurgitation is mild to moderate.    7. The aortic valve is tricuspid. There is mild calcification of the  aortic valve. There is mild thickening of the aortic valve. Aortic valve  regurgitation is not visualized. Aortic valve sclerosis/calcification is  present, without any evidence of  aortic stenosis.   8. The inferior vena cava is dilated in size with <50% respiratory  variability, suggesting right atrial pressure of 15 mmHg.   Comparison(s): No prior Echocardiogram.     EKG:  EKG 08/09/21: NSR  Recent Labs: 05/21/2021: TSH 2.946 05/22/2021: B Natriuretic Peptide 269.0 05/24/2021: Magnesium 1.9 07/07/2021: ALT 15; BUN 46; Creatinine, Ser 1.15; Hemoglobin 9.7; Platelets 170; Potassium 3.8; Sodium 135  Recent Lipid Panel No results found for: "CHOL", "TRIG", "HDL", "CHOLHDL", "VLDL", "LDLCALC", "LDLDIRECT"   Risk Assessment/Calculations:    CHA2DS2-VASc Score = 6   This indicates a 9.7% annual risk of stroke. The patient's score is based upon: CHF History: 1 HTN History: 1 Diabetes History: 1 Stroke History: 0 Vascular Disease History: 0 Age Score: 2 Gender Score: 1        Physical Exam:    VS:  BP 132/80   Pulse 93   Ht _0  (1.651 m)   Wt 182 lb (82.6 kg)   SpO2 98%   BMI 30.29 kg/m     Wt Readings from Last 3 Encounters:  11/22/21 182 lb (82.6 kg)  08/09/21 170 lb (77.1 kg)  07/17/21 178 lb (80.7 kg)     GEN:  Chronically ill appearing, wheelchair bound, on 2L [place] HEENT: Normal NECK: No JVD; No carotid bruits CARDIAC: RRR, no murmurs, rubs, gallops RESPIRATORY:  Diminished but clear ABDOMEN: Soft, non-tender, non-distended MUSCULOSKELETAL:  No edema; No deformity  SKIN: Warm and dry NEUROLOGIC:  Alert and oriented x 3 PSYCHIATRIC:  Normal affect   ASSESSMENT:    1. Persistent atrial fibrillation ([place])   2. Preop cardiovascular exam   3. Current use of long term anticoagulation   4. HFrEF (heart failure with reduced ejection fraction) ([place])   5. Essential hypertension   6. Chronic  systolic heart failure (HCC)    PLAN:    In order of problems listed above:  #Persistent Afib: #Acquired Hypercoaguable state:: Initially diagnosed in 05/2021 where she presented with UTI found to be in new atrial fibrillation with RVR. TTE that admission showed EF 20-25% thought to be tachycardia mediated. She was managed medically that admission and ultimately came back for scheduled outpatient DCCV  on 06/2021 with successful return to NSR. Has been mantained on amiodarone 261m daily and metop 12.557mdaily. [facility]-Continue amiodarone 20024maily; can likely trial lower dose at next visit. Did not want to lower prior to possible right knee procedure -Conitnue metop 12.5mg71m daily -Continue apixaban 5mg 56m  #Chronic Systolic HF with Improved EF 20-25%>45-50%: #Tachy Induced CM: TTE 05/2021 with LVEF 20-25% in the setting of Afib with RVR. 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 Now s/p DCCV as detailed above. Repeat TTE 09/2021 with improved EF 45-50%.  Appears euvolemic and compensated on exam. GDMT limited due to soft pressures. -Continue metop 12.5mg d110my -Continue spiro 12.5mg da98m -Will not add ARB/ACE/ARNI due to soft pressures -No SGLT2i due to history of UTIs and patient is bedbound  #Pre-operative Evaluation prior to Right TKA: Patient is bedbound at baseline and is unable to complete >4METs. Will proceed with stress testing to ensure no high risk features. Overall, she is at least moderate risk for the OR due to significant comorbidities including systolic HF, Afib, chronic hypoxia on O2, and poor functional status. -Check myoview for further evaluation -Will be at least moderate risk for OR due to comorbid conditions           Medication Adjustments/Labs and Tests Ordered: Current medicines are reviewed at length with the patient today.  Concerns regarding medicines are outlined above.  Orders Placed This Encounter  Procedures   NM Myocar Multi W/Spect W/Wall  Motion / EF   No orders of the defined types were placed in this encounter.   Patient Instructions  Medication Instructions:  Your physician recommends that you continue on your current medications as directed. Please refer to the Current Medication list given to you today.   Labwork: None  Testing/Procedures: Your physician has requested that you have a lexiscan myoview. For further information please visit [email or web address] follow instruction sheet, as given.   Follow-Up: Follow up with Dr. PembertJohney Frameonths.   Any Other Special Instructions Will Be Listed Below (If Applicable).     If you need a refill on your cardiac medications before your next appointment, please call your pharmacy.    Signed, HeatherFreada Ali/14/2023 2:35 PM    [facility]

## 2021-11-22 ENCOUNTER — Ambulatory Visit: Payer: Medicare (Managed Care) | Attending: Student | Admitting: Cardiology

## 2021-11-22 ENCOUNTER — Encounter: Payer: Self-pay | Admitting: Cardiology

## 2021-11-22 ENCOUNTER — Ambulatory Visit: Payer: Medicare (Managed Care) | Admitting: Student

## 2021-11-22 VITALS — BP 132/80 | HR 93 | Ht 65.0 in | Wt 182.0 lb

## 2021-11-22 DIAGNOSIS — Z7901 Long term (current) use of anticoagulants: Secondary | ICD-10-CM

## 2021-11-22 DIAGNOSIS — I4819 Other persistent atrial fibrillation: Secondary | ICD-10-CM | POA: Diagnosis not present

## 2021-11-22 DIAGNOSIS — I5020 Unspecified systolic (congestive) heart failure: Secondary | ICD-10-CM

## 2021-11-22 DIAGNOSIS — I5022 Chronic systolic (congestive) heart failure: Secondary | ICD-10-CM

## 2021-11-22 DIAGNOSIS — Z01810 Encounter for preprocedural cardiovascular examination: Secondary | ICD-10-CM

## 2021-11-22 DIAGNOSIS — I10 Essential (primary) hypertension: Secondary | ICD-10-CM

## 2021-11-22 NOTE — Patient Instructions (Signed)
Medication Instructions:  Your physician recommends that you continue on your current medications as directed. Please refer to the Current Medication list given to you today.   Labwork: None  Testing/Procedures: Your physician has requested that you have a lexiscan myoview. For further information please visit [email or web address]. Please follow instruction sheet, as given.   Follow-Up: Follow up with Dr. Johney Frame in 3 months.   Any Other Special Instructions Will Be Listed Below (If Applicable).     If you need a refill on your cardiac medications before your next appointment, please call your pharmacy.

## 2021-11-26 ENCOUNTER — Encounter (HOSPITAL_COMMUNITY): Payer: Medicare (Managed Care)

## 2021-11-26 ENCOUNTER — Ambulatory Visit (HOSPITAL_COMMUNITY): Payer: Medicare (Managed Care)

## 2021-11-28 ENCOUNTER — Ambulatory Visit (HOSPITAL_COMMUNITY): Payer: Medicare (Managed Care)

## 2021-11-28 ENCOUNTER — Ambulatory Visit (HOSPITAL_COMMUNITY): Payer: Medicare (Managed Care) | Attending: Student

## 2021-12-04 ENCOUNTER — Telehealth: Payer: Self-pay

## 2021-12-04 DIAGNOSIS — I5021 Acute systolic (congestive) heart failure: Secondary | ICD-10-CM

## 2021-12-04 MED ORDER — ROSUVASTATIN CALCIUM 10 MG PO TABS: 10.0000 mg | ORAL_TABLET | Freq: Every day | ORAL | 3 refills | Status: AC

## 2021-12-04 NOTE — Telephone Encounter (Signed)
Nurse- Anderson Malta- at [facility] notified and verbalized understanding. Nurse had no questions or concerns at this time. Results faxed to [facility].

## 2021-12-04 NOTE — Telephone Encounter (Signed)
-----   Message from Freada Bergeron, MD sent at 12/04/2021  1:45 PM EDT ----- Her cholesterol and triglycerides look elevated on the labs sent (thank you for sending). Would she be willing to trial crestor '10mg'$  daily to help lower her cholesterol/TG? If so, can we get repeat lipids in 6-8 weeks after starting the medications

## 2021-12-12 ENCOUNTER — Encounter (HOSPITAL_COMMUNITY): Payer: Medicare (Managed Care)

## 2021-12-13 ENCOUNTER — Ambulatory Visit (HOSPITAL_COMMUNITY): Payer: Medicare (Managed Care)

## 2021-12-13 ENCOUNTER — Encounter (HOSPITAL_COMMUNITY): Payer: Medicare (Managed Care)

## 2022-01-10 ENCOUNTER — Encounter (HOSPITAL_COMMUNITY): Payer: Medicare (Managed Care)

## 2022-01-11 ENCOUNTER — Encounter (HOSPITAL_COMMUNITY): Payer: Medicare (Managed Care)

## 2022-01-31 NOTE — Progress Notes (Deleted)
Cardiology Office Note:    Date:  11/22/2021   ID:  [name], DOB Jan 29, 1946, MRN [id number]  PCP:  Hal Morales, [place] [facility] Cardiologist:  Freada Bergeron, MD  Referring MD: Hal Morales, *    History of Present Illness:    Krystal Ali is a 76 y.o. female with a hx of with history of persistent atrial fibrillation (diagnosed in 05/2021) s/p DCCV on 07/06/21, HFrEF (EF 20-25% by echo in 05/2021), HTN and prediabetes who presents to the office today for follow-up.  She initially presented to [facility] ED on 05/21/[facility]21 from SNF for evaluation of worsening dyspnea and was found to be in atrial fibrillation with RVR upon arrival.  She was started on IV Cardizem but follow-up echocardiogram showed that her EF was reduced at 20 to 25%, therefore Cardizem was discontinued and switched to IV Amiodarone. 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 She was started on Toprol-XL and Spironolactone for her cardiomyopathy but BP did not allow for the use of an ACE-I/ARB/ARNI and she was not started on an SGLT2 inhibitor given her recent UTI and bed-bound status.   She returned for DCCV on 4/28 with successful return to NSR. Her metop was decreased to 12.67m daily. She presented back to the ER the following day with dizziness/hypotension but this resolved with IVF. She had recently started on oxycodone and this was thought to be the primary driver of symptoms. Was last seen in clinic by BBernerd Pho PA where she was having atypical chest pain. Repeat TTE 09/19/21 with EF 45-50% which is significantly improved from prior.   Was last seen in clinic on 11/2021 where she was doing well from a CV standpoint. Was planned for knee surgery. Myoview was ordered but not performed.   Today, ***    Past Medical History:  Diagnosis Date   Anxiety    Atrial fibrillation ([facility]    Diabetes  mellitus without complication ([facility]    Dysrhythmia    GERD (gastroesophageal reflux disease)    Hyperlipidemia    Hypertension    Retention of urine, unspecified    Sleep apnea    Spinal stenosis of lumbar region without neurogenic claudication     Past Surgical History:  Procedure Laterality Date   APPENDECTOMY     BACK SURGERY  08/2020   BIOPSY  09/20/2020   Procedure: BIOPSY;  Surgeon: CHarvel Quale MD;  [facility];  Service: Gastroenterology;;   BREAST SURGERY     CARDIOVERSION N/A 07/06/2021   Procedure: CARDIOVERSION;  Surgeon: MSatira Sark MD;  Location: [facility] ORS;  Service: Cardiovascular;  Laterality: N/A;   CHOLECYSTECTOMY     COLONOSCOPY WITH PROPOFOL N/A 09/20/2020   Procedure: COLONOSCOPY WITH PROPOFOL;  Surgeon: CHarvel Quale MD;  Location: [facility];  Service: Gastroenterology;  Laterality: N/A;   ESOPHAGOGASTRODUODENOSCOPY (EGD) WITH PROPOFOL N/A 09/20/2020   Procedure: ESOPHAGOGASTRODUODENOSCOPY (EGD) WITH PROPOFOL;  Surgeon: CHarvel Quale MD;  Location[facility];  Service: Gastroenterology;  Laterality: N/A;   POLYPECTOMY  09/20/2020   Procedure: POLYPECTOMY;  Surgeon: CMontez Morita DQuillian Quince MD;  Location: [facility];  Service: Gastroenterology;;    Current Medications: Current Meds  Medication Sig   acetaminophen (TYLENOL) 325 MG tablet Take 2 tablets (650 mg total) by mouth every 6 (six) hours as needed for mild pain (or Fever >/= 101). (Patient taking differently: Take 650 mg  by mouth every 6 (six) hours as needed for mild pain (or Fever >/= 101). Do not exceed 3,000 mg in 24 hrs)   amiodarone (PACERONE) 200 MG tablet Take 200 mg by mouth daily.   apixaban (ELIQUIS) 5 MG TABS tablet Take 1 tablet (5 mg total) by mouth 2 (two) times daily.   blood glucose meter kit and supplies Dispense based on patient and insurance preference. Use up to four times daily as directed. (FOR ICD-10 E10.9, E11.9).    ciprofloxacin (CIPRO) 500 MG tablet Take 1 tablet (500 mg total) by mouth every 12 (twelve) hours.   diclofenac Sodium (VOLTAREN) 1 % GEL Apply 2 g topically in the morning and at bedtime.   ferrous sulfate 325 (65 FE) MG tablet Take 325 mg by mouth daily with breakfast.   fluticasone (FLONASE) 50 MCG/ACT nasal spray Place 1 spray into both nostrils 2 (two) times daily.   furosemide (LASIX) 40 MG tablet Take 1 tablet (40 mg total) by mouth daily.   INCRUSE ELLIPTA 62.5 MCG/ACT AEPB Inhale 1 puff into the lungs daily.   ipratropium-albuterol (DUONEB) 0.5-2.5 (3) MG/3ML SOLN Take 3 mLs by nebulization every 6 (six) hours as needed (SOB).   metoprolol succinate (TOPROL XL) 25 MG 24 hr tablet Take 0.5 tablets (12.5 mg total) by mouth daily.   Multiple Vitamin (MULTIVITAMIN WITH MINERALS) TABS tablet Take 1 tablet by mouth daily.   nitrofurantoin, macrocrystal-monohydrate, (MACROBID) 100 MG capsule Take 1 capsule (100 mg total) by mouth every 12 (twelve) hours.   OXYGEN Inhale 2 L into the lungs as needed (Shortness of Breath).   pantoprazole (PROTONIX) 40 MG tablet Take 1 tablet (40 mg total) by mouth 2 (two) times daily. (Patient taking differently: Take 40 mg by mouth daily.)   polyethylene glycol (MIRALAX / GLYCOLAX) 17 g packet Take 17 g by mouth daily.   SPIRIVA HANDIHALER 18 MCG inhalation capsule Place 1 capsule into inhaler and inhale daily.   spironolactone (ALDACTONE) 25 MG tablet Take 0.5 tablets (12.5 mg total) by mouth daily.   tamsulosin (FLOMAX) 0.4 MG CAPS capsule Take 1 capsule (0.4 mg total) by mouth daily after supper.   venlafaxine XR (EFFEXOR-XR) 150 MG 24 hr capsule Take 1 capsule (150 mg total) by mouth daily with breakfast.     Allergies:   Demerol [meperidine hcl] and Morphine and related   Social History   Socioeconomic History   Marital status: Divorced    Spouse name: Not on file   Number of children: Not on file   Years of education: Not on file   Highest  education level: Not on file  Occupational History   Not on file  Tobacco Use   Smoking status: Never   Smokeless tobacco: Never  Vaping Use   Vaping Use: Never used  Substance and Sexual Activity   Alcohol use: Not Currently   Drug use: Never   Sexual activity: Not Currently  Other Topics Concern   Not on file  Social History Narrative   Lives at [facility]   Financial Resource Strain: Not on file  Food Insecurity: Not on file  Transportation Needs: Not on file  Physical Activity: Not on file  Stress: Not on file  Social Connections: Not on file     Family History: The patient's family history includes COPD in her brother; Heart disease in her father.  ROS:   Please see the history of present illness.     All  other systems reviewed and are negative.  EKGs/Labs/Other Studies Reviewed:    The following studies were reviewed today: Echocardiogram: 05/22/2021 IMPRESSIONS     1. Left ventricular ejection fraction, by estimation, is 20 to 25%. The  left ventricle has severely decreased function. The left ventricle  demonstrates global hypokinesis. Diastolic function indeterminant due to  Afib. Elevated left atrial pressure.   2. Right ventricular systolic function mildly-to-moderately reduced. The  right ventricular size is normal. There is moderately elevated pulmonary  artery systolic pressure. The estimated right ventricular systolic  pressure is 41.2 mmHg.   3. Left atrial size was moderately dilated.   4. Right atrial size was moderately dilated.   5. The mitral valve is abnormal. Mild mitral valve regurgitation.   6. Tricuspid valve regurgitation is mild to moderate.   7. The aortic valve is tricuspid. There is mild calcification of the  aortic valve. There is mild thickening of the aortic valve. Aortic valve  regurgitation is not visualized. Aortic valve sclerosis/calcification is  present, without any evidence of  aortic  stenosis.   8. The inferior vena cava is dilated in size with <50% respiratory  variability, suggesting right atrial pressure of 15 mmHg.   Comparison(s): No prior Echocardiogram.     EKG:  EKG 08/09/21: NSR  Recent Labs: 05/21/2021: TSH 2.946 05/22/2021: B Natriuretic Peptide 269.0 05/24/2021: Magnesium 1.9 07/07/2021: ALT 15; BUN 46; Creatinine, Ser 1.15; Hemoglobin 9.7; Platelets 170; Potassium 3.8; Sodium 135  Recent Lipid Panel No results found for: "CHOL", "TRIG", "HDL", "CHOLHDL", "VLDL", "LDLCALC", "LDLDIRECT"   Risk Assessment/Calculations:    CHA2DS2-VASc Score = 6   This indicates a 9.7% annual risk of stroke. The patient's score is based upon: CHF History: 1 HTN History: 1 Diabetes History: 1 Stroke History: 0 Vascular Disease History: 0 Age Score: 2 Gender Score: 1        Physical Exam:    VS:  BP 132/80   Pulse 93   Ht 5' 5" (1.651 m)   Wt 182 lb (82.6 kg)   SpO2 98%   BMI 30.29 kg/m     Wt Readings from Last 3 Encounters:  11/22/21 182 lb (82.6 kg)  08/09/21 170 lb (77.1 kg)  07/17/21 178 lb (80.7 kg)     GEN:  Chronically ill appearing, wheelchair bound, on 2L [place] HEENT: Normal NECK: No JVD; No carotid bruits CARDIAC: RRR, no murmurs, rubs, gallops RESPIRATORY:  Diminished but clear ABDOMEN: Soft, non-tender, non-distended MUSCULOSKELETAL:  No edema; No deformity  SKIN: Warm and dry NEUROLOGIC:  Alert and oriented x 3 PSYCHIATRIC:  Normal affect   ASSESSMENT:    1. Persistent atrial fibrillation ([place])   2. Preop cardiovascular exam   3. Current use of long term anticoagulation   4. HFrEF (heart failure with reduced ejection fraction) ([place])   5. Essential hypertension   6. Chronic systolic heart failure (HCC)    PLAN:    In order of problems listed above:  #Persistent Afib: #Acquired Hypercoaguable state:: Initially diagnosed in 05/2021 where she presented with UTI found to be in new atrial fibrillation with RVR. TTE that admission  showed EF 20-25% thought to be tachycardia mediated. She was managed medically that admission and ultimately came back for scheduled outpatient DCCV on 06/2021 with successful return to NSR. Has been mantained on amiodarone 221m daily and metop 12.534mdaily. [facility]-Continue amiodarone 20093maily; can likely trial lower dose at next visit. Did not want to lower prior to  possible right knee procedure -Conitnue metop 12.62m XL daily -Continue apixaban 560mBID  #Chronic Systolic HF with Improved EF 20-25%>45-50%: #Tachy Induced CM: TTE 05/2021 with LVEF 20-25% in the setting of Afib with RVR. 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 Now s/p DCCV as detailed above. Repeat TTE 09/2021 with improved EF 45-50%.  Appears euvolemic and compensated on exam. GDMT limited due to soft pressures. -Continue metop 12.67m67maily -Continue spiro 12.67mg23mily -Will not add ARB/ACE/ARNI due to soft pressures -No SGLT2i due to history of UTIs and patient is bedbound  #HLD: -Continue crestor 10mg10mly  #Pre-operative Evaluation prior to Right TKA: Patient is bedbound at baseline and is unable to complete >4METs. Will proceed with stress testing to ensure no high risk features. Overall, she is at least moderate risk for the OR due to significant comorbidities including systolic HF, Afib, chronic hypoxia on O2, and poor functional status. -Check myoview for further evaluation -Will be at least moderate risk for OR due to comorbid conditions           Medication Adjustments/Labs and Tests Ordered: Current medicines are reviewed at length with the patient today.  Concerns regarding medicines are outlined above.  Orders Placed This Encounter  Procedures   NM Myocar Multi W/Spect W/Wall Motion / EF   No orders of the defined types were placed in this encounter.   Patient Instructions  Medication Instructions:  Your physician recommends that you continue on your current medications as directed. Please  refer to the Current Medication list given to you today.   Labwork: None  Testing/Procedures: Your physician has requested that you have a lexiscan myoview. For further information please visit [email or web address] follow instruction sheet, as given.   Follow-Up: Follow up with Dr. PembeJohney Frame months.   Any Other Special Instructions Will Be Listed Below (If Applicable).     If you need a refill on your cardiac medications before your next appointment, please call your pharmacy.    Signed, HeathFreada Bergeron 11/22/2021 2:35 PM    [facility]

## 2022-02-04 ENCOUNTER — Ambulatory Visit: Payer: Medicare (Managed Care) | Admitting: Cardiology

## 2022-02-13 ENCOUNTER — Encounter: Payer: Self-pay | Admitting: *Deleted

## 2022-02-13 ENCOUNTER — Ambulatory Visit: Payer: Medicare (Managed Care) | Attending: Cardiology | Admitting: Internal Medicine

## 2022-02-13 ENCOUNTER — Encounter: Payer: Self-pay | Admitting: Internal Medicine

## 2022-02-13 VITALS — BP 114/60 | HR 67 | Ht 65.0 in | Wt 192.0 lb

## 2022-02-13 DIAGNOSIS — Z01810 Encounter for preprocedural cardiovascular examination: Secondary | ICD-10-CM | POA: Diagnosis not present

## 2022-02-13 DIAGNOSIS — I429 Cardiomyopathy, unspecified: Secondary | ICD-10-CM

## 2022-02-13 DIAGNOSIS — I5020 Unspecified systolic (congestive) heart failure: Secondary | ICD-10-CM

## 2022-02-13 DIAGNOSIS — I5022 Chronic systolic (congestive) heart failure: Secondary | ICD-10-CM | POA: Diagnosis not present

## 2022-02-13 NOTE — Progress Notes (Signed)
Cardiology Office Note  Date: 02/13/2022   ID: Krystal Ali, DOB 02-15-1946, MRN [id number]  PCP:  Krystal Morales, DO  Cardiologist:  Krystal Bergeron, MD Electrophysiologist:  None   Reason for Office Visit: Follow-up of A-fib and preop cardiac risk stratification   History of Present Illness: Krystal Ali is a 76 y.o. 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  Patient was seen by Dr. Johney Ali in 9/23 for preop cardiac risk stratification for right knee replacement. NM stress test was ordered but never done because her daughter and nursing home doctor recommended the patient not get the surgery due to her heart condition. She presents today for follow-up visit.  Denied any angina, DOE, palpitations, syncope, LE swelling. She reported dizziness when she was getting up from the bed.  She has excruciating pain in her right knee and strongly is wanting the right knee replacement to be done.  Denied smoking cigarettes, illicit drug abuse and alcohol use.  Past Medical History:  Diagnosis Date   Anxiety    Atrial fibrillation ([place])    Diabetes mellitus without complication ([place])    Dysrhythmia    GERD (gastroesophageal reflux disease)    Hyperlipidemia    Hypertension    Retention of urine, unspecified    Sleep apnea    Spinal stenosis of lumbar region without neurogenic claudication     Past Surgical History:  Procedure Laterality Date   APPENDECTOMY     BACK SURGERY  08/2020   BIOPSY  09/20/2020   Procedure: BIOPSY;  Surgeon: Harvel Quale, MD;  [facility];  Service: Gastroenterology;;   BREAST SURGERY     CARDIOVERSION N/A 07/06/2021   Procedure: CARDIOVERSION;  Surgeon: Satira Sark,  MD;  Location: [facility] OR[facility];  Service: Cardiovascular;  Laterality: N/A;   CHOLECYSTECTOMY     COLONOSCOPY WITH PROPOFOL N/A 09/20/2020   Procedure: COLONOSCOPY WITH PROPOFOL;  Surgeon: Harvel Quale, MD;  [facility];  Service: Gastroenterology;  Laterality: N/A;   ESOPHAGOGASTRODUODENOSCOPY (EGD) WITH PROPOFOL N/A 09/20/2020   Procedure: ESOPHAGOGASTRODUODENOSCOPY (EGD) WITH PROPOFOL;  Surgeon: Harvel Quale, MD;  [facility];  Service: Gastroenterology;  Laterality: N/A;   POLYPECTOMY  09/20/2020   Procedure: POLYPECTOMY;  Surgeon: Montez Morita, Quillian Quince, MD;  [facility];  Service: Gastroenterology;;    Current Outpatient Medications  Medication Sig Dispense Refill   acetaminophen (TYLENOL) 325 MG tablet Take 2 tablets (650 mg total) by mouth every 6 (six) hours as needed for mild pain (or Fever >/= 101). (Patient taking differently: Take 650 mg by mouth every 6 (six) hours as needed for mild pain (or Fever >/= 101). Do not exceed 3,000 mg in 24 hrs) 12 tablet 0   amiodarone (PACERONE) 200 MG tablet Take 200 mg by mouth daily.     apixaban (ELIQUIS) 5 MG TABS tablet Take 1 tablet (5 mg total) by mouth 2 (two) times daily. 60 tablet    blood glucose meter kit and supplies Dispense based on patient and insurance preference. Use up to four times daily as directed. (FOR ICD-10 E10.9, E11.9). 1 each 0   diclofenac Sodium (VOLTAREN) 1 % GEL Apply 2 g topically in the morning and at  bedtime.     ferrous sulfate 325 (65 FE) MG tablet Take 325 mg by mouth daily with breakfast.     furosemide (LASIX) 40 MG tablet Take 1 tablet (40 mg total) by mouth daily. 30 tablet    gabapentin (NEURONTIN) 300 MG capsule Take by mouth.     HYDROcodone-acetaminophen (NORCO) 7.5-325 MG tablet Take 1 tablet by mouth in the morning, at noon, in the evening, and at bedtime.     ipratropium-albuterol (DUONEB) 0.5-2.5 (3) MG/3ML SOLN Take 3 mLs by nebulization every 6  (six) hours as needed (SOB).     levothyroxine (SYNTHROID) 25 MCG tablet Take 25 mcg by mouth daily.     nitrofurantoin, macrocrystal-monohydrate, (MACROBID) 100 MG capsule Take 1 capsule (100 mg total) by mouth every 12 (twelve) hours. 20 capsule 0   OXYGEN Inhale 2 L into the lungs as needed (Shortness of Breath).     pantoprazole (PROTONIX) 40 MG tablet Take 1 tablet (40 mg total) by mouth 2 (two) times daily. (Patient taking differently: Take 40 mg by mouth daily.) 60 tablet 2   polyethylene glycol (MIRALAX / GLYCOLAX) 17 g packet Take 17 g by mouth daily.     rosuvastatin (CRESTOR) 10 MG tablet Take 1 tablet (10 mg total) by mouth daily. 90 tablet 3   SPIRIVA HANDIHALER 18 MCG inhalation capsule Place 1 capsule into inhaler and inhale daily.     spironolactone (ALDACTONE) 25 MG tablet Take 0.5 tablets (12.5 mg total) by mouth daily.     tamsulosin (FLOMAX) 0.4 MG CAPS capsule Take 1 capsule (0.4 mg total) by mouth daily after supper. 30 capsule 0   venlafaxine XR (EFFEXOR-XR) 150 MG 24 hr capsule Take 1 capsule (150 mg total) by mouth daily with breakfast. 90 capsule 1   ciprofloxacin (CIPRO) 500 MG tablet Take 1 tablet (500 mg total) by mouth every 12 (twelve) hours. (Patient not taking: Reported on 02/13/2022) 10 tablet 0   fluticasone (FLONASE) 50 MCG/ACT nasal spray Place 1 spray into both nostrils 2 (two) times daily. (Patient not taking: Reported on 02/13/2022)     INCRUSE ELLIPTA 62.5 MCG/ACT AEPB Inhale 1 puff into the lungs daily. (Patient not taking: Reported on 02/13/2022)     metoprolol succinate (TOPROL XL) 25 MG 24 hr tablet Take 0.5 tablets (12.5 mg total) by mouth daily. (Patient not taking: Reported on 02/13/2022) 135 tablet 3   Multiple Vitamin (MULTIVITAMIN WITH MINERALS) TABS tablet Take 1 tablet by mouth daily. (Patient not taking: Reported on 02/13/2022)     No current facility-administered medications for this visit.   Allergies:  Demerol [meperidine hcl] and Morphine and  related   Social History: The patient  reports that she has never smoked. She has never used smokeless tobacco. She reports that she does not currently use alcohol. She reports that she does not use drugs.   Family History: The patient's family history includes COPD in her brother; Heart disease in her father.   ROS:  Please see the history of present illness. Otherwise, complete review of systems is positive for none.  All other systems are reviewed and negative.   Physical Exam: VS:  BP 114/60   Pulse 67   Ht _0  (1.651 m)   Wt 192 lb (87.1 kg) Comment: patient states  SpO2 92%   BMI 31.95 kg/m , BMI Body mass index is 31.95 kg/m.  Wt Readings from Last 3 Encounters:  02/13/22 192 lb (87.1 kg)  11/22/21 182 lb (82.6 kg)  08/09/21 170 lb (77.1 kg)    General: Patient appears comfortable at rest. HEENT: Conjunctiva and lids normal, oropharynx clear with moist mucosa. Neck: Supple, no elevated JVP or carotid bruits, no thyromegaly. Lungs: Clear to auscultation, nonlabored breathing at rest. Cardiac: Regular rate and rhythm, no S3 or significant systolic murmur, no pericardial rub. Abdomen: Soft, nontender, no hepatomegaly, bowel sounds present, no guarding or rebound. Extremities: No pitting edema, distal pulses 2+. Skin: Warm and dry. Musculoskeletal: No kyphosis. Neuropsychiatric: Alert and oriented x3, affect grossly appropriate.  ECG:  NSR  Recent Labwork: 05/21/2021: TSH 2.946 05/22/2021: B Natriuretic Peptide 269.0 05/24/2021: Magnesium 1.9 07/07/2021: ALT 15; AST 16; BUN 46; Creatinine, Ser 1.15; Hemoglobin 9.7; Platelets 170; Potassium 3.8; Sodium 135  No results found for: "CHOL", "TRIG", "HDL", "CHOLHDL", "VLDL", "LDLCALC", "LDLDIRECT"  Other Studies Reviewed Today: Echo from 09/2021 LVEF 45 to 50%  Echo from 05/2021 LVEF 20 to 25% elevated left atrial pressure RV systolic function mild to moderately reduced Moderately elevated pulmonary artery systolic  pressure LA and RA moderately dilated Mild MR TR mild to moderate No aortic stenosis or aortic regurgitation  Assessment and Plan: Patient is a 76 year old F known to have persistent A-fib (diagnosed in 3/23) s/p DCCV in 4/23, 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  # Persistent A-fib (diagnosed in 3/23 ) s/p DCCV in 4/23, currently in NSR -Continue amiodarone 200 mg once daily (for total duration of 1 year) -Metoprolol succinate probably held due to dizziness. Can take Metoprolol tartarate 12.40m BID PRN for palpitations. 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 Obtain 2D echocardiogram with contrast to update LVEF and valve abnormalities. -Metoprolol succinate probably held due to dizziness. Can take Metoprolol tartarate 12.577mBID PRN for palpitations. -Continue Spironolactone 2514mnce daily -ACE/ARB/ARNI not added due to soft Bps  #Pre-op cardiac risk stratification for R knee replacement -Patient has METS<4. Obtain Lexiscan to r/o high risk findings. Otherwise, she is at a moderate risk for any peri-operative cardiac events for R knee replacement procedure. Patient is extremely interested to undergo R knee replacement but her daughter and nursing home doctor did not think she will benefit due to ongoing heart issues.  I have spent a total of 33 minutes with patient reviewing chart, EKGs, labs and examining patient as well as establishing an assessment and plan that was discussed with the patient.  > 50% of time was spent in direct patient care.     Medication Adjustments/Labs and Tests Ordered: Current medicines are reviewed at length  with the patient today.  Concerns regarding medicines are outlined above.   Tests Ordered: Orders Placed This Encounter  Procedures   ECHOCARDIOGRAM COMPLETE    Medication Changes: No orders of the defined types were placed in this encounter.   Disposition:  Follow up  4 months  Signed, Dreonna Hussein PriFidel LevyD, 02/13/2022 9:04 AM    [facility]ei[place]ansas[place] [id number]

## 2022-02-13 NOTE — Patient Instructions (Signed)
Medication Instructions:  Your physician recommends that you continue on your current medications as directed. Please refer to the Current Medication list given to you today.  *If you need a refill on your cardiac medications before your next appointment, please call your pharmacy*   Lab Work: NONE   If you have labs (blood work) drawn today and your tests are completely normal, you will receive your results only by: [place] (if you have MyChart) OR A paper copy in the mail If you have any lab test that is abnormal or we need to change your treatment, we will call you to review the results.   Testing/Procedures: Your physician has requested that you have an echocardiogram. Echocardiography is a painless test that uses sound waves to create images of your heart. It provides your doctor with information about the size and shape of your heart and how well your heart's chambers and valves are working. This procedure takes approximately one hour. There are no restrictions for this procedure. Please do NOT wear cologne, perfume, aftershave, or lotions (deodorant is allowed). Please arrive 15 minutes prior to your appointment time.  Your physician has requested that you have a lexiscan myoview. For further information please visit [email or web address]. Please follow instruction sheet, as given.    Follow-Up: At [facility], you and your health needs are our priority.  As part of our continuing mission to provide you with exceptional heart care, we have created designated Provider Care Teams.  These Care Teams include your primary Cardiologist (physician) and Advanced Practice Providers (APPs -  Physician Assistants and Nurse Practitioners) who all work together to provide you with the care you need, when you need it.  We recommend signing up for the patient portal called "MyChart".  Sign up information is provided on this After Visit Summary.  MyChart is used to connect with  patients for Virtual Visits (Telemedicine).  Patients are able to view lab/test results, encounter notes, upcoming appointments, etc.  Non-urgent messages can be sent to your provider as well.   To learn more about what you can do with MyChart, go to [email or web address].    Your next appointment:   4 month(s)  The format for your next appointment:   In Person  Provider:   Claudina Lick, MD    Other Instructions Thank you for choosing [facility]!    Important Information About Sugar

## 2022-02-14 ENCOUNTER — Other Ambulatory Visit (INDEPENDENT_AMBULATORY_CARE_PROVIDER_SITE_OTHER): Payer: Medicare (Managed Care)

## 2022-02-14 ENCOUNTER — Ambulatory Visit: Payer: Medicare (Managed Care) | Admitting: Cardiology

## 2022-02-14 DIAGNOSIS — I429 Cardiomyopathy, unspecified: Secondary | ICD-10-CM

## 2022-02-14 NOTE — Addendum Note (Signed)
Addended by: Barbarann Ehlers A on: 02/14/2022 04:45 PM   Modules accepted: Orders

## 2022-02-20 ENCOUNTER — Ambulatory Visit (HOSPITAL_COMMUNITY): Admission: RE | Admit: 2022-02-20 | Payer: Medicare (Managed Care) | Source: Ambulatory Visit

## 2022-02-22 ENCOUNTER — Encounter (HOSPITAL_COMMUNITY): Payer: Medicare (Managed Care)

## 2022-02-22 ENCOUNTER — Encounter (HOSPITAL_COMMUNITY): Admission: RE | Admit: 2022-02-22 | Payer: Medicare (Managed Care) | Source: Ambulatory Visit

## 2022-03-21 ENCOUNTER — Ambulatory Visit (HOSPITAL_COMMUNITY): Admission: RE | Admit: 2022-03-21 | Payer: Medicare (Managed Care) | Source: Ambulatory Visit

## 2022-04-08 ENCOUNTER — Ambulatory Visit (HOSPITAL_COMMUNITY): Payer: Medicare (Managed Care)

## 2022-05-09 ENCOUNTER — Encounter: Payer: Self-pay | Admitting: Radiology

## 2022-06-11 ENCOUNTER — Encounter: Payer: Self-pay | Admitting: Orthopaedic Surgery

## 2022-06-11 ENCOUNTER — Ambulatory Visit (INDEPENDENT_AMBULATORY_CARE_PROVIDER_SITE_OTHER): Payer: Medicare (Managed Care) | Admitting: Orthopaedic Surgery

## 2022-06-11 DIAGNOSIS — M170 Bilateral primary osteoarthritis of knee: Secondary | ICD-10-CM

## 2022-06-11 MED ORDER — METHYLPREDNISOLONE ACETATE 40 MG/ML IJ SUSP
40.0000 mg | Freq: Once | INTRAMUSCULAR | Status: AC
  Administered 2022-06-11: 40 mg via INTRA_ARTICULAR

## 2022-06-11 NOTE — Progress Notes (Signed)
PROCEDURE NOTE:  The patient requests injections of the right knee , verbal consent was obtained.  The right knee was prepped appropriately after time out was performed.   Sterile technique was observed and injection of 1 cc of DepoMedrol 40mg  with several cc's of plain xylocaine. Anesthesia was provided by ethyl chloride and a 20-gauge needle was used to inject the knee area. The injection was tolerated well.  A band aid dressing was applied.  The patient was advised to apply ice later today and tomorrow to the injection sight as needed.  PROCEDURE NOTE:  The patient requests injections of the left knee , verbal consent was obtained.  The left knee was prepped appropriately after time out was performed.   Sterile technique was observed and injection of 1 cc of DepoMedrol 40 mg with several cc's of plain xylocaine. Anesthesia was provided by ethyl chloride and a 20-gauge needle was used to inject the knee area. The injection was tolerated well.  A band aid dressing was applied.  The patient was advised to apply ice later today and tomorrow to the injection sight as needed.  Encounter Diagnosis  Name Primary?   Primary osteoarthritis of both knees Yes   She would like to have viscosupplementation of both knees done.  We will arrange after getting ok from insurance company.  She is not a surgical candidate.  Call if any problem.  Precautions discussed.  Electronically Signed Sanjuana Kava, MD 4/2/20248:38 AM

## 2022-06-11 NOTE — Patient Instructions (Signed)
We will check which brand of Hyaluronic acid injections (there are several) your insurance covers. We will call you with price and schedule with you if insurance approves and you are okay with the out of pocket costs. If for any reason they will not cover or the out of pocket costs are high, we will discuss with you and let you know other options available. This process normally takes several weeks to hear back from us the insurance approval process takes time.   

## 2022-06-11 NOTE — Addendum Note (Signed)
Addended byCandice Camp on: 06/11/2022 10:19 AM   Modules accepted: Orders

## 2022-06-14 ENCOUNTER — Telehealth: Payer: Self-pay

## 2022-06-14 NOTE — Telephone Encounter (Signed)
-----   Message from Caffie Damme, RT sent at 06/11/2022  8:37 AM EDT ----- Bilateral hyaluronic acid injections

## 2022-06-14 NOTE — Telephone Encounter (Signed)
VOB submitted fr Orthovisc, bilateral knee.

## 2022-06-19 ENCOUNTER — Ambulatory Visit: Payer: Medicare (Managed Care) | Admitting: Internal Medicine

## 2022-07-04 ENCOUNTER — Telehealth: Payer: Self-pay

## 2022-07-04 NOTE — Telephone Encounter (Signed)
PA has been faxed to [facility] at [phone] for authorization for Orthovisc, bilateral knee.

## 2022-07-09 ENCOUNTER — Encounter: Payer: Self-pay | Admitting: Internal Medicine

## 2022-07-09 ENCOUNTER — Ambulatory Visit: Payer: Medicare (Managed Care) | Attending: Internal Medicine | Admitting: Internal Medicine

## 2022-07-09 VITALS — BP 124/70 | HR 68 | Ht 65.0 in | Wt 196.0 lb

## 2022-07-09 DIAGNOSIS — I4891 Unspecified atrial fibrillation: Secondary | ICD-10-CM

## 2022-07-09 DIAGNOSIS — I428 Other cardiomyopathies: Secondary | ICD-10-CM | POA: Insufficient documentation

## 2022-07-09 MED ORDER — METOPROLOL TARTRATE 25 MG PO TABS: 12.5000 mg | ORAL_TABLET | Freq: Two times a day (BID) | ORAL | 3 refills | Status: AC

## 2022-07-09 NOTE — Patient Instructions (Signed)
Medication Instructions:  Your physician has recommended you make the following change in your medication:   -Stop Amiodarone -Stop metoprolol Succinate -Start Metoprolol Tartrate 12.5 mg twice daily   *If you need a refill on your cardiac medications before your next appointment, please call your pharmacy*   Lab Work: None If you have labs (blood work) drawn today and your tests are completely normal, you will receive your results only by: MyChart Message (if you have MyChart) OR A paper copy in the mail If you have any lab test that is abnormal or we need to change your treatment, we will call you to review the results.   Testing/Procedures: Complete Echo   Follow-Up: At [facility], you and your health needs are our priority.  As part of our continuing mission to provide you with exceptional heart care, we have created designated Provider Care Teams.  These Care Teams include your primary Cardiologist (physician) and Advanced Practice Providers (APPs -  Physician Assistants and Nurse Practitioners) who all work together to provide you with the care you need, when you need it.  We recommend signing up for the patient portal called "MyChart".  Sign up information is provided on this After Visit Summary.  MyChart is used to connect with patients for Virtual Visits (Telemedicine).  Patients are able to view lab/test results, encounter notes, upcoming appointments, etc.  Non-urgent messages can be sent to your provider as well.   To learn more about what you can do with MyChart, go to [email or web address].    Your next appointment:   6 month(s)  Provider:   Luane School, MD    Other Instructions

## 2022-07-09 NOTE — Progress Notes (Signed)
Cardiology Office Note  Date: 07/09/2022   ID: Krystal Ali, DOB 03/18/1945, MRN [id number]  PCP:  Sherol Dade, DO  Cardiologist:  Meriam Sprague, MD Electrophysiologist:  None   Reason for Office Visit: Follow-up of A-fib and preop cardiac risk stratification   History of Present Illness: Krystal Ali is a 77 y.o. 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  Patient was seen by Dr. Shari Prows in 9/23 for preop cardiac risk stratification for right knee replacement. NM stress test was ordered but never done because her daughter and nursing home doctor recommended the patient not get the surgery due to her heart condition. She presents today for follow-up visit. She refused to undergo right knee surgery because of her heart condition. Denied any angina, DOE, palpitations, syncope, LE swelling.  Overall doing great. She is not much active, is wheelchair dependent.  Past Medical History:  Diagnosis Date   Anxiety    Atrial fibrillation (HCC)    Diabetes mellitus without complication (HCC)    Dysrhythmia    GERD (gastroesophageal reflux disease)    Hyperlipidemia    Hypertension    Retention of urine, unspecified    Sleep apnea    Spinal stenosis of lumbar region without neurogenic claudication     Past Surgical History:  Procedure Laterality Date   APPENDECTOMY     BACK SURGERY  08/2020   BIOPSY  09/20/2020   Procedure: BIOPSY;  Surgeon: Dolores Frame, MD;  Location: [facility];  Service: Gastroenterology;;   BREAST SURGERY     CARDIOVERSION N/A 07/06/2021   Procedure: CARDIOVERSION;  Surgeon: Jonelle Sidle, MD;  Location: [facility] ORS;  Service: Cardiovascular;   Laterality: N/A;   CHOLECYSTECTOMY     COLONOSCOPY WITH PROPOFOL N/A 09/20/2020   Procedure: COLONOSCOPY WITH PROPOFOL;  Surgeon: Dolores Frame, MD;  Location: [facility];  Service: Gastroenterology;  Laterality: N/A;   ESOPHAGOGASTRODUODENOSCOPY (EGD) WITH PROPOFOL N/A 09/20/2020   Procedure: ESOPHAGOGASTRODUODENOSCOPY (EGD) WITH PROPOFOL;  Surgeon: Dolores Frame, MD;  Location: [facility];  Service: Gastroenterology;  Laterality: N/A;   POLYPECTOMY  09/20/2020   Procedure: POLYPECTOMY;  Surgeon: Marguerita Merles, Reuel Boom, MD;  Location: [facility];  Service: Gastroenterology;;    Current Outpatient Medications  Medication Sig Dispense Refill   acetaminophen (TYLENOL) 325 MG tablet Take 2 tablets (650 mg total) by mouth every 6 (six) hours as needed for mild pain (or Fever >/= 101). (Patient taking differently: Take 650 mg by mouth every 6 (six) hours as needed for mild pain (or Fever >/= 101). Do not exceed 3,000 mg in 24 hrs) 12 tablet 0   amiodarone (PACERONE) 200 MG tablet Take 200 mg by mouth daily.     apixaban (ELIQUIS) 5 MG TABS tablet Take 1 tablet (5 mg total) by mouth 2 (two) times daily. 60 tablet    blood glucose meter kit and supplies Dispense based on patient and insurance preference. Use up to four times daily as directed. (FOR ICD-10 E10.9, E11.9). 1 each 0   ciprofloxacin (CIPRO) 500 MG tablet Take 1 tablet (500 mg total) by mouth every 12 (twelve) hours. (Patient not taking: Reported on 02/13/2022) 10 tablet 0  diclofenac Sodium (VOLTAREN) 1 % GEL Apply 2 g topically in the morning and at bedtime.     ferrous sulfate 325 (65 FE) MG tablet Take 325 mg by mouth daily with breakfast.     fluticasone (FLONASE) 50 MCG/ACT nasal spray Place 1 spray into both nostrils 2 (two) times daily. (Patient not taking: Reported on 02/13/2022)     furosemide (LASIX) 40 MG tablet Take 1 tablet (40 mg total) by mouth daily. 30 tablet    gabapentin (NEURONTIN) 300 MG  capsule Take by mouth.     HYDROcodone-acetaminophen (NORCO) 7.5-325 MG tablet Take 1 tablet by mouth in the morning, at noon, in the evening, and at bedtime.     INCRUSE ELLIPTA 62.5 MCG/ACT AEPB Inhale 1 puff into the lungs daily. (Patient not taking: Reported on 02/13/2022)     ipratropium-albuterol (DUONEB) 0.5-2.5 (3) MG/3ML SOLN Take 3 mLs by nebulization every 6 (six) hours as needed (SOB).     levothyroxine (SYNTHROID) 25 MCG tablet Take 25 mcg by mouth daily.     metoprolol succinate (TOPROL XL) 25 MG 24 hr tablet Take 0.5 tablets (12.5 mg total) by mouth daily. (Patient not taking: Reported on 02/13/2022) 135 tablet 3   Multiple Vitamin (MULTIVITAMIN WITH MINERALS) TABS tablet Take 1 tablet by mouth daily. (Patient not taking: Reported on 02/13/2022)     nitrofurantoin, macrocrystal-monohydrate, (MACROBID) 100 MG capsule Take 1 capsule (100 mg total) by mouth every 12 (twelve) hours. 20 capsule 0   OXYGEN Inhale 2 L into the lungs as needed (Shortness of Breath).     pantoprazole (PROTONIX) 40 MG tablet Take 1 tablet (40 mg total) by mouth 2 (two) times daily. (Patient taking differently: Take 40 mg by mouth daily.) 60 tablet 2   polyethylene glycol (MIRALAX / GLYCOLAX) 17 g packet Take 17 g by mouth daily.     rosuvastatin (CRESTOR) 10 MG tablet Take 1 tablet (10 mg total) by mouth daily. 90 tablet 3   SPIRIVA HANDIHALER 18 MCG inhalation capsule Place 1 capsule into inhaler and inhale daily.     spironolactone (ALDACTONE) 25 MG tablet Take 0.5 tablets (12.5 mg total) by mouth daily.     tamsulosin (FLOMAX) 0.4 MG CAPS capsule Take 1 capsule (0.4 mg total) by mouth daily after supper. 30 capsule 0   venlafaxine XR (EFFEXOR-XR) 150 MG 24 hr capsule Take 1 capsule (150 mg total) by mouth daily with breakfast. 90 capsule 1   No current facility-administered medications for this visit.   Allergies:  Demerol [meperidine hcl] and Morphine and related   Social History: The patient  reports that  she has never smoked. She has never used smokeless tobacco. She reports that she does not currently use alcohol. She reports that she does not use drugs.   Family History: The patient's family history includes COPD in her brother; Heart disease in her father.   ROS:  Please see the history of present illness. Otherwise, complete review of systems is positive for none.  All other systems are reviewed and negative.   Physical Exam: VS:  Ht 5\' 5"  (1.651 m)   Wt 196 lb (88.9 kg) Comment: per pt-in wheelchair  SpO2 97%   BMI 32.62 kg/m , BMI Body mass index is 32.62 kg/m.  Wt Readings from Last 3 Encounters:  07/09/22 196 lb (88.9 kg)  02/13/22 192 lb (87.1 kg)  11/22/21 182 lb (82.6 kg)    General: Patient appears comfortable at rest. HEENT: Conjunctiva and lids normal,  oropharynx clear with moist mucosa. Neck: Supple, no elevated JVP or carotid bruits, no thyromegaly. Lungs: Clear to auscultation, nonlabored breathing at rest. Cardiac: Regular rate and rhythm, no S3 or significant systolic murmur, no pericardial rub. Abdomen: Soft, nontender, no hepatomegaly, bowel sounds present, no guarding or rebound. Extremities: No pitting edema, distal pulses 2+. Skin: Warm and dry. Musculoskeletal: No kyphosis. Neuropsychiatric: Alert and oriented x3, affect grossly appropriate.  ECG:  NSR  Recent Labwork: No results found for requested labs within last 365 days.  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  # Persistent A-fib (diagnosed in 3/23 ) s/p DCCV in 4/23, currently in NSR -EKG today shows NSR. Patient has been on amiodarone since 06/2021, will discontinue. 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 -Switch metoprolol succinate to tartrate 12.5 mg twice daily -Continue Spironolactone 25mg  once daily -ACE/ARB/ARNI not added due to soft Bps  #Pre-op cardiac risk stratification for R knee replacement -Patient does not want to undergo right knee surgery anymore due to her heart condition.  Currently she is undergoing steroid shots to her right knee joint.  I have spent a total of 33 minutes with patient reviewing chart, EKGs, labs and examining patient as well as establishing an assessment and plan that was discussed with the patient.  > 50% of time was spent in direct patient care.     Medication Adjustments/Labs and Tests Ordered: Current medicines are reviewed at length with the patient today.  Concerns regarding medicines are outlined above.   Tests Ordered: No orders of the defined types were placed in this encounter.   Medication Changes: Meds ordered this encounter  Medications   metoprolol tartrate (LOPRESSOR) 25 MG tablet    Sig: Take 0.5 tablets (12.5 mg total) by mouth 2 (two) times daily.    Dispense:  180 tablet    Refill:  3      Disposition:  Follow up  6 months  Signed, Annabelle Rexroad Verne Spurr, MD, 07/09/2022 1:39 PM     [facility] [address] [place], [place] [address]

## 2022-07-19 ENCOUNTER — Telehealth: Payer: Self-pay

## 2022-07-19 NOTE — Telephone Encounter (Signed)
PA is still pending for Orthovisc, bilateral knee. Tried [employer] plan, but advised that there closed. Refaxed PA form to [phone].

## 2022-07-19 NOTE — Telephone Encounter (Addendum)
Received a call from [facility] and was advised that patient was approved for Orthovisc, bilateral knee for 1 visit.  Representative stated that she will call back to let me know if authorization can be modified for the other 2 visits.   Please schedule 3 visits for Orthovisc, bilateral knee. All information has been added to patient's chart.

## 2022-07-22 NOTE — Telephone Encounter (Signed)
The patient is a resident at [facility].  I have called and let a message asking for a return call to schedule the patient for her Orthovisc, bilateral knee injections.  The patient will be responsible for 20% OOP, per Toniann Fail, this will be about $350 for al three injections.

## 2022-08-07 ENCOUNTER — Ambulatory Visit (HOSPITAL_COMMUNITY)
Admission: RE | Admit: 2022-08-07 | Discharge: 2022-08-07 | Disposition: A | Discharge status: Home / Self Care | Payer: Medicare (Managed Care) | Source: Ambulatory Visit | Attending: Internal Medicine | Admitting: Internal Medicine

## 2022-08-07 DIAGNOSIS — I428 Other cardiomyopathies: Secondary | ICD-10-CM | POA: Diagnosis not present

## 2022-08-07 DIAGNOSIS — I4891 Unspecified atrial fibrillation: Secondary | ICD-10-CM | POA: Diagnosis not present

## 2022-08-07 DIAGNOSIS — E119 Type 2 diabetes mellitus without complications: Secondary | ICD-10-CM | POA: Diagnosis not present

## 2022-08-07 DIAGNOSIS — I429 Cardiomyopathy, unspecified: Secondary | ICD-10-CM | POA: Insufficient documentation

## 2022-08-07 DIAGNOSIS — I509 Heart failure, unspecified: Secondary | ICD-10-CM | POA: Insufficient documentation

## 2022-08-07 LAB — ECHOCARDIOGRAM COMPLETE
AR max vel: 2.84 cm2
AV Area VTI: 2.84 cm2
AV Area mean vel: 2.56 cm2
AV Mean grad: 2.6 mmHg
AV Peak grad: 5.4 mmHg
Ao pk vel: 1.16 m/s
Area-P 1/2: 3.53 cm2
Calc EF: 61.8 %
S' Lateral: 2.8 cm
Single Plane A2C EF: 65.2 %
Single Plane A4C EF: 60.2 %

## 2022-08-07 NOTE — Progress Notes (Signed)
  Echocardiogram 2D Echocardiogram has been performed.  Janalyn Harder 08/07/2022, 2:55 PM

## 2022-08-20 ENCOUNTER — Telehealth: Payer: Self-pay

## 2022-08-20 DIAGNOSIS — I5189 Other ill-defined heart diseases: Secondary | ICD-10-CM

## 2022-08-20 NOTE — Telephone Encounter (Signed)
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  Obtain cardiac MRI for further evaluation.

## 2022-08-20 NOTE — Telephone Encounter (Signed)
Lab slip also faxed to [facility]

## 2022-08-20 NOTE — Telephone Encounter (Signed)
I spoke with Nurse Turkey at [facility] and discussed results of echocardiogram. I placed order for cardiac MRI and CBC. I will fax results to them at [phone]

## 2022-09-03 ENCOUNTER — Telehealth: Payer: Self-pay | Admitting: Internal Medicine

## 2022-09-03 ENCOUNTER — Encounter (HOSPITAL_COMMUNITY): Payer: Self-pay

## 2022-09-03 NOTE — Telephone Encounter (Signed)
Krystal Ali with [employer] states the date of service and facility NPI for MRI were not included on the authorization form. She would like correspondence with that information. Krystal Ali mentions that she will need direct contact information to someone in the office. She states that she cannot keep doing this.

## 2022-09-04 ENCOUNTER — Telehealth (HOSPITAL_COMMUNITY): Payer: Self-pay | Admitting: Emergency Medicine

## 2022-09-04 ENCOUNTER — Telehealth (HOSPITAL_COMMUNITY): Payer: Self-pay | Admitting: *Deleted

## 2022-09-04 NOTE — Telephone Encounter (Signed)
Staff from [facility] called to say the patient did not want to pursue her cardiac MRI. The staff member said that she tried to persuade the patient to come but still did not to despite knowing she could be given sedation. Cardiac MRI appointment canceled and I will send a message to ordering provider.  Larey Brick RN Navigator Cardiac Imaging [facility] [phone] Office [phone] Cell

## 2022-09-04 NOTE — Telephone Encounter (Signed)
Reaching out to patient to offer assistance regarding upcoming cardiac imaging study; pt verbalizes understanding of appt date/time, parking situation and where to check in, pre-test NPO status and medications ordered, and verified current allergies; name and call back number provided for further questions should they arise Rockwell Alexandria RN Navigator Cardiac Imaging [facility] [phone] office [phone] cell  Spoke with LTC caregiver about test and how to prepare

## 2022-09-05 ENCOUNTER — Ambulatory Visit (HOSPITAL_COMMUNITY): Admission: RE | Admit: 2022-09-05 | Payer: Medicare (Managed Care) | Source: Ambulatory Visit

## 2022-09-05 ENCOUNTER — Other Ambulatory Visit (HOSPITAL_COMMUNITY): Payer: Medicare (Managed Care)

## 2022-09-17 ENCOUNTER — Ambulatory Visit (HOSPITAL_COMMUNITY): Admission: RE | Admit: 2022-09-17 | Payer: Medicare (Managed Care) | Source: Ambulatory Visit

## 2022-10-03 ENCOUNTER — Telehealth: Payer: Self-pay | Admitting: Orthopaedic Surgery

## 2022-10-03 NOTE — Telephone Encounter (Signed)
Krystal Ali w/[facility] called and stated that the patient will not be getting the injections because she cannot pay the $350.

## 2022-11-13 ENCOUNTER — Ambulatory Visit: Payer: Medicare (Managed Care) | Admitting: Family

## 2023-01-06 NOTE — Progress Notes (Deleted)
Cardiology Office Note:  .   Date:  01/06/2023  ID:  Krystal Ali, DOB 1946-02-25, MRN [id number] PCP: Krystal Ali, Krystal Ali  [facility] Providers Cardiologist:  Krystal Sprague, MD (Inactive) { Click to update primary MD,subspecialty MD or APP then REFRESH:1}   History of Present Illness: .   Krystal Ali is a 77 y.o. 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  Patient was seen by Dr. Shari Prows in 9/23 for preop cardiac risk stratification for right knee replacement. NM stress test was ordered but never done because her daughter and nursing home doctor recommended the patient not get the surgery due to her heart condition.  Patient saw Dr. Jenene Slicker 06/2022 and echo updated. 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  ROS: ***  Studies Reviewed: Marland Kitchen         Prior CV Studies: {Select studies to display:26339}  ***  Risk Assessment/Calculations:   {Does this patient have ATRIAL FIBRILLATION?[phone]} No BP recorded.  {Refresh Note OR Click here to enter BP  :1}***       Physical Exam:   VS:  There were no vitals taken for this visit.   Wt Readings from Last 3 Encounters:  07/09/22 196 lb (88.9 kg)  02/13/22 192 lb (87.1 kg)  11/22/21 182 lb (82.6 kg)    GEN: Well nourished, well developed in no acute distress NECK: No JVD; No carotid bruits CARDIAC: ***RRR, no murmurs, rubs, gallops RESPIRATORY:  Clear to auscultation without rales, wheezing or rhonchi  ABDOMEN: Soft, non-tender, non-distended EXTREMITIES:  No edema; No deformity   ASSESSMENT AND PLAN: .    Persistent A-fib (diagnosed in 3/23 ) s/p DCCV in  4/23, currently in NSR -EKG today shows NSR. Patient has been on amiodarone since 06/2021, will discontinue. 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 stress test, cath, DCCV, TEE, etc)?   Press F2        :[id number]}  Dispo: ***  Signed, Jacolyn Reedy, PA-C

## 2023-01-08 ENCOUNTER — Ambulatory Visit: Payer: Medicare (Managed Care) | Admitting: Internal Medicine

## 2023-01-09 ENCOUNTER — Ambulatory Visit: Payer: Medicare (Managed Care) | Admitting: Internal Medicine

## 2023-01-20 ENCOUNTER — Ambulatory Visit: Payer: Medicare (Managed Care) | Admitting: Physician Assistant

## 2023-02-06 IMAGING — RF DG C-ARM 1-60 MIN
1 series · 2 of 2 positions shown · non-contrast
Comparison: Lumbar spine CT-06/08/2020

CLINICAL DATA: L4-S1 paraspinal fusion

EXAM:
LUMBAR SPINE - 2-3 VIEW; DG C-ARM 1-60 MIN
FLUOROSCOPY TIME:  10 seconds (5.9 mGy)

[Series 1: run · 2 of 2 slices shown]
[im 1/2]
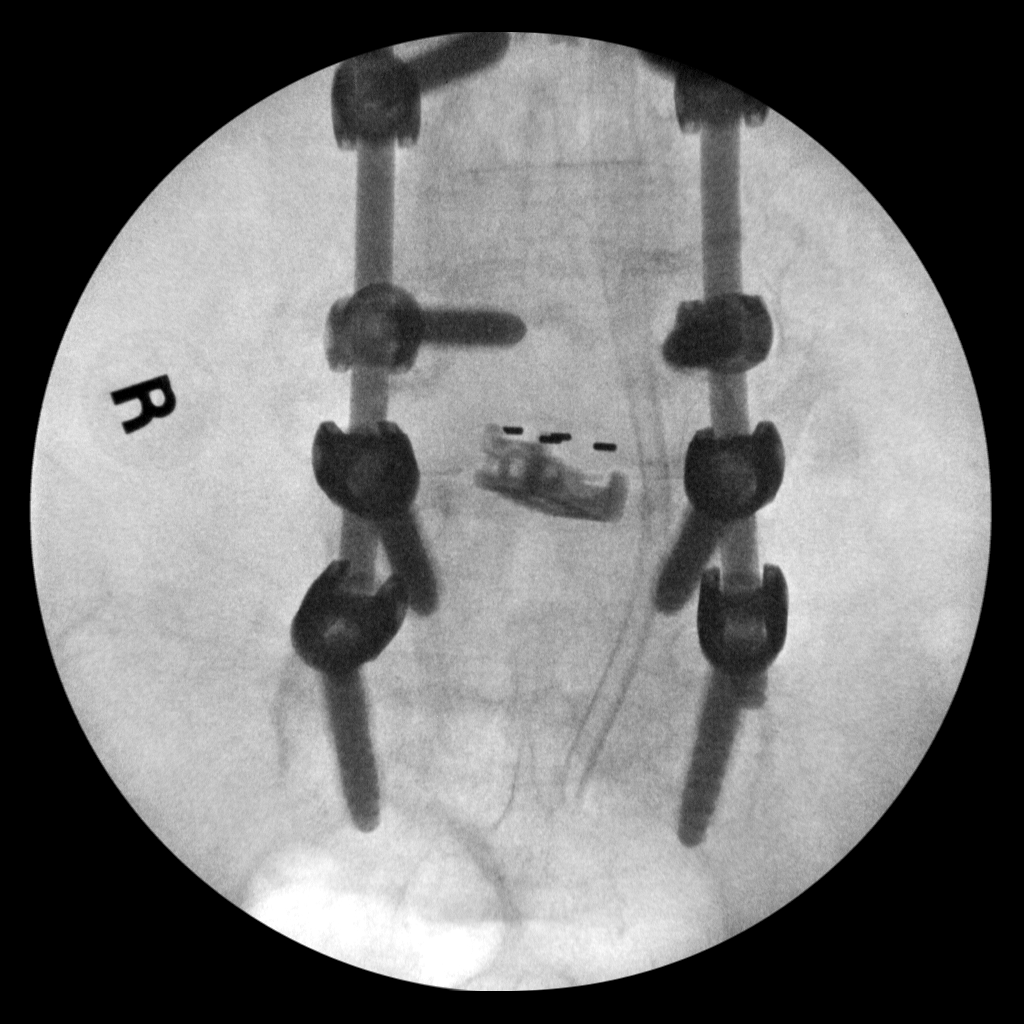
[im 2/2]
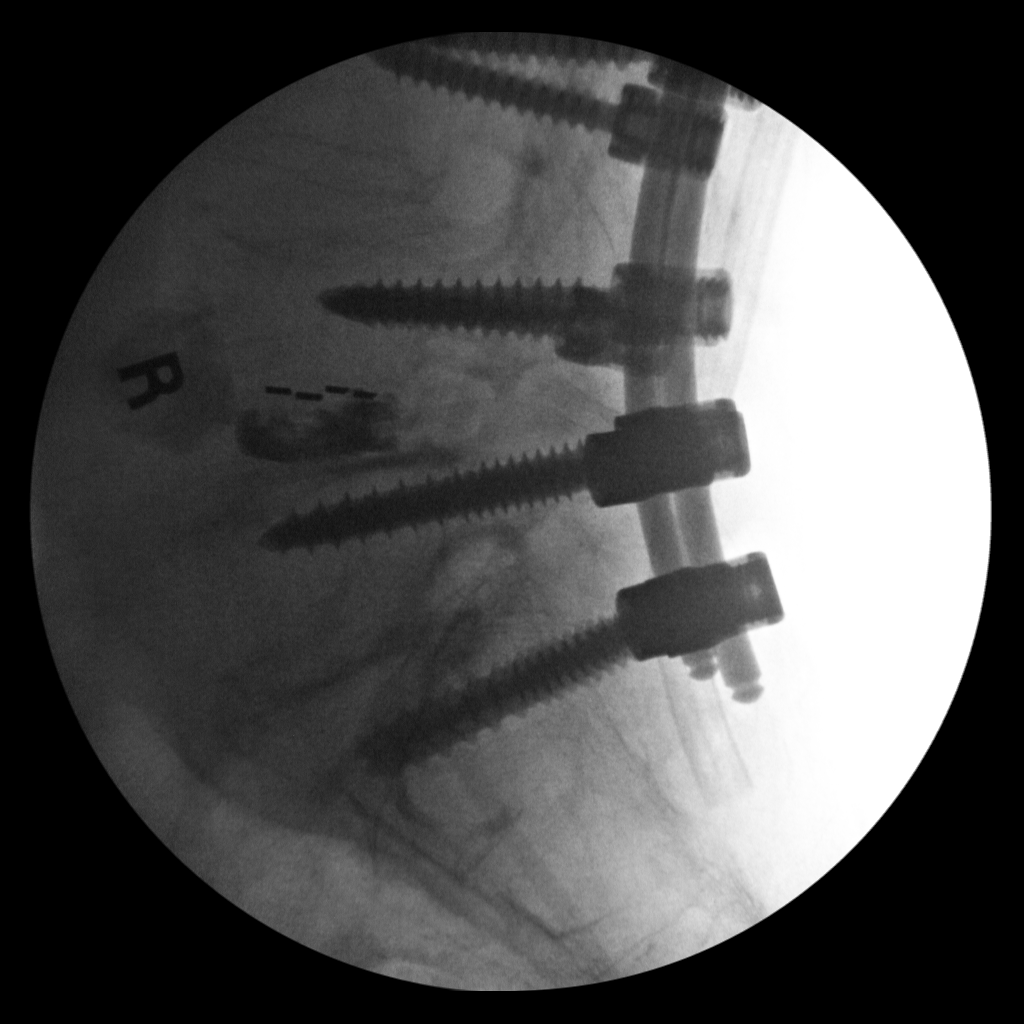

[2 of 2 positions shown; findings below may reference images not displayed]

FINDINGS: Two spot intraoperative fluoroscopic images of the lower lumbar
spine are provided for review. Spinal labeling is in keeping with
preprocedural lumbar spine CT performed 06/08/2020

Provided images demonstrate of caudal extension of pre-existing
paraspinal fusion hardware now with bilateral fusion at L5 and S1.
Post L4-L5 intervertebral disc space replacement.

Linear radiopaque structure posterior to the operative site is
presumably a surgical drainage catheter.
IMPRESSION: 1. Post L4-L5 intervertebral disc space replacement and inferior
extension of pre-existing paraspinal fusion hardware, now with
bilateral paraspinal fusion at L5-S1.
2. Presumed surgical drainage catheter noted posterior to the
operative site.

## 2023-03-03 ENCOUNTER — Encounter: Payer: Self-pay | Admitting: Nurse Practitioner

## 2023-03-03 ENCOUNTER — Ambulatory Visit: Payer: Medicare (Managed Care) | Attending: Nurse Practitioner | Admitting: Nurse Practitioner

## 2023-03-03 NOTE — Progress Notes (Deleted)
Office Visit    Patient Name: Krystal Ali Date of Encounter: 03/03/2023  Primary Care Provider:  Sherol Dade, DO Primary Cardiologist:  Krystal Sprague, MD (Inactive)  Chief Complaint    77 y.o. 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  Past Medical History  Subjective   Past Medical History:  Diagnosis Date   Anxiety    Diabetes mellitus without complication (HCC)    GERD (gastroesophageal reflux disease)    Heart failure with improved ejection fraction (HFimpEF) (HCC)    a. 05/2021 Echo (in setting of Afib): EF 20-25%, glob HK, RVSP 47.20mmHg; b. 09/2021 Echo: EF 45-50%, glob HK; c. 07/2022 Echo: EF 60-65%, no rwma, mild LVH, nl RV size/fxn, nl PASP, 4.2x2.5cm round, nonmobile echodensity attached to lat wall LA.   Hyperlipidemia    Hypertension    Left atrial mass    a. 07/2022 Echo: 4.2 x 2.5 cm round echodense nonmobile mass attached to the lateral wall of the LA-->cMRI recommended, pt declined.   Macrocytic anemia    Persistent atrial fibrillation (HCC)    a. Dx 05/2021; b. 06/2021 s/p DCCV (150J); c. CHA2DS2VASc = 6-->Eliquis.   Retention of urine, unspecified    Sleep apnea    Spinal stenosis of lumbar region without neurogenic claudication    Past Surgical History:  Procedure Laterality Date   APPENDECTOMY     BACK SURGERY  08/2020   BIOPSY  09/20/2020   Procedure: BIOPSY;  Surgeon: Krystal Frame, MD;  Location: [facility];  Service: Gastroenterology;;   BREAST SURGERY     CARDIOVERSION N/A 07/06/2021   Procedure: CARDIOVERSION;  Surgeon: Krystal Sidle, MD;  Location: [facility];  Service: Cardiovascular;  Laterality: N/A;   CHOLECYSTECTOMY     COLONOSCOPY WITH PROPOFOL N/A 09/20/2020   Procedure: COLONOSCOPY WITH PROPOFOL;   Surgeon: Krystal Frame, MD;  Location: [facility];  Service: Gastroenterology;  Laterality: N/A;   ESOPHAGOGASTRODUODENOSCOPY (EGD) WITH PROPOFOL N/A 09/20/2020   Procedure: ESOPHAGOGASTRODUODENOSCOPY (EGD) WITH PROPOFOL;  Surgeon: Krystal Frame, MD;  Location: [facility];  Service: Gastroenterology;  Laterality: N/A;   POLYPECTOMY  09/20/2020   Procedure: POLYPECTOMY;  Surgeon: Krystal Ali, Krystal Boom, MD;  Location: [facility];  Service: Gastroenterology;;    Allergies  Allergies  Allergen Reactions   Demerol [Meperidine Hcl]     Agitation   Morphine And Codeine     No reaction listed on nursing home record      History of Present Illness      77 y.o. y/o female with a history of persistent atrial fibrillation, chronic heart failure with improved ejection fraction, hypertension, diabetes, hyperlipidemia, LA mass, and sleep apnea.  She was initially diagnosed with atrial fibrillation in March 2023, when she was admitted from skilled nursing facility with worsening dyspnea.  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  She subsequently underwent cardioversion in April 2023 with restoration of sinus rhythm.  She was maintained on amiodarone.  Follow-up echo in July 2023 showed improvement in EF to 45 to 50%.  In September 2023, as part of preop risk stratification for right knee surgery, stress testing was ordered however, daughter did not follow through and subsequently never had right knee surgery.   At her most recent office  visit in April 2024, Krystal Ali was doing well.  A follow-up echocardiogram showed an EF of 60 to 65% with mild LVH and an incidental finding of a 4.2 x 2.5 cm echodensity attached to the lateral wall of the left atrium.  Cardiac MRI was recommended however, patient declined. Objective  Home Medications    Current Outpatient Medications  Medication Sig Dispense Refill   acetaminophen  (TYLENOL) 325 MG tablet Take 2 tablets (650 mg total) by mouth every 6 (six) hours as needed for mild pain (or Fever >/= 101). (Patient taking differently: Take 650 mg by mouth every 6 (six) hours as needed for mild pain (or Fever >/= 101). Do not exceed 3,000 mg in 24 hrs) 12 tablet 0   apixaban (ELIQUIS) 5 MG TABS tablet Take 1 tablet (5 mg total) by mouth 2 (two) times daily. 60 tablet    blood glucose meter kit and supplies Dispense based on patient and insurance preference. Use up to four times daily as directed. (FOR ICD-10 E10.9, E11.9). 1 each 0   ciprofloxacin (CIPRO) 500 MG tablet Take 1 tablet (500 mg total) by mouth every 12 (twelve) hours. 10 tablet 0   diclofenac Sodium (VOLTAREN) 1 % GEL Apply 2 g topically in the morning and at bedtime.     ferrous sulfate 325 (65 FE) MG tablet Take 325 mg by mouth daily with breakfast.     fluticasone (FLONASE) 50 MCG/ACT nasal spray Place 1 spray into both nostrils 2 (two) times daily.     furosemide (LASIX) 40 MG tablet Take 1 tablet (40 mg total) by mouth daily. 30 tablet    gabapentin (NEURONTIN) 300 MG capsule Take by mouth.     HYDROcodone-acetaminophen (NORCO) 7.5-325 MG tablet Take 1 tablet by mouth in the morning, at noon, in the evening, and at bedtime.     INCRUSE ELLIPTA 62.5 MCG/ACT AEPB Inhale 1 puff into the lungs daily.     ipratropium-albuterol (DUONEB) 0.5-2.5 (3) MG/3ML SOLN Take 3 mLs by nebulization every 6 (six) hours as needed (SOB).     levothyroxine (SYNTHROID) 25 MCG tablet Take 25 mcg by mouth daily.     metoprolol tartrate (LOPRESSOR) 25 MG tablet Take 0.5 tablets (12.5 mg total) by mouth 2 (two) times daily. 180 tablet 3   Multiple Vitamin (MULTIVITAMIN WITH MINERALS) TABS tablet Take 1 tablet by mouth daily.     nitrofurantoin, macrocrystal-monohydrate, (MACROBID) 100 MG capsule Take 1 capsule (100 mg total) by mouth every 12 (twelve) hours. 20 capsule 0   OXYGEN Inhale 2 L into the lungs as needed (Shortness of Breath).      pantoprazole (PROTONIX) 40 MG tablet Take 1 tablet (40 mg total) by mouth 2 (two) times daily. (Patient taking differently: Take 40 mg by mouth daily.) 60 tablet 2   polyethylene glycol (MIRALAX / GLYCOLAX) 17 g packet Take 17 g by mouth daily.     rosuvastatin (CRESTOR) 10 MG tablet Take 1 tablet (10 mg total) by mouth daily. 90 tablet 3   SPIRIVA HANDIHALER 18 MCG inhalation capsule Place 1 capsule into inhaler and inhale daily.     spironolactone (ALDACTONE) 25 MG tablet Take 0.5 tablets (12.5 mg total) by mouth daily.     tamsulosin (FLOMAX) 0.4 MG CAPS capsule Take 1 capsule (0.4 mg total) by mouth daily after supper. 30 capsule 0   venlafaxine XR (EFFEXOR-XR) 150 MG 24 hr capsule Take 1 capsule (150 mg total) by mouth daily with breakfast. 90 capsule 1  No current facility-administered medications for this visit.     Physical Exam    VS:  There were no vitals taken for this visit. , BMI There is no height or weight on file to calculate BMI.       GEN: Well nourished, well developed, in no acute distress. HEENT: normal. Neck: Supple, no JVD, carotid bruits, or masses. Cardiac: RRR, no murmurs, rubs, or gallops. No clubbing, cyanosis, edema.  Radials 2+/PT 2+ and equal bilaterally.  Respiratory:  Respirations regular and unlabored, clear to auscultation bilaterally. GI: Soft, nontender, nondistended, BS + x 4. MS: no deformity or atrophy. Skin: warm and dry, no rash. Neuro:  Strength and sensation are intact. Psych: Normal affect.  Accessory Clinical Findings    ECG personally reviewed by me today -    *** - no acute changes.  Lab Results  Component Value Date   WBC 4.2 07/07/2021   HGB 9.7 (L) 07/07/2021   HCT 29.3 (L) 07/07/2021   MCV 98.3 07/07/2021   PLT 170 07/07/2021   Lab Results  Component Value Date   CREATININE 1.15 (H) 07/07/2021   BUN 46 (H) 07/07/2021   NA 135 07/07/2021   K 3.8 07/07/2021   CL 99 07/07/2021   CO2 29 07/07/2021   Lab Results   Component Value Date   ALT 15 07/07/2021   AST 16 07/07/2021   ALKPHOS 70 07/07/2021   BILITOT 0.7 07/07/2021   No results found for: "CHOL", "HDL", "LDLCALC", "LDLDIRECT", "TRIG", "CHOLHDL"  Lab Results  Component Value Date   HGBA1C 6.0 (H) 07/04/2021   Lab Results  Component Value Date   TSH 2.946 05/21/2021       Assessment & Plan    1.  ***  Nicolasa Ducking, NP 03/03/2023, 2:25 PM

## 2023-03-12 IMAGING — CT CT ABD-PEL WO/W CM
3 of 12 series · 10 of 46 positions shown, 16 images · IV contrast (omnipaque)
Comparison: Limited correlation made with CT lumbar spine
06/08/2020.

CLINICAL DATA: Hematuria for 6-7 weeks with rectal bleeding.
History of appendectomy, cholecystectomy and diabetes.

EXAM:
CT ABDOMEN AND PELVIS WITHOUT AND WITH CONTRAST
TECHNIQUE: Multidetector CT imaging of the abdomen and pelvis was performed
following the standard protocol before and following the bolus
administration of intravenous contrast.
CONTRAST:  100 mL OMNIPAQUE IOHEXOL 300 MG/ML  SOLN

[Series 6: coronal pre · coronal · non-contrast · 0.73mm/px · 2 of 117 slices shown, 3 images]
[im 39/117  soft-tissue]
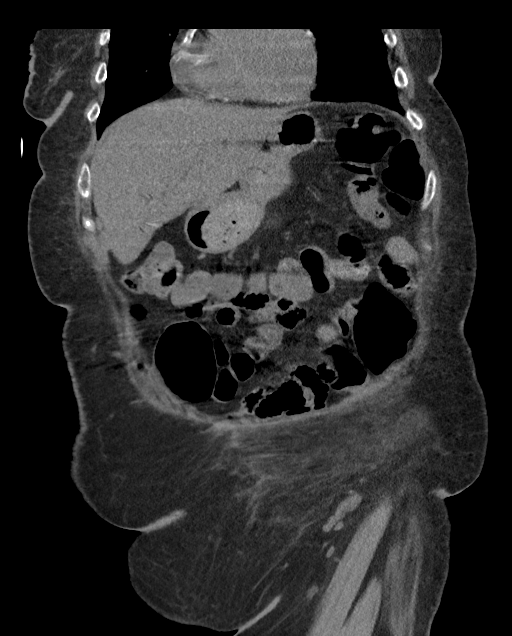
[im 39/117  bone]
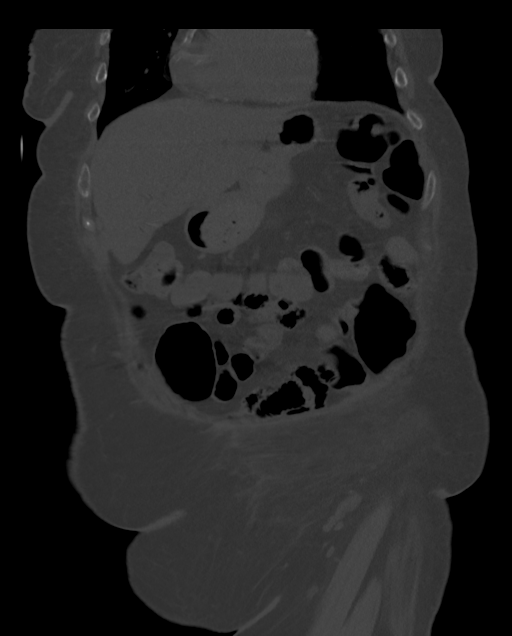
[im 78/117  soft-tissue]
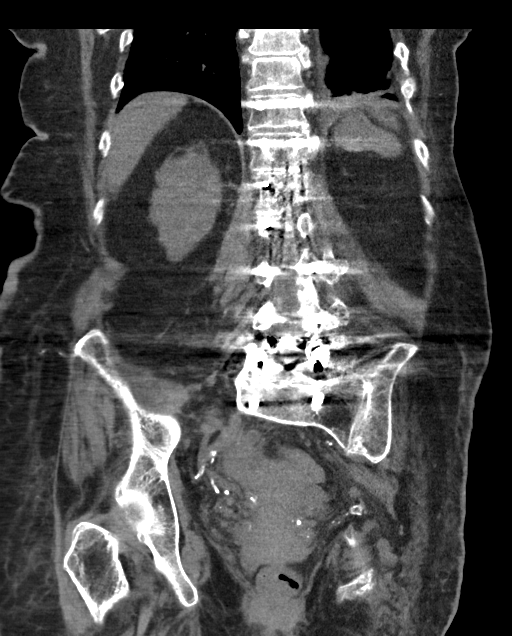

[Series 8: axial post · axial · 0.84mm/px · z∈[+1053,+1323]mm · 4 of 90 slices shown, 9 images]
[im 18/90  soft-tissue]
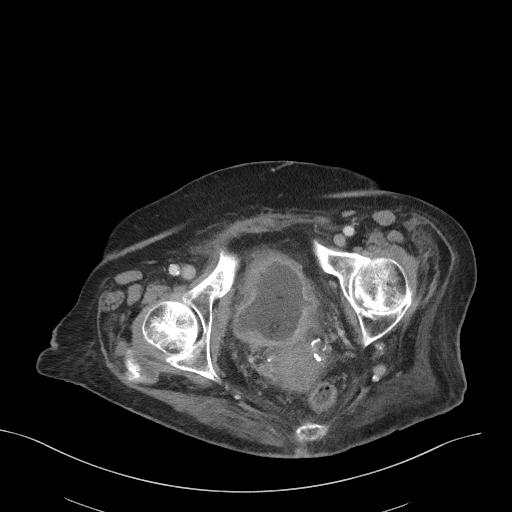
[im 18/90  lung]
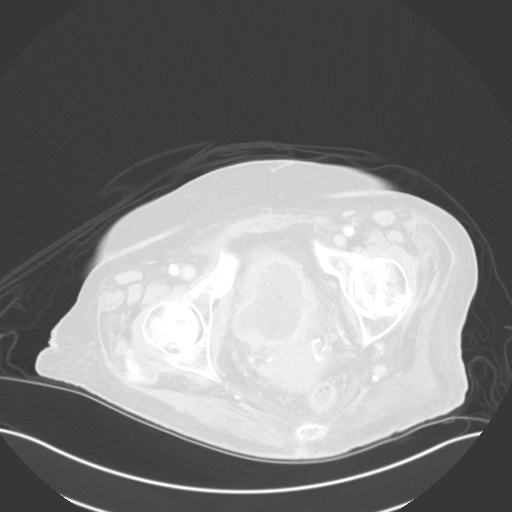
[im 18/90  bone]
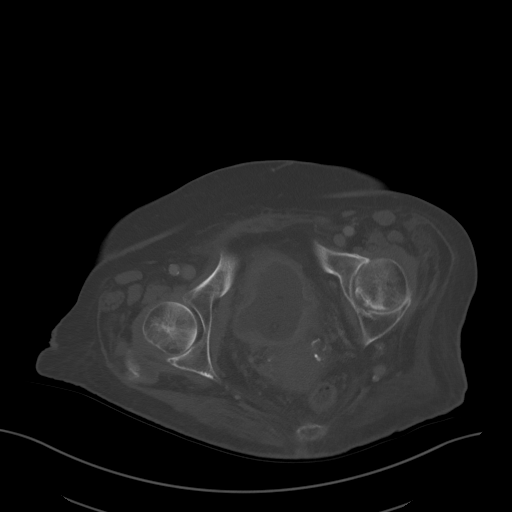
[im 36/90  soft-tissue]
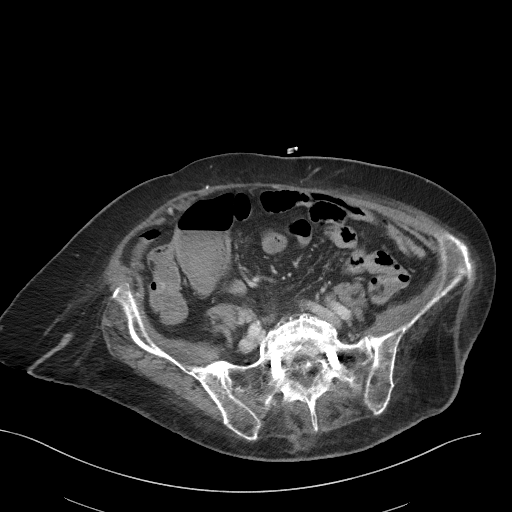
[im 36/90  lung]
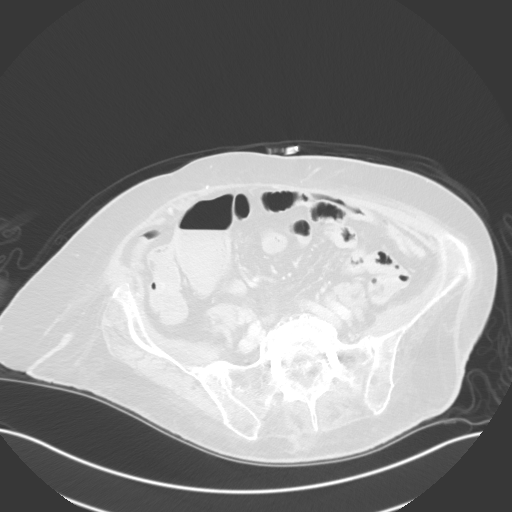
[im 54/90  soft-tissue]
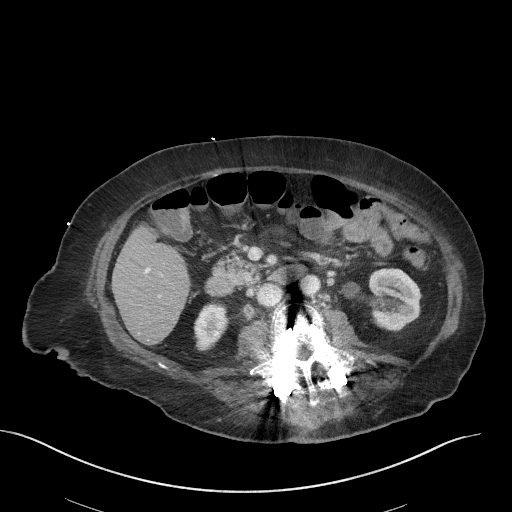
[im 54/90  lung]
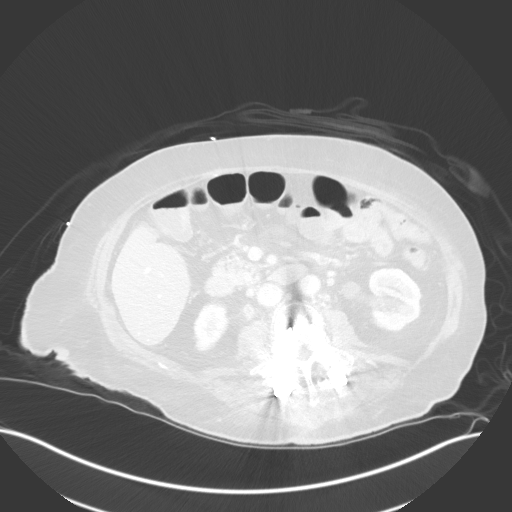
[im 72/90  soft-tissue]
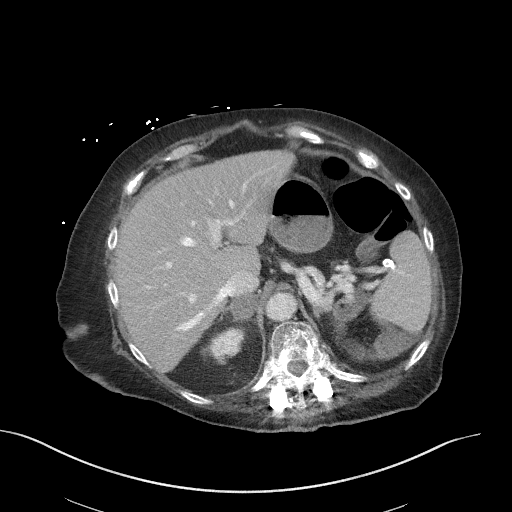
[im 72/90  lung]
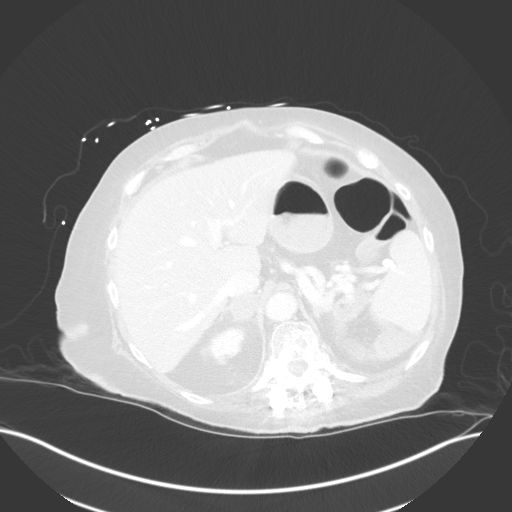

[Series 13: axial delay · axial · delayed · 0.84mm/px · z∈[+1053,+1323]mm · 4 of 90 slices shown]
[im 18/90  soft-tissue]
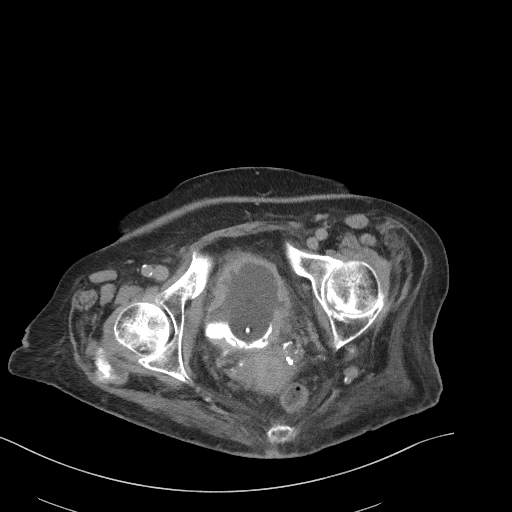
[im 36/90  soft-tissue]
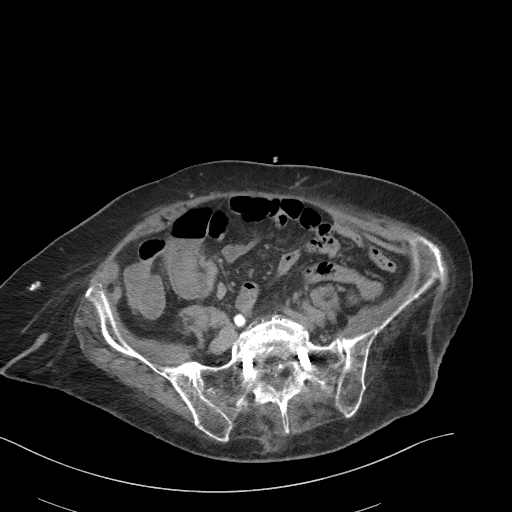
[im 54/90  soft-tissue]
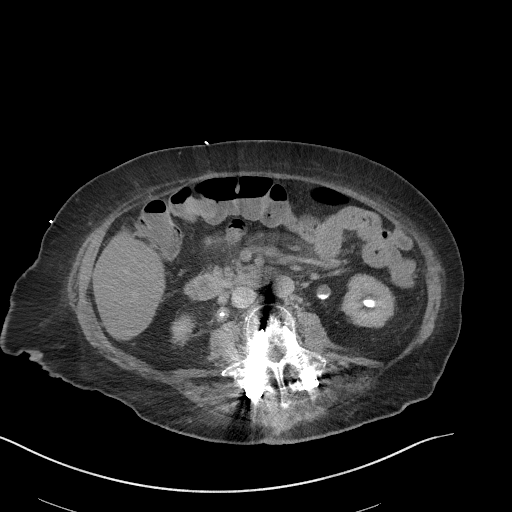
[im 72/90  soft-tissue]
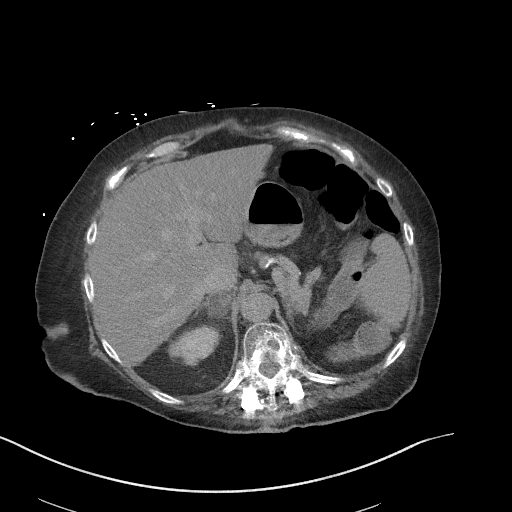

[10 of 46 positions shown; findings below may reference images not displayed]

FINDINGS: Lower chest: Mild dependent atelectasis or scarring at both lung
bases with a trace left pleural effusion. There is a moderate size
hiatal hernia. Diffuse coronary artery atherosclerosis noted.

Hepatobiliary: Pre contrast images demonstrate decreased hepatic
density consistent with steatosis. There are scattered small
calcified granulomas. Following contrast, no suspicious focal lesion
or abnormal enhancement. No significant biliary dilatation status
post cholecystectomy.

Pancreas: Unremarkable. No pancreatic ductal dilatation or
surrounding inflammatory changes.

Spleen: Scattered small calcified granulomas. No splenomegaly or
other focal abnormality.

Adrenals/Urinary Tract: There is a right adrenal adenoma measuring
2.0 cm and demonstrating a density of -4 HU. The left adrenal gland
appears normal. Pre-contrast images demonstrate no renal, ureteral
or bladder calculi. Post-contrast, both kidneys enhance normally.
There is no evidence of enhancing renal mass. There is mild
left-sided pelvicaliectasis without evidence ureteral obstruction or
focal urothelial lesion. On the right, there is irregular wall
thickening and enhancement of the renal pelvis and proximal ureter
without evidence of obstruction. Foley catheter is in place. There
is diffuse bladder wall thickening. There is dependent high density
within the bladder lumen on the pre contrast images, likely
reflecting blood. No focal bladder lesion identified.

Stomach/Bowel: No enteric contrast administered. As above,
moderate-size hiatal hernia. Probable postsurgical changes at the
cecum related to previous appendectomy. No evidence of bowel wall
thickening, distention or surrounding inflammation.

Vascular/Lymphatic: There are no enlarged abdominal or pelvic lymph
nodes. Mild aortic and branch vessel atherosclerosis without acute
vascular findings. The portal, superior mesenteric and splenic veins
are patent.

Reproductive: There is moderate distension of the endometrial cavity
to 3.7 cm. No focal endometrial or cervical mass identified. No
adnexal mass.

Other: There is soft tissue stranding in the base of the mesentery,
inferior to pancreas. No focal fluid collection. No ascites or free
intraperitoneal air.

Musculoskeletal: No acute or significant osseous findings. Extensive
postsurgical changes throughout the thoracolumbar spine status post
fusion from T11 through the upper sacrum. Compared with the prior
CT, the fusion has been extended inferiorly from the L4 level.
Partial corpectomy and strut graft extending from T12 to L3 appear
unchanged. There is discontinuity of both posterior spinal rods at
L1 on the right and L2 on the left, new from previous examination
and presumably iatrogenic, related to the interval extension of the
fusion. Degenerative changes are present at both hips.
IMPRESSION: 1. Abnormal wall thickening and enhancement of the right renal
pelvis and proximal ureter without apparent focal urothelial lesion
or obstruction. This could reflect inflammation or neoplasm. No
evidence of urinary tract calculus.
2. Probable hemorrhage within the bladder lumen with diffuse
nonspecific bladder wall thickening. Consider cystoscopy.
3. Abnormal distension of the endometrial cavity suspicious for
cervical or endometrial malignancy. Gynecology evaluation
recommended.
4. Interval inferior extension of the thoracolumbar fusion to the
upper sacrum with further discussion as above.
5. Additional incidental findings including evidence of prior
granulomatous disease, coronary atherosclerosis, a moderate size
hiatal hernia and nonspecific soft tissue stranding in the
mesenteric fat.

## 2023-04-04 ENCOUNTER — Emergency Department (HOSPITAL_COMMUNITY): Payer: Medicare (Managed Care)

## 2023-04-04 ENCOUNTER — Emergency Department (HOSPITAL_COMMUNITY)
Admission: EM | Admit: 2023-04-04 | Discharge: 2023-04-05 | Disposition: A | Discharge status: Home / Self Care | Payer: Medicare (Managed Care) | Attending: Emergency Medicine | Admitting: Emergency Medicine

## 2023-04-04 DIAGNOSIS — R799 Abnormal finding of blood chemistry, unspecified: Secondary | ICD-10-CM | POA: Diagnosis not present

## 2023-04-04 DIAGNOSIS — Z79899 Other long term (current) drug therapy: Secondary | ICD-10-CM | POA: Insufficient documentation

## 2023-04-04 DIAGNOSIS — I4891 Unspecified atrial fibrillation: Secondary | ICD-10-CM | POA: Diagnosis not present

## 2023-04-04 DIAGNOSIS — R479 Unspecified speech disturbances: Secondary | ICD-10-CM | POA: Insufficient documentation

## 2023-04-04 DIAGNOSIS — R471 Dysarthria and anarthria: Secondary | ICD-10-CM

## 2023-04-04 DIAGNOSIS — Z7901 Long term (current) use of anticoagulants: Secondary | ICD-10-CM | POA: Diagnosis not present

## 2023-04-04 LAB — APTT: aPTT: 31 s (ref 24–36)

## 2023-04-04 LAB — DIFFERENTIAL
Abs Immature Granulocytes: 0.03 10*3/uL (ref 0.00–0.07)
Basophils Absolute: 0 10*3/uL (ref 0.0–0.1)
Basophils Relative: 0 %
Eosinophils Absolute: 0.1 10*3/uL (ref 0.0–0.5)
Eosinophils Relative: 2 %
Immature Granulocytes: 0 %
Lymphocytes Relative: 27 %
Lymphs Abs: 2 10*3/uL (ref 0.7–4.0)
Monocytes Absolute: 0.6 10*3/uL (ref 0.1–1.0)
Monocytes Relative: 8 %
Neutro Abs: 4.7 10*3/uL (ref 1.7–7.7)
Neutrophils Relative %: 63 %

## 2023-04-04 LAB — COMPREHENSIVE METABOLIC PANEL
ALT: 12 U/L (ref 0–44)
AST: 15 U/L (ref 15–41)
Albumin: 3.6 g/dL (ref 3.5–5.0)
Alkaline Phosphatase: 61 U/L (ref 38–126)
Anion gap: 12 (ref 5–15)
BUN: 27 mg/dL — ABNORMAL HIGH (ref 8–23)
CO2: 28 mmol/L (ref 22–32)
Calcium: 9 mg/dL (ref 8.9–10.3)
Chloride: 98 mmol/L (ref 98–111)
Creatinine, Ser: 0.88 mg/dL (ref 0.44–1.00)
GFR, Estimated: >60 mL/min (ref >60)
Glucose, Bld: 163 mg/dL — ABNORMAL HIGH (ref 70–99)
Potassium: 3.5 mmol/L (ref 3.5–5.1)
Sodium: 138 mmol/L (ref 135–145)
Total Bilirubin: 0.5 mg/dL (ref 0.0–1.2)
Total Protein: 6.6 g/dL (ref 6.5–8.1)

## 2023-04-04 LAB — CBC
HCT: 33.9 % — ABNORMAL LOW (ref 36.0–46.0)
Hemoglobin: 11.1 g/dL — ABNORMAL LOW (ref 12.0–15.0)
MCH: 31.6 pg (ref 26.0–34.0)
MCHC: 32.7 g/dL (ref 30.0–36.0)
MCV: 96.6 fL (ref 80.0–100.0)
Platelets: 222 10*3/uL (ref 150–400)
RBC: 3.51 MIL/uL — ABNORMAL LOW (ref 3.87–5.11)
RDW: 13.1 % (ref 11.5–15.5)
WBC: 7.5 10*3/uL (ref 4.0–10.5)
nRBC: 0 % (ref 0.0–0.2)

## 2023-04-04 LAB — PROTIME-INR
INR: 1.3 — ABNORMAL HIGH (ref 0.8–1.2)
Prothrombin Time: 15.9 s — ABNORMAL HIGH (ref 11.4–15.2)

## 2023-04-04 LAB — ETHANOL: Alcohol, Ethyl (B): <10 mg/dL (ref <10)

## 2023-04-04 LAB — CBG MONITORING, ED: Glucose-Capillary: 180 mg/dL — ABNORMAL HIGH (ref 70–99)

## 2023-04-04 NOTE — Progress Notes (Signed)
Stroke Alert cart activation at 1439. [facility] Neurologist paged at 1440 and Dr Milon Dikes joins telemedicine cart at [phone]. ED provider, Dr Gerhard Munch, at bedside during Neurologist exam/interview 1447. Patient to CT department at 1450. Telestroke RN call to [place] [facility] and spoke to Chamita who states last does of eliquis 1026 today, patient full assist to sit up in bed and mechanical lift to wheelchair// MRS 5, LKW potentially prior to 1130 as a visitor came at 0900 who noticed difference in speech but did not report to facility staff at that time. Neurologist, updated of this information. TSRN off cart at 1448. Telestroke Charity fundraiser, Wilhelmenia Blase

## 2023-04-04 NOTE — ED Notes (Addendum)
CODE STROKE PAGED OUT 1440

## 2023-04-04 NOTE — Discharge Instructions (Signed)
Continue taking your medicines as you have been taking them.  Follow-up with your doctor next week

## 2023-04-04 NOTE — Consult Note (Signed)
[facility] Telemedicine Consult   Requesting Provider: Dr. Adalberto Cole Consult Participants: Dr. Marthe Patch, Telespecialist RN Elease Hashimoto   bedside RN Bonita Quin Location of the provider: [facility] Location of the patient: [facility] ED  This consult was provided via telemedicine with 2-way video and audio communication. The patient/family was informed that care would be provided in this way and agreed to receive care in this manner.   Chief Complaint: Worsening slurred speech  HPI: 78 year old woman with history of permanent atrial fibrillation on Eliquis, last dose likely this morning, hypertension, hyperlipidemia, NICM with heart failure-systolic-LVEF improved from 20 to 25% in March 2020, and improved to - 45 to 50% in July 2023.  She was brought in from her facility for evaluation of worsening slurred speech.  She says that she has been having slurred speech and word finding difficulty since this morning.  She feels that her speech is baseline now.  There was also question of left facial droop but the EMTs were not very clear about when that happened and how long that lasted. Patient reports that her granddaughter had come in and she was not able to talk to her normally. No history of strokes documented.   Later history obtained by the stroke response nurse from the facility-family member noticed around 9 AM that her speech was not normal.  She did not talk to anybody at the facility but facility later noticed some worsening slurred speech and call the family was that another member had noted this earlier at 58 AM.  Last known well date EMS was 64 AM but upon family conversations-last known well is 9 AM  Past Medical History:  Diagnosis Date   Anxiety    Diabetes mellitus without complication (HCC)    GERD (gastroesophageal reflux disease)    Heart failure with improved ejection fraction (HFimpEF) (HCC)    a. 05/2021 Echo (in setting of Afib): EF 20-25%, glob HK, RVSP  47.96mmHg; b. 09/2021 Echo: EF 45-50%, glob HK; c. 07/2022 Echo: EF 60-65%, no rwma, mild LVH, nl RV size/fxn, nl PASP, 4.2x2.5cm round, nonmobile echodensity attached to lat wall LA.   Hyperlipidemia    Hypertension    Left atrial mass    a. 07/2022 Echo: 4.2 x 2.5 cm round echodense nonmobile mass attached to the lateral wall of the LA-->cMRI recommended, pt declined.   Macrocytic anemia    Persistent atrial fibrillation (HCC)    a. Dx 05/2021; b. 06/2021 s/p DCCV (150J); c. CHA2DS2VASc = 6-->Eliquis.   Retention of urine, unspecified    Sleep apnea    Spinal stenosis of lumbar region without neurogenic claudication     No current facility-administered medications for this encounter.  Current Outpatient Medications:    acetaminophen (TYLENOL) 325 MG tablet, Take 2 tablets (650 mg total) by mouth every 6 (six) hours as needed for mild pain (or Fever >/= 101). (Patient taking differently: Take 650 mg by mouth every 6 (six) hours as needed for mild pain (or Fever >/= 101). Do not exceed 3,000 mg in 24 hrs), Disp: 12 tablet, Rfl: 0   apixaban (ELIQUIS) 5 MG TABS tablet, Take 1 tablet (5 mg total) by mouth 2 (two) times daily., Disp: 60 tablet, Rfl:    blood glucose meter kit and supplies, Dispense based on patient and insurance preference. Use up to four times daily as directed. (FOR ICD-10 E10.9, E11.9)., Disp: 1 each, Rfl: 0   ciprofloxacin (CIPRO) 500 MG tablet, Take 1 tablet (500 mg total) by  mouth every 12 (twelve) hours., Disp: 10 tablet, Rfl: 0   diclofenac Sodium (VOLTAREN) 1 % GEL, Apply 2 g topically in the morning and at bedtime., Disp: , Rfl:    ferrous sulfate 325 (65 FE) MG tablet, Take 325 mg by mouth daily with breakfast., Disp: , Rfl:    fluticasone (FLONASE) 50 MCG/ACT nasal spray, Place 1 spray into both nostrils 2 (two) times daily., Disp: , Rfl:    furosemide (LASIX) 40 MG tablet, Take 1 tablet (40 mg total) by mouth daily., Disp: 30 tablet, Rfl:    gabapentin (NEURONTIN) 300 MG  capsule, Take by mouth., Disp: , Rfl:    HYDROcodone-acetaminophen (NORCO) 7.5-325 MG tablet, Take 1 tablet by mouth in the morning, at noon, in the evening, and at bedtime., Disp: , Rfl:    INCRUSE ELLIPTA 62.5 MCG/ACT AEPB, Inhale 1 puff into the lungs daily., Disp: , Rfl:    ipratropium-albuterol (DUONEB) 0.5-2.5 (3) MG/3ML SOLN, Take 3 mLs by nebulization every 6 (six) hours as needed (SOB)., Disp: , Rfl:    levothyroxine (SYNTHROID) 25 MCG tablet, Take 25 mcg by mouth daily., Disp: , Rfl:    metoprolol tartrate (LOPRESSOR) 25 MG tablet, Take 0.5 tablets (12.5 mg total) by mouth 2 (two) times daily., Disp: 180 tablet, Rfl: 3   Multiple Vitamin (MULTIVITAMIN WITH MINERALS) TABS tablet, Take 1 tablet by mouth daily., Disp: , Rfl:    nitrofurantoin, macrocrystal-monohydrate, (MACROBID) 100 MG capsule, Take 1 capsule (100 mg total) by mouth every 12 (twelve) hours., Disp: 20 capsule, Rfl: 0   OXYGEN, Inhale 2 L into the lungs as needed (Shortness of Breath)., Disp: , Rfl:    pantoprazole (PROTONIX) 40 MG tablet, Take 1 tablet (40 mg total) by mouth 2 (two) times daily. (Patient taking differently: Take 40 mg by mouth daily.), Disp: 60 tablet, Rfl: 2   polyethylene glycol (MIRALAX / GLYCOLAX) 17 g packet, Take 17 g by mouth daily., Disp: , Rfl:    rosuvastatin (CRESTOR) 10 MG tablet, Take 1 tablet (10 mg total) by mouth daily., Disp: 90 tablet, Rfl: 3   SPIRIVA HANDIHALER 18 MCG inhalation capsule, Place 1 capsule into inhaler and inhale daily., Disp: , Rfl:    spironolactone (ALDACTONE) 25 MG tablet, Take 0.5 tablets (12.5 mg total) by mouth daily., Disp: , Rfl:    tamsulosin (FLOMAX) 0.4 MG CAPS capsule, Take 1 capsule (0.4 mg total) by mouth daily after supper., Disp: 30 capsule, Rfl: 0   venlafaxine XR (EFFEXOR-XR) 150 MG 24 hr capsule, Take 1 capsule (150 mg total) by mouth daily with breakfast., Disp: 90 capsule, Rfl: 1    LKW: 9 AM per family, 11 AM per EMS IV thrombolysis given?: No, on  Eliquis IR Thrombectomy? No, poor baseline Modified Rankin Scale: 4-Needs assistance to walk and tend to bodily needs, maybe 5. Time of teleneurologist evaluation: 1441 hrs.  Exam: Vitals:   04/04/23 1507 04/04/23 1511  BP:    Pulse:  72  Resp:  19  Temp: (!) 97.5 F (36.4 C)   SpO2:  96%    General: Awake alert in no distress Neurological exam Awake alert in no distress.  Oriented to person place and time.  Able to tell me her age correctly.  Able to tell me the month correctly.  Mild to moderate dysarthria-probably some component of edentulousness contributing as well..  Has a full conversation without aphasia.  No cranial nerve deficits.  Bilateral upper extremities antigravity.  Lower extremity examination reveals weakness-barely 3/5  on the left lower extremity and 4 -/5 on the right lower extremity with drift in both.  Sensation intact.  No gross dysmetria.  NIHSS 1A: Level of Consciousness - 0 1B: Ask Month and Age - 0 1C: 'Blink Eyes' & 'Squeeze Hands' - 0 2: Test Horizontal Extraocular Movements - 0 3: Test Visual Fields - 0 4: Test Facial Palsy - 0 5A: Test Left Arm Motor Drift - 0 5B: Test Right Arm Motor Drift - 0 6A: Test Left Leg Motor Drift - 1 6B: Test Right Leg Motor Drift - 1 7: Test Limb Ataxia - 0 8: Test Sensation - 0 9: Test Language/Aphasia- 0 10: Test Dysarthria - 1 11: Test Extinction/Inattention - 0 NIHSS score: 3   Imaging Reviewed: CT head without contrast-small area of calcification versus small hemorrhage noted in the corona radiata in the periventricular area.  Looking at the area surrounding it from maybe prior stroke and cortical laminar necrosis, this likely is calcification but a bleed cannot be ruled out   Labs reviewed in epic and pertinent values follow: CBC    Component Value Date/Time   WBC 4.2 07/07/2021 1833   RBC 2.98 (L) 07/07/2021 1833   HGB 9.7 (L) 07/07/2021 1833   HCT 29.3 (L) 07/07/2021 1833   PLT 170 07/07/2021 1833    MCV 98.3 07/07/2021 1833   MCH 32.6 07/07/2021 1833   MCHC 33.1 07/07/2021 1833   RDW 13.3 07/07/2021 1833   LYMPHSABS 1.7 07/07/2021 1833   MONOABS 0.4 07/07/2021 1833   EOSABS 0.1 07/07/2021 1833   BASOSABS 0.0 07/07/2021 1833   CMP     Component Value Date/Time   NA 135 07/07/2021 1833   K 3.8 07/07/2021 1833   CL 99 07/07/2021 1833   CO2 29 07/07/2021 1833   GLUCOSE 79 07/07/2021 1833   BUN 46 (H) 07/07/2021 1833   CREATININE 1.15 (H) 07/07/2021 1833   CALCIUM 8.5 (L) 07/07/2021 1833   PROT 6.5 07/07/2021 1833   ALBUMIN 3.5 07/07/2021 1833   AST 16 07/07/2021 1833   ALT 15 07/07/2021 1833   ALKPHOS 70 07/07/2021 1833   BILITOT 0.7 07/07/2021 1833   GFRNONAA 50 (L) 07/07/2021 1833     Assessment: 78 year old history of permanent atrial fibrillation on Eliquis amongst other comorbidities brought into the hospital for evaluation for worsening slurred speech.  Initial report last known well 11 but family reports that at 9 AM this morning, she was more slurred than usual.  They did not really notify anybody till the facility called the family. She is at baseline extremely disabled, has not walked in a couple of years and is essentially bedbound. Not a candidate for thrombectomy or IV thrombolysis if this is an ischemic stroke. That said, when the CT head was done, there is a small area of hyperdensity in the left hemisphere which likely represents a degenerative calcification or cortical laminar necrosis but a small bleed cannot be ruled out.  Since the patient is on Eliquis, short-term imaging would be helpful because she is refusing an MRI which would have been ideal.  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   Recommendations:  Possibility of this being a bleed is low but not 0.  Patient is on Eliquis so I would recommend a MRI to  evaluate further.  Patient did not really want to get an MRI when I spoke with  her over the camera so if she completely refuses that, a short-term CT follow-up in about 4 to 5 hours to make sure that there is no progression of this lesion which would indicate more towards this being a calcification. Size of this bleed is not very big.  Her last dose of Eliquis was this morning.  She is not getting another dose of Eliquis till later tonight.  There is no need to reverse based on the size and the patient's functional status. If on repeat imaging it is deemed that this is a bleed, she will then need admission to [facility] but until then, careful observation in the ER and blood pressure goal of 130-150 should be maintained for a presumptive bleed although once again the possibility of this being a bleed is low and calcification being high but her being on Eliquis and a poor historian, we need to be absolutely sure with repeat imaging. I will not discontinue her Eliquis until we are sure that this is a bleed and in that case the night dose should be held and the patient should be transferred to [facility]. Check UA chest x-ray to make sure she does not have any underlying infections causing strokelike symptoms or stroke mimics  I have discussed my plan in detail with Dr. Jeraldine Loots.  Dr. Estell Harpin is taking over and I am sending him a secure chat message to get back to me with questions if the repeat head CT at around 8 PM reveals any significant abnormalities or concern for bleed.    -- Milon Dikes, MD Neurologist [facility] Pager: [phone]

## 2023-04-04 NOTE — ED Notes (Signed)
Pt cleaned and new brief placed. Call bell within reach.

## 2023-04-04 NOTE — ED Notes (Signed)
Neurologist suggested watch pt for 6 hrs and repeat CT due to pt does not want to do a MRI. Goal for bp 130-150 systolic. PRN meds if needed. If bleed and is on blood thinner pt will go to [facility].

## 2023-04-04 NOTE — ED Provider Notes (Signed)
[place] EMERGENCY DEPARTMENT AT [facility] Provider Note   CSN: [id number] Arrival date & time: 04/04/23  1439  An emergency department physician performed an initial assessment on this suspected stroke patient at 1450.  History  Chief Complaint  Patient presents with   Code Stroke    Krystal Ali is a 78 y.o. female.  HPI Presents from a nursing facility with concern for dysarthria.  Patient has history of multiple medical issues including bedbound status, A-fib for which she takes Eliquis.  Reportedly the patient has had intermittent speech difficulty today, worsening over the past hour and she was designated as a code stroke prior to transport.  Patient is awake, alert, providing her own history, additional details obtained by EMS at bedside.    Home Medications Prior to Admission medications   Medication Sig Start Date End Date Taking? Authorizing Provider  acetaminophen (TYLENOL) 325 MG tablet Take 2 tablets (650 mg total) by mouth every 6 (six) hours as needed for mild pain (or Fever >/= 101). Patient taking differently: Take 650 mg by mouth every 6 (six) hours as needed for mild pain (or Fever >/= 101). Do not exceed 3,000 mg in 24 hrs 09/23/20   Shon Hale, MD  apixaban (ELIQUIS) 5 MG TABS tablet Take 1 tablet (5 mg total) by mouth 2 (two) times daily. 05/29/21   Erick Blinks, MD  blood glucose meter kit and supplies Dispense based on patient and insurance preference. Use up to four times daily as directed. (FOR ICD-10 E10.9, E11.9). 02/15/20   Daryll Drown, NP  ciprofloxacin (CIPRO) 500 MG tablet Take 1 tablet (500 mg total) by mouth every 12 (twelve) hours. 06/19/21   Stoneking, Danford Bad., MD  diclofenac Sodium (VOLTAREN) 1 % GEL Apply 2 g topically in the morning and at bedtime.    [provider]  ferrous sulfate 325 (65 FE) MG tablet Take 325 mg by mouth daily with breakfast.    [provider]  fluticasone (FLONASE) 50 MCG/ACT  nasal spray Place 1 spray into both nostrils 2 (two) times daily. 05/03/21   [provider]  furosemide (LASIX) 40 MG tablet Take 1 tablet (40 mg total) by mouth daily. 05/29/21   Erick Blinks, MD  gabapentin (NEURONTIN) 300 MG capsule Take by mouth.    [provider]  HYDROcodone-acetaminophen (NORCO) 7.5-325 MG tablet Take 1 tablet by mouth in the morning, at noon, in the evening, and at bedtime.    [provider]  INCRUSE ELLIPTA 62.5 MCG/ACT AEPB Inhale 1 puff into the lungs daily. 05/29/21   [provider]  ipratropium-albuterol (DUONEB) 0.5-2.5 (3) MG/3ML SOLN Take 3 mLs by nebulization every 6 (six) hours as needed (SOB).    [provider]  levothyroxine (SYNTHROID) 25 MCG tablet Take 25 mcg by mouth daily. 01/17/22   [provider]  metoprolol tartrate (LOPRESSOR) 25 MG tablet Take 0.5 tablets (12.5 mg total) by mouth 2 (two) times daily. 07/09/22 10/07/22  Mallipeddi, Vishnu P, MD  Multiple Vitamin (MULTIVITAMIN WITH MINERALS) TABS tablet Take 1 tablet by mouth daily.    [provider]  nitrofurantoin, macrocrystal-monohydrate, (MACROBID) 100 MG capsule Take 1 capsule (100 mg total) by mouth every 12 (twelve) hours. 06/19/21   Stoneking, Danford Bad., MD  OXYGEN Inhale 2 L into the lungs as needed (Shortness of Breath).    [provider]  pantoprazole (PROTONIX) 40 MG tablet Take 1 tablet (40 mg total) by mouth 2 (two) times daily.  Patient taking differently: Take 40 mg by mouth daily. 09/23/20 02/13/22  Shon Hale, MD  polyethylene glycol (MIRALAX / GLYCOLAX) 17 g packet Take 17 g by mouth daily.    [provider]  rosuvastatin (CRESTOR) 10 MG tablet Take 1 tablet (10 mg total) by mouth daily. 12/04/21 11/29/22  Meriam Sprague, MD  SPIRIVA HANDIHALER 18 MCG inhalation capsule Place 1 capsule into inhaler and inhale daily. 05/03/21   [provider]  spironolactone (ALDACTONE) 25 MG tablet  Take 0.5 tablets (12.5 mg total) by mouth daily. 05/30/21   Erick Blinks, MD  tamsulosin (FLOMAX) 0.4 MG CAPS capsule Take 1 capsule (0.4 mg total) by mouth daily after supper. 09/23/20   Shon Hale, MD  venlafaxine XR (EFFEXOR-XR) 150 MG 24 hr capsule Take 1 capsule (150 mg total) by mouth daily with breakfast. 07/18/20   Daryll Drown, NP      Allergies    Demerol [meperidine hcl] and Morphine and codeine    Review of Systems   Review of Systems  Physical Exam Updated Vital Signs BP (!) 121/109   Pulse 72   Temp (!) 97.5 F (36.4 C) (Oral)   Resp 19   SpO2 96%  Physical Exam Vitals and nursing note reviewed.  Constitutional:      General: She is not in acute distress.    Appearance: She is well-developed. She is obese.  HENT:     Head: Normocephalic and atraumatic.  Eyes:     Conjunctiva/sclera: Conjunctivae normal.  Cardiovascular:     Rate and Rhythm: Normal rate and regular rhythm.  Pulmonary:     Effort: Pulmonary effort is normal. No respiratory distress.     Breath sounds: Normal breath sounds. No stridor.  Abdominal:     General: There is no distension.  Skin:    General: Skin is warm and dry.  Neurological:     Mental Status: She is alert and oriented to person, place, and time.     Cranial Nerves: No cranial nerve deficit.     Comments: Substantial atrophy in the lower extremities, patient follows commands in the upper extremities.  Face is symmetric, speech is dysarthric.     ED Results / Procedures / Treatments   Labs (all labs ordered are listed, but only abnormal results are displayed) Labs Reviewed  CBG MONITORING, ED - Abnormal; Notable for the following components:      Result Value   Glucose-Capillary 180 (*)    All other components within normal limits  PROTIME-INR  APTT  CBC  DIFFERENTIAL  COMPREHENSIVE METABOLIC PANEL  ETHANOL  RAPID URINE DRUG SCREEN, HOSP PERFORMED  URINALYSIS, ROUTINE W REFLEX MICROSCOPIC     EKG None  Radiology CT HEAD CODE STROKE WO CONTRAST Result Date: 04/04/2023 CLINICAL DATA:  Code stroke. Neuro deficit, acute, stroke suspected. EXAM: CT HEAD WITHOUT CONTRAST TECHNIQUE: Contiguous axial images were obtained from the base of the skull through the vertex without intravenous contrast. RADIATION DOSE REDUCTION: This exam was performed according to the departmental dose-optimization program which includes automated exposure control, adjustment of the mA and/or kV according to patient size and/or use of iterative reconstruction technique. COMPARISON:  None Available. FINDINGS: Brain: Moderate atrophy and diffuse white matter disease is present. Punctate density is present in the posterior left corona radiata on image 23 of series 2 and image 30 of series 5. Cortical hyperdensity is present slightly posterior to this area on the sagittal images. This may represent cortical laminar crow CIS  from a subacute infarct. A remote in from medial right occipital lobe infarct is present. Deep brain nuclei are within normal limits. The ventricles are proportionate to the degree of atrophy. The brainstem and cerebellum are within normal limits. Midline structures are within normal limits. Vascular: Atherosclerotic calcifications are present within the cavernous internal carotid arteries bilaterally. No hyperdense vessel is present. Skull: Calvarium is intact. No focal lytic or blastic lesions are present. No significant extracranial soft tissue lesion is present. Sinuses/Orbits: Minimal fluid is present in the right sphenoid sinus. The paranasal sinuses and mastoid air cells are otherwise clear. Bilateral lens replacements are noted. Globes and orbits are otherwise unremarkable. ASPECTS [facility] Stroke Program Early CT Score) - Ganglionic level infarction (caudate, lentiform nuclei, internal capsule, insula, M1-M3 cortex): 7/7 - Supraganglionic infarction (M4-M6 cortex): 3/3 Total score (0-10 with 10 being  normal): 10/10 IMPRESSION: 1. Punctate density in the posterior left corona radiata with cortical hyperdensity slightly posterior to this area on the sagittal images. 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 Recommend short-term CT of the head to evaluate for stability in 3-6 hours. 2. Moderate atrophy and diffuse white matter disease likely reflects the sequela of chronic microvascular ischemia. 3. Remote medial right occipital lobe infarct. 4. Aspects is 10/10. These results were called by telephone at the time of interpretation on 04/04/2023 at 3:11 pm to provider Dr. Wilford Corner, who verbally acknowledged these results. Electronically Signed   By: Marin Roberts M.D.   On: 04/04/2023 15:11    Procedures Procedures    Medications Ordered in ED Medications - No data to display  ED Course/ Medical Decision Making/ A&P                                 Medical Decision Making Obese adult female anticoagulated presents with new dysarthria.  Patient has exam notable for dysarthria, otherwise seemingly is at baseline.  Patient's vital signs unremarkable, with code stroke designation she had expeditious evaluation with myself and the neurology team.  Stat head CT notable for opacification more consistent with calcification, but concerning for punctate hemorrhage given her use of Eliquis.  Patient returned to room on continuous monitoring with plan for repeat CT in 5 hours. Other additional studies and initially reassuring, additional labs pending.  Pending repeat CT, labs, patient may be appropriate to return to her facility.  Amount and/or Complexity of Data Reviewed Independent Historian: EMS External Data Reviewed: notes. Labs: ordered. Decision-making details documented in ED Course. Radiology: ordered and independent interpretation performed. Decision-making details documented in ED Course. ECG/medicine tests: ordered.  Risk Prescription drug  management. Decision regarding hospitalization. Diagnosis or treatment significantly limited by social determinants of health.   3:26 PM As above, head CT with small opacification.  Dr. Estell Harpin aware.         Final Clinical Impression(s) / ED Diagnoses Final diagnoses:  Dysarthria    Rx / DC Orders ED Discharge Orders     None         Gerhard Munch, MD 04/04/23 1528

## 2023-04-04 NOTE — ED Triage Notes (Signed)
Pt arrived via [facility] from [facility] c/o possible stroke, EMS encoded with a possible stroke and upon arrival to ED, EMS stated pt was a code stroke. Per EMS per [facility], LKW 11:30  am today. R sided upper extremity weakness, R facial droop, and slurred speech, however, facility stated "she is slurred at baseline but she is more slurred than usual" EDP at bedside

## 2023-04-04 NOTE — ED Notes (Signed)
Patient transported to CT

## 2023-04-04 NOTE — ED Provider Notes (Addendum)
Second CT scan shows no change and no bleed.  Patient is discharged back to her nursing home per neurology recommendation   Bethann Berkshire, MD 04/04/23 2049    Bethann Berkshire, MD 04/04/23 2049

## 2023-04-05 DIAGNOSIS — R471 Dysarthria and anarthria: Secondary | ICD-10-CM | POA: Diagnosis not present

## 2023-04-05 MED ORDER — HYDROCODONE-ACETAMINOPHEN 7.5-325 MG PO TABS: 1.0000 | ORAL_TABLET | Freq: Four times a day (QID) | ORAL | Status: DC | PRN

## 2023-04-05 MED ORDER — HYDROCODONE-ACETAMINOPHEN 5-325 MG PO TABS
1.0000 | ORAL_TABLET | Freq: Once | ORAL | Status: AC
  Administered 2023-04-05: 1 via ORAL
  Filled 2023-04-05: qty 1

## 2023-04-05 NOTE — ED Notes (Signed)
Spoke with Luanna Cole at [facility], attempted to call report. I have gotten disconnected twice.

## 2023-04-05 NOTE — ED Notes (Signed)
Family requested to take pt home and felt safe enough to handle transport and transfers.

## 2023-04-09 DIAGNOSIS — R471 Dysarthria and anarthria: Secondary | ICD-10-CM

## 2023-10-31 ENCOUNTER — Encounter: Payer: Self-pay | Admitting: Radiology

## 2024-01-12 ENCOUNTER — Encounter: Payer: Self-pay | Admitting: Radiology
